# Patient Record
Sex: Female | Born: 1995 | Hispanic: Yes | Marital: Married | Smoking: Former smoker
Health system: Southern US, Community
[De-identification: ages and names within clinical notes are randomized; demographics above are authoritative.]

## PROBLEM LIST (undated history)

## (undated) ENCOUNTER — Inpatient Hospital Stay (HOSPITAL_COMMUNITY): Payer: Self-pay

## (undated) ENCOUNTER — Emergency Department (HOSPITAL_COMMUNITY): Admission: EM | Payer: Self-pay

## (undated) DIAGNOSIS — N12 Tubulo-interstitial nephritis, not specified as acute or chronic: Secondary | ICD-10-CM

## (undated) HISTORY — PX: ABDOMINOPLASTY: SUR9

## (undated) HISTORY — PX: DILATION AND CURETTAGE OF UTERUS: SHX78

---

## 2014-04-19 ENCOUNTER — Emergency Department (HOSPITAL_BASED_OUTPATIENT_CLINIC_OR_DEPARTMENT_OTHER)
Admission: EM | Admit: 2014-04-19 | Discharge: 2014-04-19 | Disposition: A | Discharge status: Home / Self Care | Payer: Self-pay | Attending: Emergency Medicine | Admitting: Emergency Medicine

## 2014-04-19 ENCOUNTER — Emergency Department (HOSPITAL_BASED_OUTPATIENT_CLINIC_OR_DEPARTMENT_OTHER): Payer: Self-pay

## 2014-04-19 ENCOUNTER — Encounter (HOSPITAL_BASED_OUTPATIENT_CLINIC_OR_DEPARTMENT_OTHER): Payer: Self-pay | Admitting: *Deleted

## 2014-04-19 DIAGNOSIS — R102 Pelvic and perineal pain: Secondary | ICD-10-CM | POA: Insufficient documentation

## 2014-04-19 DIAGNOSIS — Z3202 Encounter for pregnancy test, result negative: Secondary | ICD-10-CM | POA: Insufficient documentation

## 2014-04-19 LAB — BASIC METABOLIC PANEL
ANION GAP: 14 (ref 5–15)
BUN: 9 mg/dL (ref 6–23)
CALCIUM: 9.6 mg/dL (ref 8.4–10.5)
CO2: 23 mEq/L (ref 19–32)
Chloride: 103 mEq/L (ref 96–112)
Creatinine, Ser: 0.6 mg/dL (ref 0.50–1.10)
Glucose, Bld: 90 mg/dL (ref 70–99)
Potassium: 3.9 mEq/L (ref 3.7–5.3)
Sodium: 140 mEq/L (ref 137–147)

## 2014-04-19 LAB — WET PREP, GENITAL
TRICH WET PREP: NONE SEEN
WBC, Wet Prep HPF POC: NONE SEEN
Yeast Wet Prep HPF POC: NONE SEEN

## 2014-04-19 LAB — URINALYSIS, ROUTINE W REFLEX MICROSCOPIC
BILIRUBIN URINE: NEGATIVE
GLUCOSE, UA: NEGATIVE mg/dL
Hgb urine dipstick: NEGATIVE
Ketones, ur: NEGATIVE mg/dL
LEUKOCYTES UA: NEGATIVE
Nitrite: NEGATIVE
PROTEIN: NEGATIVE mg/dL
Specific Gravity, Urine: 1.012 (ref 1.005–1.030)
Urobilinogen, UA: 0.2 mg/dL (ref 0.0–1.0)
pH: 5.5 (ref 5.0–8.0)

## 2014-04-19 LAB — HCG, SERUM, QUALITATIVE: PREG SERUM: NEGATIVE

## 2014-04-19 LAB — CBC WITH DIFFERENTIAL/PLATELET
BASOS ABS: 0 10*3/uL (ref 0.0–0.1)
Basophils Relative: 0 % (ref 0–1)
EOS ABS: 0.1 10*3/uL (ref 0.0–0.7)
EOS PCT: 1 % (ref 0–5)
HCT: 40.6 % (ref 36.0–46.0)
Hemoglobin: 13.6 g/dL (ref 12.0–15.0)
Lymphocytes Relative: 31 % (ref 12–46)
Lymphs Abs: 2.2 10*3/uL (ref 0.7–4.0)
MCH: 31.3 pg (ref 26.0–34.0)
MCHC: 33.5 g/dL (ref 30.0–36.0)
MCV: 93.3 fL (ref 78.0–100.0)
MONO ABS: 0.5 10*3/uL (ref 0.1–1.0)
MONOS PCT: 7 % (ref 3–12)
NEUTROS ABS: 4.4 10*3/uL (ref 1.7–7.7)
Neutrophils Relative %: 61 % (ref 43–77)
Platelets: 160 10*3/uL (ref 150–400)
RBC: 4.35 MIL/uL (ref 3.87–5.11)
RDW: 12.1 % (ref 11.5–15.5)
WBC: 7.3 10*3/uL (ref 4.0–10.5)

## 2014-04-19 LAB — LIPASE, BLOOD: Lipase: 20 U/L (ref 11–59)

## 2014-04-19 LAB — PREGNANCY, URINE: Preg Test, Ur: NEGATIVE

## 2014-04-19 MED ORDER — AZITHROMYCIN 250 MG PO TABS
1000.0000 mg | ORAL_TABLET | Freq: Once | ORAL | Status: AC
  Administered 2014-04-19: 1000 mg via ORAL
  Filled 2014-04-19: qty 4

## 2014-04-19 MED ORDER — AZITHROMYCIN 250 MG PO TABS
ORAL_TABLET | ORAL | Status: AC
  Filled 2014-04-19: qty 1

## 2014-04-19 MED ORDER — CEFTRIAXONE SODIUM 1 G IJ SOLR
INTRAMUSCULAR | Status: AC
  Filled 2014-04-19: qty 10

## 2014-04-19 MED ORDER — CEFTRIAXONE SODIUM 1 G IJ SOLR
1.0000 g | Freq: Once | INTRAMUSCULAR | Status: AC
  Administered 2014-04-19: 1 g via INTRAVENOUS

## 2014-04-19 NOTE — ED Notes (Addendum)
Dizziness, nausea, vomiting and RLQ pain x  2 weeks

## 2014-04-19 NOTE — ED Provider Notes (Signed)
CSN: [id number]     Arrival date & time 04/19/14  1308 History   First MD Initiated Contact with Patient 04/19/14 1502     Chief Complaint  Patient presents with  . Abdominal Pain   Level 5 caveat- language barrier- history obtained through interpretor.   (Consider location/radiation/quality/duration/timing/severity/associated sxs/prior Treatment) HPI   18 year old female G1 P0 A1 presents today last menstrual period in September complaining of right lower quadrant pain for 2 weeks. She has had associated nausea, vomiting, and lightheadedness. She has had some abnormal vaginal discharge and complains of vaginal pain. The pain as in side. It is worse with movement and walking. She denies any fever or chills. She has vomited 1 time per day. She complains that she feels intermittently lightheaded. She feels like she has been eating more than usual. She has not noted any weight change. She denies urinary tract infection symptoms such as frequency of urination or pain with urination. Sexual partner. She has no history of sexually transmitted diseases. She voices concern that she feels that she must be pregnant. History reviewed. No pertinent past medical history. History reviewed. No pertinent past surgical history. No family history on file. History  Substance Use Topics  . Smoking status: Never Smoker   . Smokeless tobacco: Never Used  . Alcohol Use: Yes     Comment: occasional   OB History    No data available     Review of Systems  All other systems reviewed and are negative.     Allergies  Review of patient's allergies indicates no known allergies.  Home Medications   Prior to Admission medications   Not on File   BP 109/56 mmHg  Pulse 66  Temp(Src) 99 F (37.2 C) (Oral)  Resp 18  Wt 150 lb 6 oz (68.21 kg)  SpO2 99%  LMP 02/08/2014 Physical Exam  Constitutional: She is oriented to person, place, and time. She appears well-developed and well-nourished.  HENT:   Head: Normocephalic and atraumatic.  Right Ear: External ear normal.  Left Ear: External ear normal.  Nose: Nose normal.  Mouth/Throat: Oropharynx is clear and moist.  Eyes: Conjunctivae and EOM are normal. Pupils are equal, round, and reactive to light.  Neck: Normal range of motion. Neck supple. No JVD present. No tracheal deviation present. No thyromegaly present.  Cardiovascular: Normal rate, regular rhythm, normal heart sounds and intact distal pulses.   Pulmonary/Chest: Effort normal and breath sounds normal. She has no wheezes.  Abdominal: Soft. Bowel sounds are normal. She exhibits no mass. There is no tenderness. There is no guarding.  Genitourinary: Vagina normal and uterus normal. Cervix exhibits motion tenderness. Cervix exhibits no discharge and no friability. Right adnexum displays tenderness. No vaginal discharge found.  Musculoskeletal: Normal range of motion.  Lymphadenopathy:    She has no cervical adenopathy.  Neurological: She is alert and oriented to person, place, and time. She has normal reflexes. No cranial nerve deficit or sensory deficit. Gait normal. GCS eye subscore is 4. GCS verbal subscore is 5. GCS motor subscore is 6.  Reflex Scores:      Bicep reflexes are 2+ on the right side and 2+ on the left side.      Patellar reflexes are 2+ on the right side and 2+ on the left side. Strength is 5/5 bilateral elbow flexor/extensors, wrist extension/flexion, intrinsic hand strength equal Bilateral hip flexion/extension 5/5, knee flexion/extension 5/5, ankle 5/5 flexion extension    Skin: Skin is warm and dry.  Psychiatric:  She has a normal mood and affect. Her behavior is normal. Judgment and thought content normal.  Nursing note and vitals reviewed.   ED Course  Procedures (including critical care time) Labs Review Labs Reviewed  WET PREP, GENITAL - Abnormal; Notable for the following:    Clue Cells Wet Prep HPF POC FEW (*)    All other components within  normal limits  GC/CHLAMYDIA PROBE AMP  PREGNANCY, URINE  URINALYSIS, ROUTINE W REFLEX MICROSCOPIC  CBC WITH DIFFERENTIAL  BASIC METABOLIC PANEL  LIPASE, BLOOD  HCG, SERUM, QUALITATIVE    Imaging Review Koreas Transvaginal Non-ob  04/19/2014   CLINICAL DATA:  Right lower quadrant abdominal pain and nausea for 2 weeks; no menstrual periods since September; urine pregnancy test negative today; history of ovarian cysts during adolescence  EXAM: TRANSABDOMINAL AND TRANSVAGINAL ULTRASOUND OF PELVIS  TECHNIQUE: Both transabdominal and transvaginal ultrasound examinations of the pelvis were performed. Transabdominal technique was performed for global imaging of the pelvis including uterus, ovaries, adnexal regions, and pelvic cul-de-sac. It was necessary to proceed with endovaginal exam following the transabdominal exam to visualize the endometrium and ovaries.  COMPARISON:  None  FINDINGS: Uterus  Measurements: 7.9 x 3.3 x 3.9 cm. No fibroids or other mass visualized.  Endometrium  Thickness: 9.2 mm. Within the endometrium there is an anechoic focus with a questionable septation measuring 4 x 2 x 3 mm.  Right ovary  Measurements: 3.4 x 1.7 x 2.2 cm. Normal appearance/no adnexal mass.  Left ovary  Measurements: 3.3 x 1.8 x 1.2 cm. Evaluation of the left ovarian anatomy was limited due to the large amount of bowel gas present.  Other findings  No free fluid.  IMPRESSION: 1. Correlation with a serum beta HCG examination is needed to exclude possible early IUP. There is a tiny fluid-filled structure with a possible internal septation within the endometrial stripe. The endometrial stripe is not abnormally thickened. 2. The right ovary is normal in appearance. The left ovary was largely obscured by bowel gas. No definite adnexal masses are demonstrated. There is no free pelvic fluid. 3. If the patient's serum beta HCG is negative, further evaluation of the pelvis with CT scanning may be useful given the patient's right  lower quadrant pain and other symptoms. Followup pelvic ultrasound in 4-6 weeks can be considered to reassess the endometrium.   Electronically Signed   By: David  SwazilandJordan   On: 04/19/2014 16:52   [place] Pelvis Complete  04/19/2014   CLINICAL DATA:  Right lower quadrant abdominal pain and nausea for 2 weeks; no menstrual periods since September; urine pregnancy test negative today; history of ovarian cysts during adolescence  EXAM: TRANSABDOMINAL AND TRANSVAGINAL ULTRASOUND OF PELVIS  TECHNIQUE: Both transabdominal and transvaginal ultrasound examinations of the pelvis were performed. Transabdominal technique was performed for global imaging of the pelvis including uterus, ovaries, adnexal regions, and pelvic cul-de-sac. It was necessary to proceed with endovaginal exam following the transabdominal exam to visualize the endometrium and ovaries.  COMPARISON:  None  FINDINGS: Uterus  Measurements: 7.9 x 3.3 x 3.9 cm. No fibroids or other mass visualized.  Endometrium  Thickness: 9.2 mm. Within the endometrium there is an anechoic focus with a questionable septation measuring 4 x 2 x 3 mm.  Right ovary  Measurements: 3.4 x 1.7 x 2.2 cm. Normal appearance/no adnexal mass.  Left ovary  Measurements: 3.3 x 1.8 x 1.2 cm. Evaluation of the left ovarian anatomy was limited due to the large amount of bowel gas present.  Other findings  No free fluid.  IMPRESSION: 1. Correlation with a serum beta HCG examination is needed to exclude possible early IUP. There is a tiny fluid-filled structure with a possible internal septation within the endometrial stripe. The endometrial stripe is not abnormally thickened. 2. The right ovary is normal in appearance. The left ovary was largely obscured by bowel gas. No definite adnexal masses are demonstrated. There is no free pelvic fluid. 3. If the patient's serum beta HCG is negative, further evaluation of the pelvis with CT scanning may be useful given the patient's right lower quadrant pain  and other symptoms. Followup pelvic ultrasound in 4-6 weeks can be considered to reassess the endometrium.   Electronically Signed   By: David  SwazilandJordan   On: 04/19/2014 16:52     EKG Interpretation None      MDM   Final diagnoses:  Pelvic pain in female    Patient with negative urine pregnancy negative quantitative hCG. Other labs are normal. Ultrasound of the pelvis shows a tiny fluid-filled structure with a possible internal septation within the endometrial stripe. [facility]. This is explained to the patient and the patient's sister with a friend present through the interpreter and they voice understanding    Hilario Quarryanielle S Aengus Sauceda, MD 04/19/14 1743

## 2014-04-19 NOTE — Discharge Instructions (Signed)
Your pregnancy test is negative.  [facility] for repeat pregnancy test and reevaluation.   Dolor abdominal en las mujeres (Abdominal Pain, Women) El dolor abdominal (en el estmago, la pelvis o el vientre) puede tener muchas causas. Es importante que le informe a su mdico:  La ubicacin del Engineer, miningdolor.  Viene y se va, o persiste todo el tiempo?  Hay situaciones que Location managerinician el dolor (comer ciertos alimentos, la actividad fsica)?  Tiene otros sntomas asociados al dolor (fiebre, nuseas, vmitos, diarrea)? Todo es de gran ayuda cuando se trata de hallar la causa del dolor. CAUSAS  Estmago: Infecciones por virus o bacterias, o lcera.  Intestino: Apendicitis (apndice inflamado), ileitis regional (enfermedad de Crohn), colitis ulcerosa (colon inflamado), sndrome del colon irritable, diverticulitis (inflamacin de los divertculos del colon) o cncer de estmago oo intestino.  Enfermedades de la vescula biliar o clculos.  Enfermedades renales, clculos o infecciones en el rin.  Infeccin o cncer del pncreas.  Fibromialgia (trastorno doloroso)  Enfermedades de los rganos femeninos:  Uterus: tero: fibroma (tumor no canceroso) o infeccin  Trompas de Falopio: infeccin o embarazo ectpico  En los ovarios, quistes o tumores.  Adherencias plvicas (tejido cicatrizal).  Endometriosis (el tejido que cubre el tero se desarrolla en la pelvis y los rganos plvicos).  Sndrome de Agricultural engineercongestin plvica (los rganos femeninos se llenan de sangre antes del periodo menstrual(  Dolor durante el periodo menstrual.  Dolor durante la ovulacin (al producir vulos).  Dolor al usar el DIU (dispositivo intrauterino para el control de la natalidad)  Psychologist, clinicalCncer en los rganos femeninos.  Dolor funcional (no est originado en una enfermedad, puede mejorar sin tratamiento).  Dolor de origen psicolgico  Depresin. DIAGNSTICO Su mdico decidir la  gravedad del dolor a travs del examen fsico  Anlisis de sangre  Radiografas  Ecografas  TC (tomografa computada, tipo especial de radiografas).  IMR (resonancia magntica)  Cultivos, en el caso una infeccin  Colon por enema de bario (se inserta una sustancia de contraste en el intestino grueso para mejorar la observacin con rayos X.)  Colonoscopa (observacin del intestino con un tubo luminoso).  Laparoscopa (examen del interior del abdomen con un tubo que tiene Intel Corporationuna luz).  Ciruga exploratoria abdominal mayor (se observa el abdomen realizando una gran incisin). TRATAMIENTO El tratamiento depender de la causa del problema.   Muchos de estos casos pueden controlarse y tratarse en casa.  Medicamentos de venta libre indicados por el mdico.  Medicamentos con receta.  Antibiticos, en caso de infeccin  Pldoras anticonceptivas, en el caso de perodos dolorosos o dolor al ovular.  Tratamiento hormonal, para la endometriosis  Inyecciones para bloqueo nervioso selectivo.  Fisioterapia.  Antidepresivos.  Consejos por parte de un psclogo o psiquiatra.  Ciruga mayor o menor. INSTRUCCIONES PARA EL CUIDADO DOMICILIARIO  No tome ni administre laxantes a menos que se lo haya indicado su mdico.  Tome analgsicos de venta libre slo si se lo ha indicado el profesional que lo asiste. No tome aspirina, ya que puede causar Apple Computermolestias en el estmago o hemorragias.  Consuma una dieta lquida (caldo o agua) segn lo indicado por el mdico. Progrese lentamente a una dieta blanda, segn la tolerancia, si el dolor se relaciona con el estmago o el intestino.  Tenga un termmetro y tmese la temperatura varias veces al da.  Haga reposo en la cama y Steele Creekduerma, si esto Research scientist (life sciences)alivia el dolor.  Evite las relaciones sexuales, Counsellorsi le producen dolor.  Evite las  situaciones estresantes.  Cumpla con las visitas y los anlisis de control, segn las indicaciones de su mdico.  Si el  dolor no se Burkina Fasoalivia con los medicamentos o la Wanamingociruga, Delawarepuede tratar con:  Acupuntura.  Ejercicios de relajacin (yoga, meditacin).  Terapia grupal.  Psicoterapia. SOLICITE ATENCIN MDICA SI:  Nota que ciertos Pharmacist, communityalimentos le producen dolor de Seventh Mountainestmago.  El tratamiento indicado para Arboriculturistrealizar en el hogar no Marketing executivele alivia el dolor.  Necesita analgsicos ms fuertes.  Quiere que le retiren el DIU.  Si se siente confundido o desfalleciente.  Presenta nuseas o vmitos.  Aparece una erupcin cutnea.  Sufre efectos adversos o una reaccin alrgica debido a los medicamentos que toma. SOLICITE ATENCIN MDICA DE INMEDIATO SI:  El dolor persiste o se agrava.  Tiene fiebre.  Siente el dolor slo en algunos sectores del abdomen. Si se localiza en la zona derecha, posiblemente podra tratarse de apendicitis. En un adulto, si se localiza en la regin inferior izquierda del abdomen, podra tratarse de colitis o diverticulitis.  Hay sangre en las heces (deposiciones de color rojo brillante o negro alquitranado), con o sin vmitos.  Usted presenta sangre en la orina.  Siente escalofros con o sin fiebre.  Se desmaya. ASEGRESE QUE:   Comprende estas instrucciones.  Controlar su enfermedad.  Solicitar ayuda de inmediato si no mejora o si empeora. Document Released: 08/26/2008 Document Revised: 08/02/2011 [facility] Patient Information 2015 [place]ExitCare, [place]LL[place]. This information is not intended to replace advice given to you by your health care provider. Make sure you discuss any questions you have with your health care provider.

## 2014-04-20 LAB — GC/CHLAMYDIA PROBE AMP
CT Probe RNA: NEGATIVE
GC PROBE AMP APTIMA: NEGATIVE

## 2015-09-15 ENCOUNTER — Emergency Department (HOSPITAL_COMMUNITY)
Admission: EM | Admit: 2015-09-15 | Discharge: 2015-09-15 | Disposition: A | Discharge status: Home / Self Care | Payer: Self-pay

## 2015-09-15 NOTE — ED Notes (Signed)
No answer x3

## 2015-09-16 ENCOUNTER — Emergency Department (HOSPITAL_COMMUNITY): Payer: Self-pay

## 2015-09-16 ENCOUNTER — Emergency Department (HOSPITAL_COMMUNITY)
Admission: EM | Admit: 2015-09-16 | Discharge: 2015-09-16 | Disposition: A | Discharge status: Home / Self Care | Payer: Self-pay | Attending: Emergency Medicine | Admitting: Emergency Medicine

## 2015-09-16 ENCOUNTER — Encounter (HOSPITAL_COMMUNITY): Payer: Self-pay

## 2015-09-16 DIAGNOSIS — J069 Acute upper respiratory infection, unspecified: Secondary | ICD-10-CM

## 2015-09-16 DIAGNOSIS — R112 Nausea with vomiting, unspecified: Secondary | ICD-10-CM | POA: Insufficient documentation

## 2015-09-16 DIAGNOSIS — Z3202 Encounter for pregnancy test, result negative: Secondary | ICD-10-CM | POA: Insufficient documentation

## 2015-09-16 DIAGNOSIS — J029 Acute pharyngitis, unspecified: Secondary | ICD-10-CM

## 2015-09-16 DIAGNOSIS — R110 Nausea: Secondary | ICD-10-CM

## 2015-09-16 LAB — I-STAT BETA HCG BLOOD, ED (MC, WL, AP ONLY): I-stat hCG, quantitative: <5 m[IU]/mL (ref <5)

## 2015-09-16 LAB — RAPID STREP SCREEN (MED CTR MEBANE ONLY): Streptococcus, Group A Screen (Direct): NEGATIVE

## 2015-09-16 MED ORDER — ONDANSETRON 4 MG PO TBDP: 4.0000 mg | ORAL_TABLET | Freq: Three times a day (TID) | ORAL | Status: DC | PRN

## 2015-09-16 MED ORDER — CETIRIZINE HCL 10 MG PO TABS: 10.0000 mg | ORAL_TABLET | Freq: Every day | ORAL | Status: DC

## 2015-09-16 MED ORDER — NAPROXEN 250 MG PO TABS: 250.0000 mg | ORAL_TABLET | Freq: Two times a day (BID) | ORAL | Status: DC

## 2015-09-16 MED ORDER — FLUTICASONE PROPIONATE 50 MCG/ACT NA SUSP: 2.0000 | Freq: Every day | NASAL | Status: DC

## 2015-09-16 NOTE — ED Notes (Addendum)
Pt with cough, sore throat, headache since Friday.  Unknown for fever.  No shortness of breath.  Cough with sputum and slight blood tinged  X 1.  Vomiting x 2 - 3 days ago.  No vomiting today.  Pt states her primary reason for being here today is the cough.  Denies abdominal pain, nausea or vomiting today.

## 2015-09-16 NOTE — Discharge Instructions (Signed)
Infeccin del tracto respiratorio superior, adultos (Upper Respiratory Infection, Adult) La mayora de las infecciones del tracto respiratorio superior son infecciones virales de las vas que llevan el aire a los pulmones. Un infeccin del tracto respiratorio superior afecta la nariz, la garganta y las vas respiratorias superiores. El tipo ms frecuente de infeccin del tracto respiratorio superior es la nasofaringitis, que habitualmente se conoce como "resfro comn". Las infecciones del tracto respiratorio superior siguen su curso y por lo general se curan solas. En la [employer], la infeccin del tracto respiratorio superior no requiere atencin [place], Armed forces training and education officer a veces, despus de una infeccin viral, puede surgir una infeccin bacteriana en las vas respiratorias superiores. Esto se conoce como infeccin secundaria. Las infecciones sinusales y en el odo medio son tipos frecuentes de infecciones secundarias en el tracto respiratorio superior. La neumona bacteriana tambin puede complicar un cuadro de infeccin del tracto respiratorio superior. Este tipo de infeccin puede empeorar el asma y la enfermedad pulmonar obstructiva crnica (EPOC). En algunos casos, estas complicaciones pueden requerir atencin mdica de emergencia y poner en peligro la vida.  CAUSAS Casi todas las infecciones del tracto respiratorio superior se deben a los virus. Un virus es un tipo de microbio que puede contagiarse de Ardelia Mems persona a Theatre manager.  FACTORES DE RIESGO Puede estar en riesgo de sufrir una infeccin del tracto respiratorio superior si:   Fuma.  Tiene una enfermedad pulmonar o cardaca crnica.  Tiene debilitado el sistema de defensa (inmunitario) del cuerpo.  Es muy joven o de edad muy [place].  Tiene asma o alergias nasales.  Trabaja en reas donde hay mucha gente o poca ventilacin.  Governor Rooks en una escuela o en un centro de atencin mdica. SIGNOS Y SNTOMAS  Habitualmente, los sntomas aparecen  de 2a 3das despus de entrar en contacto con el virus del resfro. La mayora de las infecciones virales en el tracto respiratorio superior duran de 7a 10das. Sin embargo, las infecciones virales en el tracto respiratorio superior a causa del virus de la gripe pueden durar de 14a 18das y, habitualmente, son ms graves. Entre los sntomas se pueden incluir los siguientes:   Secrecin o congestin nasal.  Estornudos.  Tos.  Dolor de Investment banker, operational.  Dolor de [place].  Fatiga.  Cristy Hilts.  Prdida del apetito.  Dolor en la frente, detrs de los ojos y por encima de los pmulos (dolor sinusal).  Dolores musculares. DIAGNSTICO  El mdico puede diagnosticar una infeccin del tracto respiratorio superior mediante los siguientes estudios:  Examen fsico.  Pruebas para verificar si los sntomas no se deben a otra afeccin, por ejemplo:  Faringitis estreptoccica.  Sinusitis.  Neumona.  Asma. TRATAMIENTO  Esta infeccin desaparece sola, con el tiempo. No puede curarse con medicamentos, pero a menudo se prescriben para aliviar los sntomas. Los medicamentos pueden ser tiles para lo siguiente:  Engineer, materials fiebre.  Reducir la tos.  Aliviar la congestin nasal. INSTRUCCIONES PARA EL CUIDADO EN EL HOGAR   Tome los medicamentos solamente como se lo haya indicado el mdico.  A fin de Best boy de garganta, haga grgaras con solucin salina templada o consuma caramelos para la tos, como se lo haya indicado el mdico.  Use un humidificador de vapor clido o inhale el vapor de la ducha para aumentar la humedad del aire. Esto facilitar la respiracin.  Beba suficiente lquido para Consulting civil engineer orina clara o de color amarillo plido.  Consuma sopas y otros caldos transparentes, y [employer].  Descanse todo lo  que sea necesario.  Regrese al Aleen Campitrabajo cuando la temperatura se le haya normalizado o cuando el mdico lo autorice. Es posible que deba quedarse en su casa durante  un tiempo prolongado, para no infectar a los dems. Tambin puede usar un barbijo y lavarse las manos con cuidado para Transport plannerevitar la propagacin del virus.  Aumente el uso del inhalador si tiene asma.  No consuma ningn producto que contenga tabaco, lo que incluye cigarrillos, tabaco de Theatre managermascar o Administrator, Civil Servicecigarrillos electrnicos. Si necesita ayuda para dejar de fumar, consulte al American Expressmdico. PREVENCIN  La mejor manera de protegerse de un resfro es mantener una higiene Jacksonadecuada.   Evite el contacto oral o fsico con personas que tengan sntomas de resfro.  En caso de contacto, lvese las manos con frecuencia. No hay pruebas claras de que la vitaminaC, la vitaminaE, la equincea o el ejercicio reduzcan la probabilidad de Primary school teachercontraer un resfro. Sin embargo, siempre se recomienda Insurance account managerdescansar mucho, hacer ejercicio y Engineering geologistalimentarse bien.  SOLICITE ATENCIN MDICA SI:   Su estado empeora en lugar de mejorar.  Los medicamentos no Estate agentlogran controlar los sntomas.  Tiene escalofros.  La sensacin de falta de aire empeora.  Tiene mucosidad marrn o roja.  Tiene secrecin nasal amarilla o marrn.  Le duele la cara, especialmente al inclinarse hacia adelante.  Tiene fiebre.  Tiene los ganglios del cuello hinchados.  Siente dolor al tragar.  Tiene zonas blancas en la parte de atrs de la garganta. SOLICITE ATENCIN MDICA DE INMEDIATO SI:   Tiene sntomas intensos o persistentes de:  Dolor de Turkmenistancabeza.  Dolor de odos.  Dolor sinusal.  Dolor en el pecho.  Tiene enfermedad pulmonar crnica y cualquiera de estos sntomas:  Sibilancias.  Tos prolongada.  Tos con sangre.  Cambio en la mucosidad habitual.  Presenta rigidez en el cuello.  Tiene cambios en:  La visin.  La audicin.  El pensamiento.  El Roverestado de nimo. ASEGRESE DE QUE:   Comprende estas instrucciones.  Controlar su afeccin.  Recibir ayuda de inmediato si no mejora o si empeora.   Esta informacin no tiene Public house managercomo  fin reemplazar el consejo del mdico. Asegrese de hacerle al mdico cualquier pregunta que tenga.   Document Released: 02/17/2005 Document Revised: 09/24/2014 Elsevier Interactive Patient Education 2016 [employer] Faringitis (Pharyngitis) La faringitis ocurre cuando la faringe presenta enrojecimiento, dolor e hinchazn (inflamacin).  CAUSAS  Normalmente, la faringitis se debe a una infeccin. Generalmente, estas infecciones ocurren debido a virus (viral) y se presentan cuando las personas se resfran. Sin embargo, a Advertising account executiveveces la faringitis es provocada por bacterias (bacteriana). Las alergias tambin pueden ser una causa de la faringitis. La faringitis viral se puede contagiar de Neomia Dearuna persona a otra al toser, estornudar y compartir objetos o utensilios personales (tazas, tenedores, cucharas, cepillos de diente). La faringitis bacteriana se puede contagiar de Neomia Dearuna persona a otra a travs de un contacto ms ntimo, como besar.  SIGNOS Y SNTOMAS  Los sntomas de la faringitis incluyen los siguientes:   Dolor de Advertising copywritergarganta.  Cansancio (fatiga).  Fiebre no muy elevada.  Dolor de Turkmenistancabeza.  Dolores musculares y en las articulaciones.  Erupciones cutneas  Ganglios linfticos hinchados.  Una pelcula parecida a las placas en la garganta o las amgdalas (frecuente con la faringitis bacteriana). DIAGNSTICO  El mdico le har preguntas sobre la enfermedad y sus sntomas. Normalmente, todo lo que se necesita para diagnosticar una faringitis son sus antecedentes mdicos y un examen fsico. A veces se realiza una prueba rpida para estreptococos.  Tambin es posible que se realicen otros anlisis de laboratorio, segn la posible causa.  TRATAMIENTO  La faringitis viral normalmente mejorar en un plazo de 3 a 4das sin medicamentos. La faringitis bacteriana se trata con medicamentos que [employer] grmenes (antibiticos).  INSTRUCCIONES PARA EL CUIDADO EN EL HOGAR   Beba gran cantidad de lquido para  mantener la orina de tono claro o color amarillo plido.  Tome solo medicamentos de venta libre o recetados, segn las indicaciones del mdico.  Si le receta antibiticos, asegrese de terminarlos, incluso si comienza a Actor.  No tome aspirina.  Descanse lo suficiente.  Hgase grgaras con 8onzas ( ) de agua con sal (cucharadita de sal por litro de agua) cada 1 o 2horas para Science writer.  Puede usar pastillas (si no corre riesgo de Health visitor) o aerosoles para Science writer. SOLICITE ATENCIN MDICA SI:   Tiene bultos grandes y dolorosos en el cuello.  Tiene una erupcin cutnea.  Cuando tose elimina una expectoracin verde, amarillo amarronado o con [place]. SOLICITE ATENCIN MDICA DE INMEDIATO SI:   El cuello se pone rgido.  Comienza a babear o no puede tragar lquidos.  Vomita o no puede retener los [employer] lquidos.  Siente un dolor intenso que no se alivia con los medicamentos recomendados.  Tiene dificultades para respirar (y no debido a la nariz tapada). ASEGRESE DE QUE:   Comprende estas instrucciones.  Controlar su afeccin.  Recibir ayuda de inmediato si no mejora o si empeora.   Esta informacin no tiene Theme park manager el consejo del mdico. Asegrese de hacerle al mdico cualquier pregunta que tenga.   Document Released: 02/17/2005 Document Revised: 02/28/2013 Elsevier Interactive Patient Education [employer]

## 2015-09-16 NOTE — ED Provider Notes (Signed)
CSN: [id number]     Arrival date & time 09/16/15  1218 History  By signing my name below, I, Freida Busman, attest that this documentation has been prepared under the direction and in the presence of non-physician practitioner, Everlene Farrier, PA-C. Electronically Signed: Freida Busman, Scribe. 09/16/2015. 2:17 PM.    Chief Complaint  Patient presents with  . Sore Throat  . Cough    The history is provided by the patient. The history is limited by a language barrier. A language interpreter was used.   HPI Comments:  Paula Gamble is a 20 y.o. female who presents to the Emergency Department complaining of persistent cough x 5 days. Pt reports associated sore throat, nasal congestion, sneezing, and rhinorrhea. She also reports some intermittent nausea and vomiting. She last vomited last night.  No abdominal pain. She denies SOB, fever, postnasal drip, diarrhea, vaginal bleeding/discharge and abdominal pain. No alleviating factors noted. Pt does not speak english. LNMP 08/06/15.   History reviewed. No pertinent past medical history. History reviewed. No pertinent past surgical history. History reviewed. No pertinent family history. Social History  Substance Use Topics  . Smoking status: Never Smoker   . Smokeless tobacco: Never Used  . Alcohol Use: Yes     Comment: occasional   OB History    No data available     Review of Systems  Constitutional: Negative for fever and chills.  HENT: Positive for congestion, rhinorrhea, sneezing and sore throat. Negative for drooling, ear pain, mouth sores, postnasal drip and trouble swallowing.   Eyes: Negative for visual disturbance.  Respiratory: Positive for cough. Negative for shortness of breath and wheezing.   Cardiovascular: Negative for chest pain.  Gastrointestinal: Positive for nausea and vomiting. Negative for abdominal pain and diarrhea.  Genitourinary: Negative for dysuria, urgency, frequency and difficulty urinating.   Musculoskeletal: Negative for neck pain and neck stiffness.  Skin: Negative for rash.  Neurological: Negative for syncope, weakness and headaches.   Allergies  Review of patient's allergies indicates no known allergies.  Home Medications   Prior to Admission medications   Medication Sig Start Date End Date Taking? Authorizing Provider  cetirizine (ZYRTEC ALLERGY) 10 MG tablet Take 1 tablet (10 mg total) by mouth daily. 09/16/15   Everlene Farrier, PA-C  fluticasone (FLONASE) 50 MCG/ACT nasal spray Place 2 sprays into both nostrils daily. 09/16/15   Everlene Farrier, PA-C  naproxen (NAPROSYN) 250 MG tablet Take 1 tablet (250 mg total) by mouth 2 (two) times daily with a meal. 09/16/15   Everlene Farrier, PA-C  ondansetron (ZOFRAN ODT) 4 MG disintegrating tablet Take 1 tablet (4 mg total) by mouth every 8 (eight) hours as needed for nausea or vomiting. 09/16/15   Everlene Farrier, PA-C   BP 107/59 mmHg  Pulse 73  Temp(Src) 98.7 F (37.1 C) (Oral)  Resp 18  Wt 68.04 kg  SpO2 96%  LMP 08/06/2015 Physical Exam  Constitutional: She appears well-developed and well-nourished. No distress.  Nontoxic appearing.  HENT:  Head: Normocephalic and atraumatic.  Right Ear: Tympanic membrane and external ear normal.  Left Ear: Tympanic membrane and external ear normal.  Mouth/Throat: Uvula is midline. No trismus in the jaw. Posterior oropharyngeal erythema present. No oropharyngeal exudate.  Mild bilateral tonsillar hypertrophy without exudate  Mild posterior oropharyngeal erythema and cobblestoning with evidence of postnasal drip No trismus, drooling, or peritonsillar abscess Boggy nasal turbinates bilaterally  Bilateral tympanic membranes are pearly-gray without erythema or loss of landmarks.   Eyes: Conjunctivae are normal. Pupils  are equal, round, and reactive to light. Right eye exhibits no discharge. Left eye exhibits no discharge.  Neck: Normal range of motion. Neck supple. No JVD present. No  tracheal deviation present.  Cardiovascular: Normal rate, regular rhythm, normal heart sounds and intact distal pulses.   Pulmonary/Chest: Effort normal and breath sounds normal. No stridor. No respiratory distress. She has no wheezes. She has no rales.  Lungs are clear to auscultation bilaterally.  Abdominal: Soft. Bowel sounds are normal. She exhibits no distension. There is no tenderness. There is no rebound and no guarding.  Abdomen is soft and nontender to palpation. No peritoneal signs.  Musculoskeletal: Normal range of motion. She exhibits no edema.  Lymphadenopathy:    She has no cervical adenopathy.  Neurological: She is alert. Coordination normal.  Skin: Skin is warm and dry. No rash noted. She is not diaphoretic. No erythema. No pallor.  Psychiatric: She has a normal mood and affect. Her behavior is normal.  Nursing note and vitals reviewed.   ED Course  Procedures DIAGNOSTIC STUDIES:  Oxygen Saturation is 96% on RA, normal by my interpretation.    COORDINATION OF CARE:  2:11 PM Discussed treatment plan with pt at bedside and pt agreed to plan.  Labs Review Labs Reviewed  R[facility] ST[facility] SCREEN [facility])  CULTURE, GROUP A STREP ([facility])  I-STAT BETA HCG BLOOD, ED ([facility], [facility], [facility] ONLY)    Imaging Review Dg Chest 2 View  09/16/2015  CLINICAL DATA:  Cough EXAM: CHEST  2 VIEW COMPARISON:  None. FINDINGS: Cardiomediastinal silhouette is unremarkable. No acute infiltrate or pleural effusion. No pulmonary edema. Bony thorax is unremarkable. IMPRESSION: No active cardiopulmonary disease. Electronically Signed   By: Natasha MeadLiviu  Pop M.D.   On: 09/16/2015 13:24   I have personally reviewed and evaluated these images and lab results as part of my medical decision-making.   EKG Interpretation None      Filed Vitals:   09/16/15 1249  BP: 107/59  Pulse: 73  Temp: 98.7 F (37.1 C)  TempSrc: Oral  Resp: 18  Weight: 68.04 kg  SpO2: 96%     MDM   Meds given in ED:  Medications  - No data to display  New Prescriptions   CETIRIZINE (ZYRTEC ALLERGY) 10 MG TABLET    Take 1 tablet (10 mg total) by mouth daily.   FLUTICASONE (FLONASE) 50 MCG/ACT NASAL SPRAY    Place 2 sprays into both nostrils daily.   NAPROXEN (NAPROSYN) 250 MG TABLET    Take 1 tablet (250 mg total) by mouth 2 (two) times daily with a meal.   ONDANSETRON (ZOFRAN ODT) 4 MG DISINTEGRATING TABLET    Take 1 tablet (4 mg total) by mouth every 8 (eight) hours as needed for nausea or vomiting.    Final diagnoses:  URI (upper respiratory infection)  Sore throat  Nausea   This  is a 20 y.o. female who presents to the Emergency Department complaining of persistent cough x 5 days. Pt reports associated sore throat, nasal congestion, sneezing, and rhinorrhea. She also reports some intermittent nausea and vomiting. She last vomited last night.  No abdominal pain. She denies SOB, fever.  On exam the patient is afebrile and nontoxic appearing. She has boggy nasal turbinates bilaterally. She is mild bilateral tonsillar aperture without exudates. She is evidence of postnasal drip and her oropharynx. No drooling. No peritonsillar abscess. Abdomen is soft and nontender to palpation. Lungs are clear to auscultation bilaterally. Chest x-ray is unremarkable. Rapid  strep is negative. Patient tells me she is late on her menstrual cycle by about 2 weeks. Will check an i-STAT hCG. This could contribute to her nausea. However, I suspect this is more related to her postnasal drip. I-STAT hCG is negative. At re-evaluation patient is tolerating water and graham crackers. Will discharge with prescriptions for naproxen, Flonase, Zyrtec and Zofran for any further nausea. Her abdomen is soft and nontender to palpation. I suspect the nausea is more related to her postnasal drip. I discussed return precautions. I advised the patient to follow-up with their primary care provider this week. I advised the patient to return to the emergency  department with new or worsening symptoms or new concerns. The patient verbalized understanding and agreement with plan.    I personally performed the services described in this documentation, which was scribed in my presence. The recorded information has been reviewed and is accurate.        Everlene Farrier, PA-C 09/16/15 1522  Jerelyn Scott, MD 09/16/15 [id number]

## 2015-09-19 LAB — CULTURE, GROUP A STREP (THRC)

## 2015-11-14 ENCOUNTER — Ambulatory Visit (INDEPENDENT_AMBULATORY_CARE_PROVIDER_SITE_OTHER): Payer: Self-pay

## 2015-11-14 DIAGNOSIS — Z3202 Encounter for pregnancy test, result negative: Secondary | ICD-10-CM

## 2015-11-14 LAB — POCT PREGNANCY, URINE
PREG TEST UR: NEGATIVE
Preg Test, Ur: NEGATIVE

## 2015-11-14 NOTE — Progress Notes (Signed)
Patient comes into the clinic today for a pregnancy test. Test is negative at this time. Patient stated she has miss her period for three months now and wanted to know why. I explained to patient we would need to make her an appointment so that this can be discuss with a provider. Pt verbalizes understanding and she will follow up with an appointment at our office.

## 2015-12-05 ENCOUNTER — Encounter (HOSPITAL_COMMUNITY): Payer: Self-pay | Admitting: Obstetrics and Gynecology

## 2015-12-05 ENCOUNTER — Inpatient Hospital Stay (HOSPITAL_COMMUNITY)
Admission: AD | Admit: 2015-12-05 | Discharge: 2015-12-05 | Disposition: A | Discharge status: Home / Self Care | Payer: Self-pay | Source: Ambulatory Visit | Attending: Obstetrics & Gynecology | Admitting: Obstetrics & Gynecology

## 2015-12-05 DIAGNOSIS — N760 Acute vaginitis: Secondary | ICD-10-CM

## 2015-12-05 DIAGNOSIS — B373 Candidiasis of vulva and vagina: Secondary | ICD-10-CM

## 2015-12-05 DIAGNOSIS — B9689 Other specified bacterial agents as the cause of diseases classified elsewhere: Secondary | ICD-10-CM

## 2015-12-05 DIAGNOSIS — B3731 Acute candidiasis of vulva and vagina: Secondary | ICD-10-CM

## 2015-12-05 DIAGNOSIS — N12 Tubulo-interstitial nephritis, not specified as acute or chronic: Secondary | ICD-10-CM | POA: Insufficient documentation

## 2015-12-05 DIAGNOSIS — R102 Pelvic and perineal pain: Secondary | ICD-10-CM

## 2015-12-05 LAB — POCT PREGNANCY, URINE: Preg Test, Ur: NEGATIVE

## 2015-12-05 LAB — CBC
HEMATOCRIT: 39.5 % (ref 36.0–46.0)
HEMOGLOBIN: 13.1 g/dL (ref 12.0–15.0)
MCH: 30.7 pg (ref 26.0–34.0)
MCHC: 33.2 g/dL (ref 30.0–36.0)
MCV: 92.5 fL (ref 78.0–100.0)
Platelets: 195 10*3/uL (ref 150–400)
RBC: 4.27 MIL/uL (ref 3.87–5.11)
RDW: 13 % (ref 11.5–15.5)
WBC: 13.9 10*3/uL — ABNORMAL HIGH (ref 4.0–10.5)

## 2015-12-05 LAB — URINALYSIS, ROUTINE W REFLEX MICROSCOPIC
Bilirubin Urine: NEGATIVE
GLUCOSE, UA: NEGATIVE mg/dL
Ketones, ur: NEGATIVE mg/dL
Nitrite: NEGATIVE
PH: 6 (ref 5.0–8.0)
Protein, ur: 30 mg/dL — AB
Specific Gravity, Urine: 1.01 (ref 1.005–1.030)

## 2015-12-05 LAB — WET PREP, GENITAL
SPERM: NONE SEEN
TRICH WET PREP: NONE SEEN

## 2015-12-05 LAB — URINE MICROSCOPIC-ADD ON

## 2015-12-05 MED ORDER — CIPROFLOXACIN HCL 500 MG PO TABS: 500.0000 mg | ORAL_TABLET | Freq: Two times a day (BID) | ORAL | Status: DC

## 2015-12-05 MED ORDER — METRONIDAZOLE 500 MG PO TABS: 500.0000 mg | ORAL_TABLET | Freq: Two times a day (BID) | ORAL | Status: DC

## 2015-12-05 MED ORDER — CEFTRIAXONE SODIUM 1 G IJ SOLR
1.0000 g | Freq: Once | INTRAMUSCULAR | Status: AC
  Administered 2015-12-05: 1 g via INTRAMUSCULAR
  Filled 2015-12-05: qty 10

## 2015-12-05 MED ORDER — FLUCONAZOLE 150 MG PO TABS: 150.0000 mg | ORAL_TABLET | Freq: Once | ORAL | Status: DC

## 2015-12-05 MED ORDER — KETOROLAC TROMETHAMINE 60 MG/2ML IM SOLN
60.0000 mg | Freq: Once | INTRAMUSCULAR | Status: AC
  Administered 2015-12-05: 60 mg via INTRAMUSCULAR
  Filled 2015-12-05: qty 2

## 2015-12-05 MED ORDER — IBUPROFEN 600 MG PO TABS: 600.0000 mg | ORAL_TABLET | Freq: Four times a day (QID) | ORAL | Status: DC | PRN

## 2015-12-05 NOTE — MAU Provider Note (Signed)
History     CSN: [id number]  Arrival date and time: 12/05/15 [id number]   First Provider Initiated Contact with Patient 12/05/15 1935    Spanish interpreter present  Chief Complaint  Patient presents with  . Flank Pain   HPI Comments: Paula Gamble is a 20 y.o. [id number] presenting with  1 d hx of constant aching pain across lower back radiating to left groin and suprapubic region. She has dysuria, urgency and frequency of urination. She has 1 sexual partner who is new to her. Reports irritative, pruritic vaginal discharge.    Flank Pain This is a new problem. The current episode started yesterday. The problem occurs constantly. The problem has been gradually worsening since onset. The pain is present in the lumbar spine (also left flank and suprapubic). The quality of the pain is described as aching and stabbing. Radiates to: LLQ and suprapubic. The pain is at a severity of 9/10. The pain is severe. The pain is the same all the time. The symptoms are aggravated by twisting. Associated symptoms include abdominal pain and dysuria. Pertinent negatives include no bladder incontinence, fever, headaches or weakness. (Urinary frequency "50" times a day; urinary urgency. Denies hematuria) She has tried analgesics (acetaminophen) for the symptoms. The treatment provided mild relief.    OB History  Gravida Para Term Preterm AB SAB TAB Ectopic Multiple Living  2    2 2         # Outcome Date GA Lbr Len/2nd Weight Sex Delivery Anes PTL Lv  2 SAB           1 SAB                 Past Medical History  Diagnosis Date  . Medical history non-contributory   Neg for kidney stones or UTIs  Past Surgical History  Procedure Laterality Date  . No past surgeries      History reviewed. No pertinent family history.  Social History  Substance Use Topics  . Smoking status: Never Smoker   . Smokeless tobacco: Never Used  . Alcohol Use: Yes     Comment: occasional    Allergies: No Known  Allergies  Prescriptions prior to admission  Medication Sig Dispense Refill Last Dose  . cetirizine (ZYRTEC ALLERGY) 10 MG tablet Take 1 tablet (10 mg total) by mouth daily. (Patient not taking: Reported on 11/14/2015) 30 tablet 1 Not Taking  . fluticasone (FLONASE) 50 MCG/ACT nasal spray Place 2 sprays into both nostrils daily. (Patient not taking: Reported on 11/14/2015) 16 g 0 Not Taking  . naproxen (NAPROSYN) 250 MG tablet Take 1 tablet (250 mg total) by mouth 2 (two) times daily with a meal. (Patient not taking: Reported on 11/14/2015) 30 tablet 0 Not Taking  . ondansetron (ZOFRAN ODT) 4 MG disintegrating tablet Take 1 tablet (4 mg total) by mouth every 8 (eight) hours as needed for nausea or vomiting. (Patient not taking: Reported on 11/14/2015) 10 tablet 0 Not Taking    Review of Systems  Constitutional: Positive for malaise/fatigue. Negative for fever, chills and diaphoresis.  Gastrointestinal: Positive for abdominal pain.  Genitourinary: Positive for dysuria and flank pain. Negative for bladder incontinence.  Neurological: Negative for weakness and headaches.   Physical Exam   Blood pressure 119/68, pulse 74, temperature 98.7 F (37.1 C), resp. rate 18, height 5\' 3"  (1.6 m), weight 71.251 kg (157 lb 1.3 oz), last menstrual period 11/10/2015.  Physical Exam  Nursing note and vitals reviewed. Constitutional: She is oriented to  person, place, and time. She appears well-developed and well-nourished. She appears distressed.  Apparent pain with repositioning  HENT:  Head: Normocephalic.  Eyes: Pupils are equal, round, and reactive to light.  Neck: Normal range of motion.  Cardiovascular: Normal rate.   Respiratory: Effort normal.  GI: Soft. There is no tenderness.  Genitourinary: Uterus normal. Vaginal discharge found.  White-gray scanty discharge  Musculoskeletal: Normal range of motion.  Diffuse TTP left and right lower back Left CVAT  Neurological: She is alert and oriented to  person, place, and time.  Skin: Skin is warm and dry.  Psychiatric: She has a normal mood and affect. Her behavior is normal.    MAU Course  Procedures Results for orders placed or performed during the hospital encounter of 12/05/15 (from the past 24 hour(s))  Urinalysis, Routine w reflex microscopic (not at [facility])     Status: Abnormal   Collection Time: 12/05/15  7:23 PM  Result Value Ref Range   Color, Urine YELLOW YELLOW   APPearance CLOUDY (A) CLEAR   Specific Gravity, Urine 1.010 1.005 - 1.030   pH 6.0 5.0 - 8.0   Glucose, UA NEGATIVE NEGATIVE mg/dL   Hgb urine dipstick LARGE (A) NEGATIVE   Bilirubin Urine NEGATIVE NEGATIVE   Ketones, ur NEGATIVE NEGATIVE mg/dL   Protein, ur 30 (A) NEGATIVE mg/dL   Nitrite NEGATIVE NEGATIVE   Leukocytes, UA MODERATE (A) NEGATIVE  Urine microscopic-add on     Status: Abnormal   Collection Time: 12/05/15  7:23 PM  Result Value Ref Range   Squamous Epithelial / LPF 0-5 (A) NONE SEEN   WBC, UA 6-30 0 - 5 WBC/hpf   RBC / HPF 6-30 0 - 5 RBC/hpf   Bacteria, UA FEW (A) NONE SEEN  Pregnancy, urine POC     Status: None   Collection Time: 12/05/15  7:43 PM  Result Value Ref Range   Preg Test, Ur NEGATIVE NEGATIVE  Wet prep, genital     Status: Abnormal   Collection Time: 12/05/15  7:50 PM  Result Value Ref Range   Yeast Wet Prep HPF POC PRESENT (A) NONE SEEN   Trich, Wet Prep NONE SEEN NONE SEEN   Clue Cells Wet Prep HPF POC PRESENT (A) NONE SEEN   WBC, Wet Prep HPF POC MANY (A) NONE SEEN   Sperm NONE SEEN   CBC     Status: Abnormal   Collection Time: 12/05/15  8:05 PM  Result Value Ref Range   WBC 13.9 (H) 4.0 - 10.5 K/uL   RBC 4.27 3.87 - 5.11 MIL/uL   Hemoglobin 13.1 12.0 - 15.0 g/dL   HCT 16.1 09.6 - 04.5 %   MCV 92.5 78.0 - 100.0 fL   MCH 30.7 26.0 - 34.0 pg   MCHC 33.2 30.0 - 36.0 g/dL   RDW 40.9 81.1 - 91.4 %   Platelets 195 150 - 400 K/uL    GC/CT, HIV and urine culture sent  Toradol 60mg  IM given> much improved  MDM:  History of new sexual partner UTI symptom complex consistent with pyelo; doubt ureteral stones. Consulted with Dr. Erin Fulling Will give 1 dose Ceftriaxone !gm IM and then Cipro course  Assessment and Plan   1. Pyelonephritis      Medication List    STOP taking these medications        acetaminophen 325 MG tablet  Commonly known as:  TYLENOL     cetirizine 10 MG tablet  Commonly known as:  ZYRTEC ALLERGY  fluticasone 50 MCG/ACT nasal spray  Commonly known as:  FLONASE     naproxen 250 MG tablet  Commonly known as:  NAPROSYN     ondansetron 4 MG disintegrating tablet  Commonly known as:  ZOFRAN ODT      TAKE these medications        AZO TABS PO  Take 2 tablets by mouth 2 (two) times daily as needed (For urinary pain.).     ciprofloxacin 500 MG tablet  Commonly known as:  CIPRO  Take 1 tablet (500 mg total) by mouth 2 (two) times daily.     fluconazole 150 MG tablet  Commonly known as:  DIFLUCAN  Take 1 tablet (150 mg total) by mouth once.     ibuprofen 600 MG tablet  Commonly known as:  ADVIL,MOTRIN  Take 1 tablet (600 mg total) by mouth every 6 (six) hours as needed.     levonorgestrel-ethinyl estradiol 0.15-0.03 MG tablet  Commonly known as:  SEASONALE,INTROVALE,JOLESSA  Take 1 tablet by mouth daily.     metroNIDAZOLE 500 MG tablet  Commonly known as:  FLAGYL  Take 1 tablet (500 mg total) by mouth 2 (two) times daily.      Note to [facility] clinical pool to call in 48-72 hrs and be seen if not improved Follow-up Information    Follow up with [facility]. Schedule an appointment as soon as possible for a visit in 3 days.   Specialty:  Obstetrics and Gynecology   Why:  If symptoms worsen   Contact information:   [address] [place] [address] [phone]     Shelbey Spindler 12/05/2015, 7:35 PM

## 2015-12-05 NOTE — Discharge Instructions (Signed)
Pielonefritis en los adultos °(Pyelonephritis, Adult) °La pielonefritis es una infección del riñón. Los riñones son los órganos que filtran la sangre y eliminan los residuos del torrente sanguíneo a través de la orina. La orina pasa desde los riñones, a través de los uréteres, hacia la vejiga. Hay dos tipos principales de pielonefritis: °· Infecciones que se inician rápidamente sin síntomas previos (pielonefritis aguda). °· Infecciones que persisten durante un período prolongado (pielonefritis crónica). °En la mayoría de los casos, la infección desaparece con el tratamiento y no causa otros problemas. Las infecciones más graves o crónicas a veces pueden propagarse al torrente sanguíneo u ocasionar otros problemas en los riñones. °CAUSAS °Por lo general, entre las causas de esta afección, se incluyen las siguientes: °· Bacterias que pasan desde la vejiga al riñón a través de la orina infectada. La orina de la vejiga puede infectarse por bacterias relacionadas con estas causas: °¨ Infección en la vejiga (cistitis). °¨ Inflamación de la próstata (prostatitis). °¨ Relaciones sexuales en las mujeres. °· Bacterias que pasan del torrente sanguíneo al riñón. °FACTORES DE RIESGO °Es más probable que esta afección se manifieste en: °· Las embarazadas. °· Las personas de edad avanzada. °· Los diabéticos. °· Las personas que tienen cálculos en los riñones o la vejiga. °· Las personas que tienen otras anomalías en el riñón o los uréteres. °· Las personas que tienen una sonda vesical. °· Las personas con cáncer. °· Las personas que son sexualmente activas. °· Las mujeres que usan espermicidas. °· Las personas que han tenido una infección previa en las vías urinarias. °SÍNTOMAS °Los síntomas de esta afección incluyen lo siguiente: °· Ganas frecuentes de orinar. °· Necesidad intensa o persistente de orinar. °· Sensación de ardor o escozor al orinar. °· Dolor abdominal. °· Dolor de espalda. °· Dolor al costado del cuerpo o en la  fosa lumbar. °· Fiebre. °· Escalofríos. °· Sangre en la orina u orina oscura. °· Náuseas. °· Vómitos. °DIAGNÓSTICO °Esta afección se puede diagnosticar en función de lo siguiente: °· Examen físico e historia clínica. °· Análisis de orina. °· Análisis de sangre. °También pueden hacerle estudios de diagnóstico por imágenes de los riñones, por ejemplo, una ecografía o una tomografía computarizada. °TRATAMIENTO °El tratamiento de esta afección puede depender de la gravedad de la infección. °· Si la infección es leve y se detecta rápidamente, pueden administrarle antibióticos por vía oral. Deberá tomar líquido para permanecer hidratado. °· Si la infección es más grave, es posible que deban hospitalizarlo para administrarle antibióticos directamente en una vena a través de una vía intravenosa (IV). Quizás también deban administrarle líquidos a través de una vía intravenosa si no se encuentra bien hidratado. Después de la hospitalización, es posible que deba tomar antibióticos durante un tiempo. °Podrán prescribirle otros tratamientos según la causa de la infección. °INSTRUCCIONES PARA EL CUIDADO EN EL HOGAR °Medicamentos °· Tome los medicamentos de venta libre y los recetados solamente como se lo haya indicado el médico. °· Si le recetaron un antibiótico, tómelo como se lo haya indicado el médico. No deje de tomar los antibióticos aunque comience a sentirse mejor. °Instrucciones generales °· Beba suficiente líquido para mantener la orina clara o de color amarillo pálido. °· Evite la cafeína, el té y las bebidas gaseosas. Estas sustancias irritan la vejiga. °· Orine con frecuencia. Evite retener la orina durante largos períodos. °· Orine antes y después de las relaciones sexuales. °· Después de defecar, las mujeres deben higienizarse la región perineal desde adelante hacia atrás. Use cada trozo de   papel higiénico solo una vez. °· Concurra a todas las visitas de control como se lo haya indicado el médico. Esto es  importante. °SOLICITE ATENCIÓN MÉDICA SI: °· Los síntomas no mejoran después de 2 días de tratamiento. °· Los síntomas empeoran. °· Tiene fiebre. °SOLICITE ATENCIÓN MÉDICA DE INMEDIATO SI: °· No puede tomar los antibióticos ni ingerir líquidos. °· Comienza a sentir escalofríos. °· Vomita. °· Siente un dolor intenso en la espalda o en la fosa lumbar. °· Se desmaya o siente una debilidad extrema. °  °Esta información no tiene como fin reemplazar el consejo del médico. Asegúrese de hacerle al médico cualquier pregunta que tenga. °  °Document Released: 02/17/2005 Document Revised: 01/29/2015 °Elsevier Interactive Patient Education ©2016 [employer] ° °

## 2015-12-05 NOTE — MAU Note (Signed)
My kidney is hurting from L flank coming around to front. No vag bleeding or d/c. Dysuria. Pain since yesterday

## 2015-12-05 NOTE — Progress Notes (Signed)
Pt has friend with her who will interpret. Waiver signed. Friend's name is Thomasena EdisSandra Urbieta

## 2015-12-06 LAB — HIV ANTIBODY (ROUTINE TESTING W REFLEX): HIV Screen 4th Generation wRfx: NONREACTIVE

## 2015-12-08 LAB — URINE CULTURE: Culture: >=100000 — AB

## 2015-12-08 LAB — GC/CHLAMYDIA PROBE AMP (~~LOC~~) NOT AT ARMC
CHLAMYDIA, DNA PROBE: NEGATIVE
Neisseria Gonorrhea: NEGATIVE

## 2015-12-10 ENCOUNTER — Telehealth: Payer: Self-pay | Admitting: *Deleted

## 2015-12-10 NOTE — Telephone Encounter (Signed)
Urine culture showed sensitivity to cipro. Called patient today, used Spanish interpreter [phone]. Patient reported she is feeling much better since being on antibiotics. No problems or questions.

## 2015-12-10 NOTE — Telephone Encounter (Signed)
-----   Message from Danae Orleanseirdre C Poe, CNM sent at 12/05/2015  9:21 PM EDT ----- Non-pregnant with pyelo. Speaks Spanish. Could someone check her urine culture result to see if sensitive to Cipro which we prescribed? Please call Mon or Tue to make sure she's better. If not improved she needs to be seen to rule out complicated pyelo or stone.  Thanks (I will be out of town or would do this)

## 2015-12-17 ENCOUNTER — Telehealth: Payer: Self-pay | Admitting: *Deleted

## 2015-12-17 NOTE — Telephone Encounter (Signed)
Per message from [employer]- check urine culture in this non- pregant patient to  make sure is sensitive to Cipro- call patient and make sure she is feeling better.  Per chart review was sensitve to cipro. Called patient with Paula Gamble Interpreter#[id number] and patient states she has not finished taking cipro, but is feeling better and denied any symptoms.  I instructed her to finish cipro to make sure uti goes completely away. I also advised her to call us or go to MAU if symtoms return. She voices understanding.

## 2016-02-26 ENCOUNTER — Encounter (HOSPITAL_COMMUNITY): Payer: Self-pay | Admitting: *Deleted

## 2016-02-26 ENCOUNTER — Inpatient Hospital Stay (HOSPITAL_COMMUNITY)
Admission: AD | Admit: 2016-02-26 | Discharge: 2016-02-26 | Disposition: A | Discharge status: Home / Self Care | Payer: Self-pay | Source: Ambulatory Visit | Attending: Family Medicine | Admitting: Family Medicine

## 2016-02-26 DIAGNOSIS — R109 Unspecified abdominal pain: Secondary | ICD-10-CM | POA: Insufficient documentation

## 2016-02-26 DIAGNOSIS — Z3202 Encounter for pregnancy test, result negative: Secondary | ICD-10-CM | POA: Insufficient documentation

## 2016-02-26 LAB — URINE MICROSCOPIC-ADD ON: Bacteria, UA: NONE SEEN

## 2016-02-26 LAB — CBC
HEMATOCRIT: 41.4 % (ref 36.0–46.0)
HEMOGLOBIN: 14.1 g/dL (ref 12.0–15.0)
MCH: 31.4 pg (ref 26.0–34.0)
MCHC: 34.1 g/dL (ref 30.0–36.0)
MCV: 92.2 fL (ref 78.0–100.0)
Platelets: 220 10*3/uL (ref 150–400)
RBC: 4.49 MIL/uL (ref 3.87–5.11)
RDW: 12.5 % (ref 11.5–15.5)
WBC: 9.1 10*3/uL (ref 4.0–10.5)

## 2016-02-26 LAB — URINALYSIS, ROUTINE W REFLEX MICROSCOPIC
Bilirubin Urine: NEGATIVE
Glucose, UA: NEGATIVE mg/dL
Hgb urine dipstick: NEGATIVE
Ketones, ur: NEGATIVE mg/dL
NITRITE: NEGATIVE
PH: 8 (ref 5.0–8.0)
Protein, ur: NEGATIVE mg/dL
SPECIFIC GRAVITY, URINE: 1.01 (ref 1.005–1.030)

## 2016-02-26 LAB — POCT PREGNANCY, URINE: PREG TEST UR: NEGATIVE

## 2016-02-26 LAB — HCG, QUANTITATIVE, PREGNANCY

## 2016-02-26 NOTE — Discharge Instructions (Signed)
Cuidados prenatales (Prenatal Care) QU SON LOS CUIDADOS PRENATALES?  Los cuidados prenatales son Sandi Mariscal se brindan a una embarazada antes del [place]. Los cuidados prenatales garantizan que la embarazada y el feto estn tan sanos como sea posible durante todo el [place]. Pueden brindar Goodrich Corporation tipo de cuidados [place], un mdico de atencin primaria o un especialista en parto y Psychiatrist ([place]). Los cuidados prenatales incluyen exmenes fsicos, estudios, tratamientos e informacin sobre nutricin, estilo de vida y servicios de apoyo social. POR QU SON TAN IMPORTANTES LOS CUIDADOS PRENATALES?  Los cuidados prenatales recibidos desde un inicio y de forma peridica aumentan la probabilidad de que usted y el beb permanezcan sanos durante todo el [place]. Este tipo de cuidados tambin reduce el riesgo de que el beb nazca mucho antes de la fecha probable de parto (prematuro) o de que sea ms pequeo de lo previsto (pequeo para la edad gestacional).Durante las visitas prenatales, se Engineer, technical sales clase de enfermedad preexistente que usted pueda tener y que represente un riesgo durante el Psychiatrist. Tambin la monitorearn con regularidad para Insurance risk surveyor afeccin que pueda surgir Academic librarian, a fin de tratarla con rapidez y eficacia. QU SUCEDE DURANTE LAS VISITAS PRENATALES? Las visitas prenatales pueden incluir lo siguiente: Dilogo Informe al mdico cualquier signo o sntoma nuevo que haya tenido desde la ltima visita. Estos pueden incluir los siguientes:  Nuseas o vmitos.  Aumento o disminucin del nivel de [place].  Dificultad para dormir.  Dolor en la espalda o las piernas.  Cambios en [employer].  Ganas frecuentes de Geographical information systems officer.  Falta de aire al realizar actividad fsica.  Cambios en la piel, por ejemplo, una erupcin cutnea o picazn.  Sangrado o flujo vaginal.  Sensacin de excitacin o nerviosismo.  Cambios en los movimientos del feto. Es  conveniente que escriba cualquier pregunta o tema del que quiera hablar con el mdico, para llevarlo anotado a la cita. Exmenes Durante la primera visita prenatal, es probable que le hagan un examen fsico completo. El [employer] revisar con frecuencia la vagina, el cuello del tero y la posicin del tero, adems de examinarle el corazn, los pulmones y otras partes del cuerpo. A medida que el embarazo avance, el mdico medir el tamao del tero y IT consultant posicin del feto dentro del tero. Tambin puede examinarla para [employer] primeros signos del [place] de [place]. Las visitas prenatales tambin pueden incluir el control de la presin arterial y, despus de 10 a 12semanas de embarazo, aproximadamente, el control de los latidos del feto. Estudios Los estudios habituales suelen incluir lo siguiente:  Anlisis de [place]. Este anlisis examina la presencia de glucosa, protenas o signos de infeccin en la orina.  Recuento sanguneo. Este anlisis verifica el nivel de glbulos rojos y blancos en el organismo.  Pruebas de enfermedades de transmisin sexual (ETS). Las pruebas de Airline pilot de ETS al comienzo del embarazo son Neomia Dear prctica de rutina, y en muchos estados es obligacin practicarlas.  Anlisis de anticuerpos. La examinarn para ver si es inmune a determinadas enfermedades, como la [place], que puede afectar al feto en desarrollo.  Deteccin de glucosa. Entre la semana 24y 28de embarazo, le analizarn el nivel de glucemia para detectar signos de diabetes gestacional. Pueden recomendarle un anlisis de seguimiento.  Estreptococos del grupoB. Es comn encontrar estas bacterias dentro de la vagina. Este Abbott Laboratories indicar al mdico si necesita darle un antibitico para reducir la cantidad de este tipo de bacterias en el cuerpo antes del trabajo de  parto y [place].  Ecografas. Alrededor de la semana 18a 20de embarazo, muchas embarazadas se hacen ecografas para evaluar la  salud del feto y Engineer, manufacturing cualquier anomala en el desarrollo.  Prueba del VIH (virus de inmunodeficiencia humana). Al comienzo del Psychiatrist, le harn una prueba de deteccin del VIH. Si corre un riesgo alto de [place] VIH, pueden repetirle esta prueba durante el tercer trimestre del embarazo. Pueden indicarle otro tipo de estudios segn su edad, sus antecedentes mdicos personales o familiares, u otros factores.  CON QU FRECUENCIA DEBO VISITAR AL MDICO PARA LOS CUIDADOS PRENATALES? El programa de control correspondiente a los cuidados prenatales depender de cualquier enfermedad que usted tenga desde antes del embarazo o que haya desarrollado durante el mismo. Si usted no tiene Agricultural engineer, es probable que le hagan los siguientes controles:  Physiological scientist vez al mes durante los primeros de [place].  Dos veces al mes durante el sptimo y el octavo mes de [place].  Una vez a la Boston Scientific noveno mes de Psychiatrist y [place]. Si presenta signos de trabajo de parto prematuro u otros signos o sntomas preocupantes, es posible que deba ver al mdico con ms frecuencia. Consulte al H[employer] Inc programa de cuidados prenatales ms adecuado para su caso. QU PUEDO HACER PARA QUE EL BEB Y YO ESTEMOS TAN SANOS COMO SEA POSIBLE DURANTE EL EMBARAZO?  Tome una vitamina prenatal que contenga (0,4mg ) de cido flico CarMax. El mdico tambin puede indicarle que tome vitaminas adicionales, como yodo, vitaminaD, hierro, cobre y zinc.  [place] de 1500 a 2000mg  de Fiserv desde la semana20 de Counsellor.  Asegrese de estar al da con las vacunas. A menos que el mdico le indique otra cosa:  Debe aplicarse la vacuna contra la difteria, el ttanos y la tosferina (Tdap) entre la semana27 y 36de embarazo, independientemente de la fecha en la que recibi la ltima vacuna Tdap. Esta vacuna ayuda a proteger al beb contra la tosferina  despus del nacimiento.  Debe recibir Neomia Dear vacuna antigripal inactivada (IIV) anual como ayuda para protegerlos a usted y al beb de la gripe. Puede recibirla en cualquier momento del embarazo.  Siga una dieta bien equilibrada, que incluya lo siguiente:  Nils Pyle y verduras frescas.  Protenas magras.  Alimentos con FedEx de calcio, [place], [place], quesos duros y verduras de hojas color verde oscuro.  Panes integrales.  No coma frutos de mar con alto contenido de mercurio, por ejemplo:  Pez espada.  [place].  [place].  [place].  Ms de Sabino Snipes de atn por semana.  No coma lo siguiente:  Carnes o huevos crudos o mal cocidos.  Alimentos no pasteurizados, como quesos blandos (brie, [place] o feta), jugos y [place].  Embutidos.  Salchichas que no se cocinaron en agua hirviendo.  Beba suficiente agua para mantener la orina clara o de color amarillo plido. Para muchas mujeres, la cantidad es de 10 o ms vasos de 8onzas de Regulatory affairs officer. El hecho de mantenerse hidratada ayuda a que el feto reciba nutrientes y Network engineer el inicio de contracciones uterinas prematuras.  No consuma ningn producto que contenga tabaco, como cigarrillos, tabaco de Theatre manager o Administrator, Civil Service. Si necesita ayuda para dejar de fumar, consulte al mdico.  No consuma bebidas que contengan alcohol. No se ha determinado que haya un nivel de consumo de alcohol que sea inocuo durante el [place].  No consuma drogas. Estas pueden daar al  feto en desarrollo o causar un aborto espontneo.  Consulte al mdico o al farmacutico antes de tomar cualquier medicamento recetado o de venta libre, hierbas o suplementos.  Limite el consumo de cafena a no ms de 200mg  por da.  Haga actividad fsica. A menos que el mdico le indique otra cosa, intente hacer de ejercicio moderado la mayora de los [address] de la [place]. No practique actividades de alto impacto, deportes de contacto o actividades  con alto riesgo de cadas, como equitacin o esqu extremo.  Descanse lo suficiente.  Evite todo aquello que aumente la temperatura corporal, como jacuzzis y saunas.  Si tiene un gato, no vace la bandeja sanitaria. Las bacterias presentes en las heces del gato pueden causar una infeccin llamada toxoplasmosis. Esta puede daar gravemente al feto.  Aljese de las sustancias qumicas como insecticidas, plomo y [place], y de los productos de limpieza o pinturas que contengan solventes.  No se saque ninguna radiografa, excepto si es necesaria por razones mdicas.  Tome una clase de preparacin para el parto y Mining engineer. Pregntele al mdico si necesita una derivacin o una recomendacin.   Esta informacin no tiene Theme park manager el consejo del mdico. Asegrese de hacerle al mdico cualquier pregunta que tenga.   Document Released: 10/27/2007 Document Revised: 05/31/2014 Elsevier Interactive Patient Education 2016 [employer].  Informacin sobre la prueba de Psychiatrist (Pregnancy Test Information) EN QU CONSISTE UNA PRUEBA DE EMBARAZO? La prueba de embarazo se [place] para Landscape architect presencia de gonadotropina corinica humana [facility]) en Lauris Poag de [place] o de [place]. La [facility] es una hormona que producen las clulas de la placenta. La placenta es el rgano que se forma como apoyo vital del beb en desarrollo y para satisfacer sus necesidades de nutricin. Para esta prueba, se requiere [place] de [place] u [place]. La prueba de embarazo determina si est embarazada o no. CMO SE REALIZAN LAS PRUEBAS DE EMBARAZO? Las pruebas de Psychiatrist se Radiographer, therapeutic mediante una prueba de Psychiatrist casera o un anlisis de sangre u orina que se realiza en el consultorio del mdico.  Las pruebas de Psychiatrist caseras requieren [place] Poag de [place].  La mayora de los kits utilizan un dispositivo de prueba de plstico con una tira reactiva de papel, que indica si hay presencia de [facility] en la  orina.  Siga muy cuidadosamente las instrucciones de la prueba.  Despus de News Corporation varilla de la prueba, aparecern marcas que le informarn si est embarazada o no.  Para obtener Safeco Corporation, use la primera orina de la [place], que es cuando la concentracin de [facility] es ms elevada. El anlisis de sangre para Engineer, manufacturing un Psychiatrist requiere la extraccin de [place] Poag de sangre de una vena de la mano o el brazo. El mdico enviar su Lynder Parents a un laboratorio para su anlisis. Los Norfolk Southern de la prueba de Psychiatrist sern positivos o Audiological scientist. UN TIPO DE PRUEBA DE [facility] ES MEJOR QUE OTRO? En algunos casos, puede suceder que la prueba de [place] arroje un resultado negativo y el anlisis de sangre uno positivo. Esto sucede porque los anlisis de sangre son ms precisos. Esto significa que los anlisis de sangre pueden Landscape architect [facility] antes que las pruebas de embarazo caseras.  QU PRECISIN TIENEN LAS PRUEBAS DE EMBARAZO CASERAS?  Ambos tipos de pruebas de [place] Star son muy precisos.  Un anlisis de sangre tiene una precisin del 98%.  Cuando el embarazo est muy avanzado y si se usan correctamente, las  pruebas de embarazo caseras son igualmente precisas. EXISTEN FACTORES QUE PUEDEN INTERFERIR CON LOS RESULTADOS DE LA PRUEBA DE EMBARAZO?  Es posible que algunas condiciones causen un resultado impreciso (positivo falso o negativofalso).  Un resultado positivo falso es un resultado positivo cuando no est embarazada. Esto puede suceder si usted:  Est tomando determinados medicamentos, incluidos los anticonvulsivos o los tranquilizantes.  Tiene ciertas protenas en la sangre.  Un resultado negativo falso es un resultado negativo cuando efectivamente s est embarazada. Esto puede suceder si usted:  Se realiz la prueba antes de que hubiera suficiente [facility] para Engineer, manufacturingdetectar. En la Lennar Corporationmayora de las mujeres, el resultado de la prueba de Psychiatristembarazo no ser positivo hasta 3 o 4 semanas  despus de la concepcin.  Bebi abundante cantidad de lquido antes de la prueba. Las muestras de [place]ina diluida a veces pueden arrojar un resultado impreciso.  Toma algunos medicamentos, como diurticos o algunos antihistamnicos. QU DEBO HACER SI EL RESULTADO DE MI PRUEBA DE EMBARAZO DA POSITIVO? Si el resultado de su prueba de Psychiatristembarazo es positivo, programe una cita con su mdico. Podra necesitar pruebas adicionales para Production assistant, radioconfirmar el embarazo. Mientras tanto, comience a tomar vitaminas prenatales, deje de fumar y de tomar alcohol, y no consuma drogas. Hable con su mdico acerca de cmo cuidarse durante el embarazo. Pregntele sobre la atencin que necesitar durante todo el Psychiatristembarazo (atencin prenatal).   Esta informacin no tiene Theme park managercomo fin reemplazar el consejo del mdico. Asegrese de hacerle al mdico cualquier pregunta que tenga.   Document Released: 04/26/2012 Document Revised: 05/31/2014 Elsevier Interactive Patient Education Yahoo! Inc2016 Elsevier Inc.

## 2016-02-26 NOTE — MAU Provider Note (Signed)
Patient was brought back to the room and Spanish interpretor was present, patient does not understand any [place]ia[place]. Patient was informed of a negative pregnancy test. She desires pregnancy, and is off birth control. She refused to be seen for the abdominal pain due to not being bad enough for examination, she mainly came for pregnancy test. She was encouraged to get OTC prenatal vitamins from any pharmacy and start taking today.  Questions were answered to her satisfaction, her partner was in the room with her and had no further questions either.  Cleda ClarksElizabeth W. Harvy Riera, DO  OB Fellow [facility], [facility]

## 2016-02-26 NOTE — MAU Note (Signed)
Pt reports having abd pain for about a month ago. Had positive  HPT about 1.5 months ago.  Started spotting 3 weeks ago. C/O headaches s well. No abd pain now

## 2016-03-23 ENCOUNTER — Inpatient Hospital Stay (HOSPITAL_COMMUNITY)
Admission: AD | Admit: 2016-03-23 | Discharge: 2016-03-23 | Disposition: A | Discharge status: Home / Self Care | Payer: Medicaid Other | Source: Ambulatory Visit | Attending: Family Medicine | Admitting: Family Medicine

## 2016-03-23 ENCOUNTER — Encounter (HOSPITAL_COMMUNITY): Payer: Self-pay | Admitting: *Deleted

## 2016-03-23 ENCOUNTER — Ambulatory Visit (INDEPENDENT_AMBULATORY_CARE_PROVIDER_SITE_OTHER): Payer: Self-pay

## 2016-03-23 ENCOUNTER — Inpatient Hospital Stay (HOSPITAL_COMMUNITY): Payer: Self-pay

## 2016-03-23 ENCOUNTER — Encounter: Payer: Self-pay | Admitting: Family Medicine

## 2016-03-23 DIAGNOSIS — O99331 Smoking (tobacco) complicating pregnancy, first trimester: Secondary | ICD-10-CM | POA: Insufficient documentation

## 2016-03-23 DIAGNOSIS — Z3201 Encounter for pregnancy test, result positive: Secondary | ICD-10-CM

## 2016-03-23 DIAGNOSIS — R109 Unspecified abdominal pain: Secondary | ICD-10-CM | POA: Insufficient documentation

## 2016-03-23 DIAGNOSIS — O26891 Other specified pregnancy related conditions, first trimester: Secondary | ICD-10-CM | POA: Insufficient documentation

## 2016-03-23 DIAGNOSIS — Z3200 Encounter for pregnancy test, result unknown: Secondary | ICD-10-CM

## 2016-03-23 DIAGNOSIS — O26899 Other specified pregnancy related conditions, unspecified trimester: Secondary | ICD-10-CM

## 2016-03-23 DIAGNOSIS — O3680X0 Pregnancy with inconclusive fetal viability, not applicable or unspecified: Secondary | ICD-10-CM

## 2016-03-23 DIAGNOSIS — Z3A01 Less than 8 weeks gestation of pregnancy: Secondary | ICD-10-CM | POA: Insufficient documentation

## 2016-03-23 HISTORY — DX: Tubulo-interstitial nephritis, not specified as acute or chronic: N12

## 2016-03-23 LAB — CBC
HCT: 39.1 % (ref 36.0–46.0)
Hemoglobin: 13.4 g/dL (ref 12.0–15.0)
MCH: 31.4 pg (ref 26.0–34.0)
MCHC: 34.3 g/dL (ref 30.0–36.0)
MCV: 91.6 fL (ref 78.0–100.0)
PLATELETS: 200 10*3/uL (ref 150–400)
RBC: 4.27 MIL/uL (ref 3.87–5.11)
RDW: 12.7 % (ref 11.5–15.5)
WBC: 10.6 10*3/uL — AB (ref 4.0–10.5)

## 2016-03-23 LAB — URINALYSIS, ROUTINE W REFLEX MICROSCOPIC
BILIRUBIN URINE: NEGATIVE
Glucose, UA: NEGATIVE mg/dL
HGB URINE DIPSTICK: NEGATIVE
Ketones, ur: NEGATIVE mg/dL
Leukocytes, UA: NEGATIVE
Nitrite: NEGATIVE
PROTEIN: NEGATIVE mg/dL
Specific Gravity, Urine: 1.01 (ref 1.005–1.030)
pH: 6.5 (ref 5.0–8.0)

## 2016-03-23 LAB — ABO/RH: ABO/RH(D): O POS

## 2016-03-23 LAB — WET PREP, GENITAL
Clue Cells Wet Prep HPF POC: NONE SEEN
Trich, Wet Prep: NONE SEEN
WBC, Wet Prep HPF POC: NONE SEEN
YEAST WET PREP: NONE SEEN

## 2016-03-23 LAB — POCT PREGNANCY, URINE: PREG TEST UR: POSITIVE — AB

## 2016-03-23 LAB — HCG, QUANTITATIVE, PREGNANCY: HCG, BETA CHAIN, QUANT, S: 124 m[IU]/mL — AB (ref <5)

## 2016-03-23 NOTE — MAU Provider Note (Signed)
History     CSN: [id number]  Arrival date and time: 03/23/16 1109    First Provider Initiated Contact with Patient 03/23/16 1159       Chief Complaint  Patient presents with  . Abdominal Pain   HPI Paula Gamble is a 20 y.o. G3P0020 at [id number] by LMP who presents with abdominal pain. Pt reports onset of abdominal pain this morning [facility]. Lower abdominal pain that she rates 8/10. Has not treated. Nothing makes better or worse. Denies n/v/d, constipation, dysuria, vaginal bleeding, or vaginal discharge.   Spanish interpreter at bedside.  OB History    Gravida Para Term Preterm AB Living   3       2     SAB TAB Ectopic Multiple Live Births   2              Past Medical History:  Diagnosis Date  . Pyelonephritis     Past Surgical History:  Procedure Laterality Date  . DILATION AND CURETTAGE OF UTERUS     x2    Family History  Problem Relation Age of Onset  . Diabetes Paternal Uncle   . Cancer Maternal Grandmother   . Cancer Paternal Grandmother     liver    Social History  Substance Use Topics  . Smoking status: Current Some Day Smoker  . Smokeless tobacco: Never Used  . Alcohol use Yes     Comment: several days during the week    Allergies:  Allergies  Allergen Reactions  . Latex Itching    Prescriptions Prior to Admission  Medication Sig Dispense Refill Last Dose  . Diphenhydramine-PE-APAP (THERAFLU WARMING RELIEF FLU) 12.5-5-325 MG/15ML LIQD Take by mouth.   03/22/2016 at Unknown time  . fluconazole (DIFLUCAN) 150 MG tablet Take 1 tablet (150 mg total) by mouth once. (Patient not taking: Reported on 03/23/2016) 1 tablet 0 Not Taking at Unknown time  . metroNIDAZOLE (FLAGYL) 500 MG tablet Take 1 tablet (500 mg total) by mouth 2 (two) times daily. (Patient not taking: Reported on 03/23/2016) 14 tablet 0 Not Taking at Unknown time    Review of Systems  Constitutional: Negative.    Gastrointestinal: Positive for abdominal pain. Negative for constipation, diarrhea, nausea and vomiting.  Genitourinary: Negative.    Physical Exam   Blood pressure 109/58, pulse 77, temperature 98.3 F (36.8 C), temperature source Oral, resp. rate 16, height 5' 2.5" (1.588 m), weight 163 lb 8 oz (74.2 kg), last menstrual period 02/11/2016.  Physical Exam  Nursing note and vitals reviewed. Constitutional: She is oriented to person, place, and time. She appears well-developed and well-nourished. No distress.  HENT:  Head: Normocephalic and atraumatic.  Eyes: Conjunctivae are normal. Right eye exhibits no discharge. Left eye exhibits no discharge. No scleral icterus.  Neck: Normal range of motion.  Cardiovascular: Normal rate, regular rhythm and normal heart sounds.   No murmur heard. Respiratory: Effort normal and breath sounds normal. No respiratory distress. She has no wheezes.  GI: Soft. Bowel sounds are normal. She exhibits no distension. There is no tenderness. There is no rebound.  Genitourinary: Uterus normal. Cervix exhibits no motion tenderness. Right adnexum displays no mass and no tenderness. Left adnexum displays no mass and no tenderness.  Genitourinary Comments: Cervix closed  Neurological: She is alert and oriented to person, place, and time.  Skin: Skin is warm and dry. She is not diaphoretic.  Psychiatric: She has a normal mood and affect.  Her behavior is normal. Judgment and thought content normal.    MAU Course  Procedures Results for orders placed or performed during the hospital encounter of 03/23/16 (from the past 24 hour(s))  Urinalysis, Routine w reflex microscopic (not [facility])     Status: None   Collection Time: 03/23/16 11:35 AM  Result Value Ref Range   Color, Urine YELLOW YELLOW   APPearance CLEAR CLEAR   Specific Gravity, Urine 1.010 1.005 - 1.030   pH 6.5 5.0 - 8.0   Glucose, UA NEGATIVE NEGATIVE mg/dL   Hgb urine dipstick NEGATIVE NEGATIVE    Bilirubin Urine NEGATIVE NEGATIVE   Ketones, ur NEGATIVE NEGATIVE mg/dL   Protein, ur NEGATIVE NEGATIVE mg/dL   Nitrite NEGATIVE NEGATIVE   Leukocytes, UA NEGATIVE NEGATIVE  Wet prep, genital     Status: None   Collection Time: 03/23/16 12:10 PM  Result Value Ref Range   Yeast Wet Prep HPF POC NONE SEEN NONE SEEN   Trich, Wet Prep NONE SEEN NONE SEEN   Clue Cells Wet Prep HPF POC NONE SEEN NONE SEEN   WBC, Wet Prep HPF POC NONE SEEN NONE SEEN   Sperm FEW   CBC     Status: Abnormal   Collection Time: 03/23/16 12:16 PM  Result Value Ref Range   WBC 10.6 (H) 4.0 - 10.5 K/uL   RBC 4.27 3.87 - 5.11 MIL/uL   Hemoglobin 13.4 12.0 - 15.0 g/dL   HCT 45.439.1 09.836.0 - 11.946.0 %   MCV 91.6 78.0 - 100.0 fL   MCH 31.4 26.0 - 34.0 pg   MCHC 34.3 30.0 - 36.0 g/dL   RDW 14.712.7 82.911.5 - 56.215.5 %   Platelets 200 150 - 400 K/uL  ABO/Rh     Status: None (Preliminary result)   Collection Time: 03/23/16 12:16 PM  Result Value Ref Range   ABO/RH(D) O POS   hCG, quantitative, pregnancy     Status: Abnormal   Collection Time: 03/23/16 12:16 PM  Result Value Ref Range   hCG, Beta Chain, Quant, S 124 (H) <5 mIU/mL   [place] Ob Comp Less 14 Wks  Result Date: 03/23/2016 CLINICAL DATA:  Right lower quadrant abdominal pain. EXAM: OBSTETRIC <14 WK US AND TRANSVAGINAL OB US TECHNIQUE: Both transabdominal and transvaginal ultrasound examinations were performed for complete evaluation of the gestation as well as the maternal uterus, adnexal regions, and pelvic cul-de-sac. Transvaginal technique was performed to assess early pregnancy. COMPARISON:  None. FINDINGS: Intrauterine gestational sac: None visualized Yolk sac:  Not visualized Embryo:  Not visualized Cardiac Activity: Heart Rate:   bpm MSD:   mm    w     d CRL:    mm    w    d                  US EDC: Subchorionic hemorrhage:  None visualized. Maternal uterus/adnexae: No adnexal masses. Small amount of free fluid in the pelvis. IMPRESSION: No intrauterine pregnancy visualized.  Differential considerations would include early intrauterine pregnancy too early to visualize, spontaneous abortion, or occult ectopic pregnancy. Recommend close clinical followup and serial quantitative beta HCGs and ultrasounds. Electronically Signed   By: Charlett NoseKevin  Dover M.D.   On: 03/23/2016 14:41   [place] Ob Transvaginal  Result Date: 03/23/2016 CLINICAL DATA:  Right lower quadrant abdominal pain. EXAM: OBSTETRIC <14 WK US AND TRANSVAGINAL OB US TECHNIQUE: Both transabdominal and transvaginal ultrasound examinations were performed for complete evaluation of the gestation as well as  the maternal uterus, adnexal regions, and pelvic cul-de-sac. Transvaginal technique was performed to assess early pregnancy. COMPARISON:  None. FINDINGS: Intrauterine gestational sac: None visualized Yolk sac:  Not visualized Embryo:  Not visualized Cardiac Activity: Heart Rate:   bpm MSD:   mm    w     d CRL:    mm    w    d                  [place] EDC: Subchorionic hemorrhage:  None visualized. Maternal uterus/adnexae: No adnexal masses. Small amount of free fluid in the pelvis. IMPRESSION: No intrauterine pregnancy visualized. Differential considerations would include early intrauterine pregnancy too early to visualize, spontaneous abortion, or occult ectopic pregnancy. Recommend close clinical followup and serial quantitative beta HCGs and ultrasounds. Electronically Signed   By: Charlett Nose M.D.   On: 03/23/2016 14:41    MDM +UPT UA, wet prep, GC/chlamydia, CBC, ABO/Rh, quant hCG, HIV, and [place] today to rule out ectopic pregnancy BHCG 124 Ultrasound shows no IUP or adnexal mass This abdominal pain could represent a normal pregnancy, spontaneous abortion, or even an ectopic pregnancy which can be life-threatening. Cultures were obtained to rule out pelvic infection.   Assessment and Plan  A: 1. Pregnancy of unknown anatomic location   2. Abdominal pain during pregnancy    P: Discharge home [facility] Thursday  morning for BHCG Discussed reasons to return to MAU   Judeth Horn 03/23/2016, 11:58 AM

## 2016-03-23 NOTE — Progress Notes (Signed)
Patient presented to office today for a pregnancy test. Test confirms she is pregnant around 5 weeks EDD 11/17/2016. She has been given a letter of verification. Medication have been reviewed. Patient was advised to call her ob provider before taking any medications.Patient will follow up at the health department for prenatal care.

## 2016-03-23 NOTE — Progress Notes (Signed)
Swabs obtained by blind swab -no spec

## 2016-03-23 NOTE — MAU Note (Signed)
Urine sent to lab 

## 2016-03-23 NOTE — MAU Note (Signed)
Pt now states is having pain in lower abd, started when she was walking.  (told interpretor when registering that she wants an US). Some vomiting last night, is nauseated now.  [facility]

## 2016-03-23 NOTE — Progress Notes (Signed)
Judeth HornErin Lawrence NP in earlier with Eda, Spanish interpreter, to discuss test results and d/c plan. Written and verbal d/c instructions given and understanding voiced.

## 2016-03-23 NOTE — Discharge Instructions (Signed)
Dolor abdominal en el embarazo  (Abdominal Pain During Pregnancy)  El dolor abdominal es frecuente durante el embarazo. Generalmente no causa ningún daño. El dolor abdominal puede tener numerosas causas. Algunas causas son más graves que otras. Ciertas causas de dolor abdominal durante el embarazo se diagnostican fácilmente. A veces, se tarda un tiempo para llegar al diagnóstico. Otras veces la causa no se conoce. El dolor abdominal puede estar relacionado con alguna alteración del embarazo, o puede deberse a una causa totalmente diferente. Por este motivo, siempre consulte a su médico cuando sienta molestias abdominales.  INSTRUCCIONES PARA EL CUIDADO EN EL HOGAR   Esté atenta al dolor para ver si hay cambios. Las siguientes indicaciones ayudarán a aliviar cualquier molestia que pueda sentir:  · No tenga relaciones sexuales y no coloque nada dentro de la vagina hasta que los síntomas hayan desaparecido completamente.  · Descanse todo lo que pueda hasta que el dolor se le haya calmado.  · Si siente náuseas, beba líquidos claros. Evite los alimentos sólidos mientras sienta malestar o tenga náuseas.  · Tome sólo medicamentos de venta libre o recetados, según las indicaciones del médico.  · Cumpla con todas las visitas de control, según le indique su médico.  SOLICITE ATENCIÓN MÉDICA DE INMEDIATO SI:  · Tiene un sangrado, pérdida de líquidos o elimina tejidos por la vagina.  · El dolor o los cólicos aumentan.  · Tiene vómitos persistentes.  · Comienza a sentir dolor al orinar u observa sangre.  · Tiene fiebre.  · Nota que los movimientos del bebé disminuyen.  · Siente intensa debilidad o se marea.  · Tiene dificultad para respirar con o sin dolor abdominal.  · Siente un dolor de cabeza intenso junto al dolor abdominal.  · Tiene una secreción vaginal anormal con dolor abdominal.  · Tiene diarrea persistente.  · El dolor abdominal sigue o empeora aún después de hacer reposo.  ASEGÚRESE DE QUE:   · Comprende estas  instrucciones.  · Controlará su afección.  · Recibirá ayuda de inmediato si no mejora o si empeora.     Esta información no tiene como fin reemplazar el consejo del médico. Asegúrese de hacerle al médico cualquier pregunta que tenga.     Document Released: 05/10/2005 Document Revised: 02/28/2013  Elsevier Interactive Patient Education ©2016 [employer]

## 2016-03-24 LAB — GC/CHLAMYDIA PROBE AMP (~~LOC~~) NOT AT ARMC
Chlamydia: NEGATIVE
Neisseria Gonorrhea: NEGATIVE

## 2016-03-24 LAB — HIV ANTIBODY (ROUTINE TESTING W REFLEX): HIV SCREEN 4TH GENERATION: NONREACTIVE

## 2016-03-25 ENCOUNTER — Ambulatory Visit: Payer: Self-pay

## 2016-03-25 DIAGNOSIS — O3680X0 Pregnancy with inconclusive fetal viability, not applicable or unspecified: Secondary | ICD-10-CM

## 2016-03-25 LAB — HCG, QUANTITATIVE, PREGNANCY: hCG, Beta Chain, Quant, S: 297 m[IU]/mL — ABNORMAL HIGH (ref <5)

## 2016-03-25 NOTE — Progress Notes (Signed)
Patient presented to office today for HCG stat. Patient currently is not having any pain or bleeding at this time. Lab were drawn and patient has been informed to wait for her results are back to be reviewed by clinical staff and provider. Patient voice understanding.

## 2016-03-30 ENCOUNTER — Other Ambulatory Visit: Payer: Self-pay

## 2016-03-30 DIAGNOSIS — Z3200 Encounter for pregnancy test, result unknown: Secondary | ICD-10-CM

## 2016-03-30 NOTE — Progress Notes (Signed)
Patient presented to office today for a repeat quant. Patient is currently not having any pain at this time. Patient made  we will call her once her results are back and have been reviewed.

## 2016-03-31 ENCOUNTER — Inpatient Hospital Stay (HOSPITAL_COMMUNITY)
Admission: AD | Admit: 2016-03-31 | Discharge: 2016-04-01 | Disposition: A | Discharge status: Home / Self Care | Payer: Medicaid Other | Source: Ambulatory Visit | Attending: Obstetrics and Gynecology | Admitting: Obstetrics and Gynecology

## 2016-03-31 DIAGNOSIS — O26841 Uterine size-date discrepancy, first trimester: Secondary | ICD-10-CM

## 2016-03-31 DIAGNOSIS — Z3A01 Less than 8 weeks gestation of pregnancy: Secondary | ICD-10-CM | POA: Insufficient documentation

## 2016-03-31 DIAGNOSIS — Z3491 Encounter for supervision of normal pregnancy, unspecified, first trimester: Secondary | ICD-10-CM

## 2016-03-31 DIAGNOSIS — Z3490 Encounter for supervision of normal pregnancy, unspecified, unspecified trimester: Secondary | ICD-10-CM

## 2016-03-31 DIAGNOSIS — R102 Pelvic and perineal pain: Secondary | ICD-10-CM | POA: Insufficient documentation

## 2016-03-31 DIAGNOSIS — O26891 Other specified pregnancy related conditions, first trimester: Secondary | ICD-10-CM | POA: Insufficient documentation

## 2016-03-31 LAB — HCG, QUANTITATIVE, PREGNANCY: hCG, Beta Chain, Quant, S: 3031 m[IU]/mL — ABNORMAL HIGH

## 2016-04-01 ENCOUNTER — Inpatient Hospital Stay (HOSPITAL_COMMUNITY): Payer: Medicaid Other

## 2016-04-01 DIAGNOSIS — O26891 Other specified pregnancy related conditions, first trimester: Secondary | ICD-10-CM | POA: Diagnosis not present

## 2016-04-01 DIAGNOSIS — O26841 Uterine size-date discrepancy, first trimester: Secondary | ICD-10-CM

## 2016-04-01 DIAGNOSIS — R102 Pelvic and perineal pain: Secondary | ICD-10-CM | POA: Diagnosis not present

## 2016-04-01 DIAGNOSIS — Z3A01 Less than 8 weeks gestation of pregnancy: Secondary | ICD-10-CM | POA: Diagnosis not present

## 2016-04-01 LAB — URINALYSIS, ROUTINE W REFLEX MICROSCOPIC
Bilirubin Urine: NEGATIVE
Glucose, UA: NEGATIVE mg/dL
HGB URINE DIPSTICK: NEGATIVE
KETONES UR: NEGATIVE mg/dL
LEUKOCYTES UA: NEGATIVE
Nitrite: NEGATIVE
PROTEIN: NEGATIVE mg/dL
Specific Gravity, Urine: 1.02 (ref 1.005–1.030)
pH: 6.5 (ref 5.0–8.0)

## 2016-04-01 MED ORDER — CONCEPT OB 130-92.4-1 MG PO CAPS: 1.0000 | ORAL_CAPSULE | Freq: Every day | ORAL | 12 refills | Status: DC

## 2016-04-01 NOTE — MAU Note (Signed)
Pain in vagina since 10 pm, no bleeding.

## 2016-04-01 NOTE — MAU Provider Note (Signed)
History    First Provider Initiated Contact with Patient 04/01/16 0316      Chief Complaint:  Vaginal Pain   [name] is  20 y.o. [id number] Patient's last menstrual period was 02/11/2016.Marland Kitchen. Patient is here for Vaginal and pelvic pain.   She is 849w1d weeks gestation  by LMP.  [facility] for abdominal pain in pregnancy and pregnancy of unknown anatomic location with normally rising quantitative hCGs. GC/chlamydia cultures, wet prep negative.   Since her last visit, the patient is with new complaint.     ROS Abdominal Pain: None. Denies nausea, vomiting, diarrhea, constipation or urinary complaints. Pelvic pain: Present Vaginal bleeding: none now.   Passage of clots or tissue: None Dizziness: None  Her previous Quantitative HCG values are:  Results for Darryl LentLEBRON-UBIERA, Paula (MRN [id number]) as of 04/01/2016 01:50  Ref. Range 03/23/2016 12:16 03/25/2016 11:56 03/30/2016 11:48  HCG, Beta Chain, Quant, S Latest Units: mIU/mL 124 (H) 297 (H) 3,031.0 (H)    Physical Exam   BP 120/59 (BP Location: Right Arm)   Pulse 81   Temp 98.4 F (36.9 C) (Oral)   Resp 17   Ht 5' 2.6" (1.59 m)   Wt 162 lb 4 oz (73.6 kg)   LMP 02/11/2016   SpO2 99%   BMI 29.11 kg/m  Constitutional: Well-nourished female in no apparent distress. No pallor Neuro: Alert and oriented 4 Cardiovascular: Normal rate Respiratory: Normal effort and rate Abdomen: Soft, nontender Gynecological Exam: examination not indicated  Labs: Results for orders placed or performed during the hospital encounter of 03/31/16 (from the past 24 hour(s))  Urinalysis, Routine w reflex microscopic ([facility])   Collection Time: 04/01/16 12:02 AM  Result Value Ref Range   Color, Urine YELLOW YELLOW   APPearance CLEAR CLEAR   Specific Gravity, Urine 1.020 1.005 - 1.030   pH 6.5 5.0 - 8.0   Glucose, UA NEGATIVE NEGATIVE mg/dL   Hgb urine dipstick  NEGATIVE NEGATIVE   Bilirubin Urine NEGATIVE NEGATIVE   Ketones, ur NEGATIVE NEGATIVE mg/dL   Protein, ur NEGATIVE NEGATIVE mg/dL   Nitrite NEGATIVE NEGATIVE   Leukocytes, UA NEGATIVE NEGATIVE    Ultrasound Studies:   [place] Ob Comp Less 14 Wks  Result Date: 03/23/2016 CLINICAL DATA:  Right lower quadrant abdominal pain. EXAM: OBSTETRIC <14 WK US AND TRANSVAGINAL OB US TECHNIQUE: Both transabdominal and transvaginal ultrasound examinations were performed for complete evaluation of the gestation as well as the maternal uterus, adnexal regions, and pelvic cul-de-sac. Transvaginal technique was performed to assess early pregnancy. COMPARISON:  None. FINDINGS: Intrauterine gestational sac: None visualized Yolk sac:  Not visualized Embryo:  Not visualized Cardiac Activity: Heart Rate:   bpm MSD:   mm    w     d CRL:    mm    w    d                  US EDC: Subchorionic hemorrhage:  None visualized. Maternal uterus/adnexae: No adnexal masses. Small amount of free fluid in the pelvis. IMPRESSION: No intrauterine pregnancy visualized. Differential considerations would include early intrauterine pregnancy too early to visualize, spontaneous abortion, or occult ectopic pregnancy. Recommend close clinical followup and serial quantitative beta HCGs and ultrasounds. Electronically Signed   By: Charlett NoseKevin  Dover M.D.   On: 03/23/2016 14:41   [place] Ob Transvaginal  Result Date: 04/01/2016 CLINICAL DATA:  Pelvic pain. Estimated gestational age by LMP  is 7 weeks 1 day. Quantitative beta HCG is pending. EXAM: TRANSVAGINAL OB ULTRASOUND TECHNIQUE: Transvaginal ultrasound was performed for complete evaluation of the gestation as well as the maternal uterus, adnexal regions, and pelvic cul-de-sac. COMPARISON:  03/23/2016 FINDINGS: Intrauterine gestational sac: A single intrauterine gestational sac is present. Yolk sac:  Yolk sac is visualized. Embryo:  Fetal pole is not identified. Cardiac Activity: Not identified. MSD: 8.5  mm   5  w   4  d Subchorionic hemorrhage:  None visualized. Maternal uterus/adnexae: No myometrial mass lesions identified in the uterus. Right ovary is visualized and appears normal. Left ovary is not identified. No focal mass lesions demonstrated in the visualized left adnexal region. Small amount of free fluid in the pelvis. IMPRESSION: Probable early intrauterine gestational sac with yolk sac, but no fetal pole or cardiac activity yet visualized. Recommend follow-up quantitative B-HCG levels and follow-up US in 14 days to assess viability. This recommendation follows SRU consensus guidelines: Diagnostic Criteria for Nonviable Pregnancy Early in the First Trimester. Malva Limes Engl J Med 2013; 829:5621-30; 369:1443-51. Electronically Signed   By: Burman NievesWilliam  Stevens M.D.   On: 04/01/2016 00:53   [place] Ob Transvaginal  Result Date: 03/23/2016 CLINICAL DATA:  Right lower quadrant abdominal pain. EXAM: OBSTETRIC <14 WK US AND TRANSVAGINAL OB US TECHNIQUE: Both transabdominal and transvaginal ultrasound examinations were performed for complete evaluation of the gestation as well as the maternal uterus, adnexal regions, and pelvic cul-de-sac. Transvaginal technique was performed to assess early pregnancy. COMPARISON:  None. FINDINGS: Intrauterine gestational sac: None visualized Yolk sac:  Not visualized Embryo:  Not visualized Cardiac Activity: Heart Rate:   bpm MSD:   mm    w     d CRL:    mm    w    d                  US EDC: Subchorionic hemorrhage:  None visualized. Maternal uterus/adnexae: No adnexal masses. Small amount of free fluid in the pelvis. IMPRESSION: No intrauterine pregnancy visualized. Differential considerations would include early intrauterine pregnancy too early to visualize, spontaneous abortion, or occult ectopic pregnancy. Recommend close clinical followup and serial quantitative beta HCGs and ultrasounds. Electronically Signed   By: Charlett NoseKevin  Dover M.D.   On: 03/23/2016 14:41    MAU course/MDM: Ultrasound ordered  -  Pelvic pain in early pregnancy with normal IUP confirmed, hemodynamically stable. - Size less than dates  Assessment: IUP Uncertain viability Size less than dates  Plan: Discharge home in stable condition. First trimester precautions Viability, dating ultrasound ordered. List of providers given. Pregnancy verification letter given. Rx prenatal vitamin Follow-up Information    Obstetrician of your choice Follow up.   Why:  Start prenatal care       [facility] [facility] Follow up.   Why:  As needed in emergencies Contact information: [address] [id number] [place] [phone]           Medication List    STOP taking these medications   THERAFLU WARMING RELIEF FLU 12.5-5-325 MG/15ML Liqd Generic drug:  Diphenhydramine-PE-APAP     TAKE these medications   acetaminophen 500 MG tablet Commonly known as:  TYLENOL Take 500 mg by mouth every 6 (six) hours as needed.   CONCEPT OB 130-92.4-1 MG Caps Take 1 tablet by mouth daily.       Dorathy KinsmanVirginia Pasqual Farias, CNM 04/01/2016, 1:57 AM  2/3

## 2016-04-01 NOTE — Discharge Instructions (Signed)
[facility] [employer] for [employer] at [facility]       Phone: [phone]  Center for [employer] at [employer] Phone: [phone]  [facility] for [employer] at [place]  Phone: [phone]  Center for [employer] at [employer]  Phone: [phone]  Center for [facility] at [place]  Phone: [phone]  [facility]       Phone: [phone]  [facility]    Phone: [phone]   [facility]    Phone: [phone]  [facility]    Phone: [phone]  [facility]    Phone: [phone]  [facility]    Phone: [phone]  [facility]       Phone: [phone]   [facility]  Phone: [phone]  [facility]    Phone: [phone]  [facility]   Phone: [phone]  [facility]      Phone: [phone]  [facility]    Phone: [phone]   Dolor abdominal en el embarazo (Abdominal Pain During Pregnancy) El dolor abdominal es frecuente durante el [place]. Generalmente no causa ningn dao. El dolor abdominal puede tener numerosas causas. Algunas causas son ms graves que otras. Ciertas causas de dolor abdominal durante el embarazo se diagnostican fcilmente. A veces, se tarda un tiempo para llegar al diagnstico. Otras veces la causa no se conoce. El dolor abdominal puede estar relacionado con [place] alteracin del [place], o puede deberse a una causa totalmente diferente. Por este motivo, siempre consulte a su mdico cuando sienta molestias abdominales. INSTRUCCIONES PARA EL CUIDADO EN EL HOGAR  Est atenta al dolor para ver si hay cambios. Las siguientes indicaciones ayudarn a Psychologist, educational [employer] pueda sentir:  No Pharmacist, hospital sexuales y no coloque nada dentro de la vagina hasta que los sntomas hayan desaparecido completamente.  Descanse todo lo que pueda [employer] dolor se le haya calmado.  Si siente nuseas, beba lquidos claros. Evite los alimentos slidos mientras sienta malestar o tenga nuseas.  Tome slo medicamentos de venta libre o recetados, segn las indicaciones del mdico.  Cumpla con todas las visitas de control, segn le indique su mdico. SOLICITE ATENCIN MDICA DE INMEDIATO SI:  Tiene un sangrado, prdida de lquidos o elimina tejidos por la vagina.  El dolor o los clicos [place].  Tiene vmitos persistentes.  Comienza a Financial risk analyst al orinar u [employer].  Tiene fiebre.  Nota que los movimientos del beb disminuyen.  Siente intensa debilidad o se marea.  Tiene dificultad para respirar con o sin dolor abdominal.  Siente un dolor de cabeza intenso junto al dolor abdominal.  Shelle Iron secrecin vaginal anormal con dolor abdominal.  Tiene diarrea persistente.  El dolor abdominal sigue o empeora an despus de Field seismologist. ASEGRESE DE QUE:   Comprende estas instrucciones.  Controlar su afeccin.  Recibir ayuda de inmediato si no mejora o si empeora.   Esta informacin no tiene Theme park manager el consejo del mdico. Asegrese de hacerle al mdico cualquier pregunta que tenga.   Document Released: 05/10/2005 Document Revised: 02/28/2013 Elsevier Interactive Patient Education 2016 [employer].   Primer trimestre de Psychiatrist (First Trimester of Pregnancy) El primer trimestre de Psychiatrist se extiende desde la semana1 hasta el final de la semana12 (mes1 al mes3). Una semana despus de que un espermatozoide fecunda un vulo, este se implantar  en la pared uterina. Este embrin comenzar a Camera operator convertirse en un beb. Sus genes y los de su pareja forman el beb. Los genes del varn determinan si ser un nio o una nia. Entre la  semana6 y [place], se forman los ojos y [place], y los latidos del corazn pueden verse en la ecografa. Al final de las 12semanas, todos los rganos del beb estn formados.  Ahora que est embarazada, querr hacer todo lo que est a su alcance para tener un beb sano. Dos de las cosas ms importantes son Winferd Humphrey buena atencin prenatal y seguir las indicaciones del mdico. La atencin prenatal incluye toda la asistencia mdica que usted recibe antes del nacimiento del beb. Esta ayudar a prevenir, Engineer, manufacturing y tratar cualquier problema durante el embarazo y [place]. CAMBIOS EN EL ORGANISMO Su organismo atraviesa por muchos cambios durante el [place], y estos varan de Neomia Dear mujer a Educational psychologist.   Al principio, puede aumentar o bajar algunos kilos.  Puede tener Programme researcher, broadcasting/film/video (nuseas) y vomitar. Si no puede controlar los vmitos, llame al mdico.  Puede cansarse con facilidad.  Es posible que tenga dolores de cabeza que pueden aliviarse con los medicamentos que el mdico le permita tomar.  Puede orinar con mayor frecuencia. El dolor al orinar puede significar que usted tiene una infeccin de la vejiga.  Debido al Vanetta Mulders, puede tener acidez estomacal.  Puede estar estreida, ya que ciertas hormonas enlentecen los movimientos de los msculos que [employer] desechos a travs de los intestinos.  Pueden aparecer hemorroides o abultarse e hincharse las venas (venas varicosas).  Las [employer] pueden empezar a Government social research officer y Emergency planning/management officer. Los pezones pueden sobresalir ms, y el tejido que los rodea (areola) tornarse ms oscuro.  Las Veterinary surgeon y estar sensibles al cepillado y al hilo dental.  Pueden aparecer zonas oscuras o manchas (cloasma, mscara del Psychiatrist) en el rostro que probablemente se atenuarn despus del nacimiento del beb.  Los perodos menstruales se interrumpirn.  Tal vez no tenga apetito.  Puede sentir un fuerte deseo de consumir ciertos alimentos.  Puede  tener cambios a Theatre manager a da, por ejemplo, por momentos puede estar emocionada por el Psychiatrist y por otros preocuparse porque algo pueda salir mal con el embarazo o el beb.  Tendr sueos ms vvidos y extraos.  Tal vez haya cambios en el cabello que pueden incluir su engrosamiento, crecimiento rpido y cambios en la textura. A algunas mujeres tambin se les cae el cabello durante o despus del [place], o tienen el cabello seco o fino. Lo ms probable es que el cabello se le normalice despus del nacimiento del beb. QU DEBE ESPERAR EN LAS CONSULTAS PRENATALES Durante una visita prenatal de rutina:  La pesarn para asegurarse de que usted y el beb estn creciendo normalmente.  Le controlarn la presin arterial.  Le medirn el abdomen para controlar el desarrollo del beb.  Se escucharn los latidos cardacos a partir de la semana10 o la12 de embarazo, aproximadamente.  Se analizarn los resultados de los estudios solicitados en visitas anteriores. El mdico puede preguntarle:  Cmo se siente.  Si siente los movimientos del beb.  Si ha tenido sntomas anormales, como prdida de lquido, [place], dolores de cabeza intensos o clicos abdominales.  Si est consumiendo algn producto que contenga tabaco, como cigarrillos, tabaco de Theatre manager y Administrator, Civil Service.  Si tiene [employer]. Otros estudios que pueden realizarse durante el primer trimestre incluyen lo siguiente:  Anlisis de  sangre para determinar el tipo de sangre y Engineer, manufacturingdetectar la presencia de infecciones previas. Adems, se los usar para controlar si los niveles de hierro son bajos (anemia) y Chief Strategy Officerdeterminar los anticuerpos Rh. En una etapa ms avanzada del [place]em[place], se harn anlisis de sangre para saber si tiene diabetes, junto con otros estudios si surgen problemas.  Anlisis de orina para detectar infecciones, diabetes o protenas en la orina.  Una ecografa para confirmar que el beb crece y se  desarrolla correctamente.  Una amniocentesis para diagnosticar posibles problemas genticos.  Estudios del feto para descartar espina bfida y sndrome de [place].  Es posible que necesite otras pruebas adicionales.  Prueba del VIH (virus de inmunodeficiencia humana). Los exmenes prenatales de rutina incluyen la prueba de deteccin del VIH, a menos que decida no Futures traderrealizrsela. INSTRUCCIONES PARA EL CUIDADO EN EL HOGAR  Medicamentos:  Siga las indicaciones del mdico en relacin con el uso de medicamentos. Durante el embarazo, hay medicamentos que pueden tomarse y otros que no.  Tome las vitaminas prenatales como se le indic.  Si est estreida, tome un laxante suave, si el mdico lo [place] Arab Jamahiriyaautoriza. Dieta  Consuma alimentos balanceados. Elija alimentos variados, como carne o protenas de origen vegetal, pescado, leche y productos lcteos descremados, verduras, frutas y panes y Radiation protection practitionercereales integrales. El mdico la ayudar a Production assistant, radiodeterminar la cantidad de peso que puede Thomasvilleaumentar.  No coma carne cruda ni quesos sin cocinar. Estos elementos contienen bacterias que pueden causar defectos congnitos en el beb.  La ingesta diaria de cuatro o cinco comidas pequeas en lugar de tres comidas abundantes puede ayudar a Yahooaliviar las nuseas y los vmitos. Si empieza a tener nuseas, comer algunas [address] Avenuegalletas saladas puede ser de [place] Flatayuda. Beber lquidos National Cityentre las comidas en lugar de tomarlos durante las comidas tambin puede ayudar a Optician, dispensingcalmar las nuseas y los vmitos.  Si est estreida, consuma alimentos con alto contenido de Masonfibra, como verduras y frutas frescas, y Radiation protection practitionercereales integrales. Beba suficiente lquido para Photographermantener la orina clara o de color amarillo plido. Actividad y Landscape architectejercicios  Haga ejercicio solamente como se lo haya indicado el mdico. El ejercicio la ayudar a:  Art gallery managerControlar el peso.  Mantenerse en forma.  Estar preparada para el trabajo de parto y [place]el parto.  Los dolores, los clicos en la parte baja del  abdomen o los calambres en la cintura son un buen indicio de que debe dejar de Corporate treasurerhacer ejercicios. Consulte al mdico antes de seguir haciendo ejercicios normales.  Intente no estar de pie FedExdurante mucho tiempo. Mueva las piernas con frecuencia si debe estar de pie en un lugar durante mucho tiempo.  Evite levantar pesos Fortune Brandsexcesivos.  Use zapatos de tacones bajos y Brazilmantenga una buena postura.  Puede seguir teniendo The St. Paul Travelersrelaciones sexuales, excepto que el mdico le indique lo contrario. Alivio del dolor o las molestias  Use un sostn que le brinde buen soporte si siente dolor a la palpacin Mattelen las mamas.  Dese baos de asiento con agua tibia para Engineer, materialsaliviar el dolor o las molestias causadas por las hemorroides. Use crema antihemorroidal si el mdico se lo permite.  Descanse con las piernas elevadas si tiene calambres o dolor de cintura.  Si tiene venas varicosas en las piernas, use medias de descanso. Eleve los pies durante 15minutos, 3 o 4veces por da. Limite la cantidad de sal en su dieta. Cuidados prenatales  Programe las visitas prenatales para la semana12 de [place]embarazo. Generalmente se programan cada mes al principio y se hacen ms frecuentes en los  2 ltimos meses antes del parto.  Escriba sus preguntas. Llvelas cuando concurra a las visitas prenatales.  Concurra a todas las visitas prenatales como se lo haya indicado el mdico. Seguridad  Colquese el cinturn de seguridad cuando conduzca.  Haga una lista de los nmeros de telfono de Associate Professoremergencia, que W. R. Berkleyincluya los nmeros de telfono de familiares, Casa Blancaamigos, el hospital y los departamentos de polica y bomberos. Consejos generales  Pdale al mdico que la derive a clases de educacin prenatal en su localidad. Debe comenzar a tomar las clases antes de Cytogeneticistentrar en el mes6 de embarazo.  Pida ayuda si tiene necesidades nutricionales o de asesoramiento Academic librariandurante el embarazo. El mdico puede aconsejarla o derivarla a especialistas para que la ayuden  con diferentes necesidades.  No se d baos de inmersin en agua caliente, baos turcos ni saunas.  No se haga duchas vaginales ni use tampones o toallas higinicas perfumadas.  No mantenga las piernas cruzadas durante [place] Bethanymucho tiempo.  Evite el contacto con las bandejas sanitarias de los gatos y la tierra que estos animales usan. Estos elementos contienen bacterias que pueden causar defectos congnitos al beb y la posible prdida del feto debido a un aborto espontneo o muerte fetal.  No fume, no consuma hierbas ni medicamentos que no hayan sido recetados por el mdico. Las sustancias qumicas que estos productos contienen afectan la formacin y el desarrollo del beb.  No consuma ningn producto que contenga tabaco, lo que incluye cigarrillos, tabaco de Theatre managermascar y Administrator, Civil Servicecigarrillos electrnicos. Si necesita ayuda para dejar de fumar, consulte al American Expressmdico. Puede recibir asesoramiento y otro tipo de recursos para dejar de fumar.  Programe una cita con el dentista. En su casa, lvese los dientes con un cepillo dental blando y psese el hilo dental con suavidad. SOLICITE ATENCIN MDICA SI:   Tiene mareos.  Siente clicos leves, presin en la pelvis o dolor persistente en el abdomen.  Tiene nuseas, vmitos o diarrea persistentes.  Tiene secrecin vaginal con mal olor.  Siente dolor al ConocoPhillipsorinar.  Tiene el rostro, las Bremondmanos, las piernas o los tobillos ms hinchados. SOLICITE ATENCIN MDICA DE INMEDIATO SI:   Tiene fiebre.  Tiene una prdida de lquido por la vagina.  Tiene sangrado o pequeas prdidas vaginales.  Siente dolor intenso o clicos en el abdomen.  Sube o baja de peso rpidamente.  Vomita sangre de color rojo brillante o material que parezca granos de caf.  Ha estado expuesta a la rubola y no ha sufrido la enfermedad.  Ha estado expuesta a la quinta enfermedad o a la varicela.  Tiene un dolor de cabeza intenso.  Le falta el aire.  Sufre cualquier tipo de traumatismo,  por ejemplo, debido a una cada o un accidente automovilstico.   Esta informacin no tiene Theme park managercomo fin reemplazar el consejo del mdico. Asegrese de hacerle al mdico cualquier pregunta que tenga.   Document Released: 02/17/2005 Document Revised: 05/31/2014 Elsevier Interactive Patient Education Yahoo[employer] Inc2016 Elsevier Inc.

## 2016-04-08 ENCOUNTER — Ambulatory Visit (HOSPITAL_COMMUNITY)
Admission: RE | Admit: 2016-04-08 | Discharge: 2016-04-08 | Disposition: A | Discharge status: Home / Self Care | Payer: Medicaid Other | Source: Ambulatory Visit | Attending: Advanced Practice Midwife | Admitting: Advanced Practice Midwife

## 2016-04-08 DIAGNOSIS — Z3A01 Less than 8 weeks gestation of pregnancy: Secondary | ICD-10-CM | POA: Insufficient documentation

## 2016-04-08 DIAGNOSIS — R102 Pelvic and perineal pain: Secondary | ICD-10-CM | POA: Insufficient documentation

## 2016-04-08 DIAGNOSIS — O26891 Other specified pregnancy related conditions, first trimester: Secondary | ICD-10-CM | POA: Insufficient documentation

## 2016-04-08 DIAGNOSIS — O26841 Uterine size-date discrepancy, first trimester: Secondary | ICD-10-CM

## 2016-04-08 DIAGNOSIS — Z3491 Encounter for supervision of normal pregnancy, unspecified, first trimester: Secondary | ICD-10-CM

## 2016-04-08 NOTE — Progress Notes (Signed)
Patient brought to MAU to discuss ultrasound results. Stat f/u ultrasound ordered for fetal viability. [facility] but brought here since office is closed.  Today's ultrasound shows SIUP measuring 1132w1d with cardiac activity.  Patient denies abdominal pain or vaginal bleeding. Discussed results of ultrasound with patient via Spanish interpreter.  [facility]  Judeth HornErin Martinique Pizzimenti, NP

## 2016-04-16 DIAGNOSIS — Z3490 Encounter for supervision of normal pregnancy, unspecified, unspecified trimester: Secondary | ICD-10-CM

## 2016-04-21 ENCOUNTER — Encounter (HOSPITAL_COMMUNITY): Payer: Self-pay | Admitting: *Deleted

## 2016-04-21 ENCOUNTER — Inpatient Hospital Stay (HOSPITAL_COMMUNITY)
Admission: AD | Admit: 2016-04-21 | Discharge: 2016-04-21 | Disposition: A | Discharge status: Home / Self Care | Payer: Medicaid Other | Source: Ambulatory Visit | Attending: Obstetrics & Gynecology | Admitting: Obstetrics & Gynecology

## 2016-04-21 DIAGNOSIS — O209 Hemorrhage in early pregnancy, unspecified: Secondary | ICD-10-CM | POA: Diagnosis not present

## 2016-04-21 DIAGNOSIS — R112 Nausea with vomiting, unspecified: Secondary | ICD-10-CM | POA: Diagnosis not present

## 2016-04-21 DIAGNOSIS — R109 Unspecified abdominal pain: Secondary | ICD-10-CM | POA: Insufficient documentation

## 2016-04-21 DIAGNOSIS — O218 Other vomiting complicating pregnancy: Secondary | ICD-10-CM | POA: Diagnosis not present

## 2016-04-21 DIAGNOSIS — Z3A08 8 weeks gestation of pregnancy: Secondary | ICD-10-CM | POA: Insufficient documentation

## 2016-04-21 DIAGNOSIS — O26891 Other specified pregnancy related conditions, first trimester: Secondary | ICD-10-CM | POA: Insufficient documentation

## 2016-04-21 DIAGNOSIS — Z87891 Personal history of nicotine dependence: Secondary | ICD-10-CM | POA: Insufficient documentation

## 2016-04-21 DIAGNOSIS — O219 Vomiting of pregnancy, unspecified: Secondary | ICD-10-CM

## 2016-04-21 LAB — URINALYSIS, ROUTINE W REFLEX MICROSCOPIC
Bilirubin Urine: NEGATIVE
GLUCOSE, UA: NEGATIVE mg/dL
HGB URINE DIPSTICK: NEGATIVE
Ketones, ur: NEGATIVE mg/dL
Nitrite: NEGATIVE
PH: 6 (ref 5.0–8.0)
Protein, ur: NEGATIVE mg/dL
Specific Gravity, Urine: <1.005 — ABNORMAL LOW (ref 1.005–1.030)

## 2016-04-21 LAB — URINE MICROSCOPIC-ADD ON
BACTERIA UA: NONE SEEN
RBC / HPF: NONE SEEN RBC/hpf (ref 0–5)

## 2016-04-21 MED ORDER — PROMETHAZINE HCL 25 MG PO TABS: 12.5000 mg | ORAL_TABLET | Freq: Four times a day (QID) | ORAL | 2 refills | Status: DC | PRN

## 2016-04-21 NOTE — Progress Notes (Signed)
Not in lobby when first called

## 2016-04-21 NOTE — Discharge Instructions (Signed)
Dolor abdominal durante el embarazo  (Abdominal Pain During Pregnancy)  El dolor de vientre (abdominal) es habitual durante el embarazo. Generalmente no se trata de un problema grave. Otras veces puede ser un signo de que algo no anda bien. Siempre comuníquese con su médico si tiene dolor abdominal.  CUIDADOS EN EL HOGAR  Controle el dolor para ver si hay cambios. Las indicaciones que siguen pueden ayudarla a sentirse mejor:  · Notenga sexo (relaciones sexuales) ni se coloque nada dentro de la vagina hasta que se sienta mejor.  · Haga reposo hasta que el dolor se calme.  · Si siente ganas de vomitar (náuseas ) beba líquidos claros. No consuma alimentos sólidos hasta que se sienta mejor.  · Sólo tome los medicamentos que le haya indicado su médico.  · Cumpla con las visitas al médico según las indicaciones.  SOLICITE AYUDA DE INMEDIATO SI:  · Tiene un sangrado, pierde líquido o elimina trozos de tejido por la vagina.  · Siente más dolor o cólicos.  · Comienza a vomitar.  · Siente dolor al orinar u observa sangre en la orina.  · Tiene fiebre.  · No siente que el bebé se mueva mucho.  · Se siente muy débil o cree que va a desmayarse.  · Tiene dificultad para respirar con o sin dolor en el vientre.  · Siente un dolor de cabeza muy intenso y dolor en el vientre.  · Observa que sale un líquido por la vagina y tiene dolor abdominal.  · La materia fecal es líquida (diarrea).  · El dolor en el viente no desaparece, o empeora, luego de hacer reposo.    ASEGÚRESE DE QUE:  · Comprende estas instrucciones.  · Controlará su afección.  · Recibirá ayuda de inmediato si no mejora o si empeora.    Esta información no tiene como fin reemplazar el consejo del médico. Asegúrese de hacerle al médico cualquier pregunta que tenga.  Document Released: 01/20/2011 Document Revised: 09/01/2015 Document Reviewed: 12/07/2012  Elsevier Interactive Patient Education © 2017 Elsevier Inc.

## 2016-04-21 NOTE — MAU Provider Note (Signed)
History     CSN: [id number]  Arrival date and time: 04/21/16 1251   First Provider Initiated Contact with Patient 04/21/16 1605      Chief Complaint  Patient presents with  . Abdominal Pain   HPI   [name] is a 20 y.o.female G3P0020 @ 6166w2d here in MAU with vaginal bleeding and abdominal pain.  Last night she started having heavy vaginal bleeding; passing blood clots. She is not having any bleeding today.   The abdominal pain started at the same time. The pain is located in the lower part of her abdomen. The pain is constant. She has not taken anything for the pain.    OB History    Gravida Para Term Preterm AB Living   3       2     SAB TAB Ectopic Multiple Live Births   2              Past Medical History:  Diagnosis Date  . Pyelonephritis     Past Surgical History:  Procedure Laterality Date  . DILATION AND CURETTAGE OF UTERUS     x2    Family History  Problem Relation Age of Onset  . Diabetes Paternal Uncle   . Cancer Maternal Grandmother   . Cancer Paternal Grandmother     liver    Social History  Substance Use Topics  . Smoking status: Former Games developermoker  . Smokeless tobacco: Never Used  . Alcohol use Yes     Comment: Last drank in Oct/2017, per pt drank 20 beers     Allergies:  Allergies  Allergen Reactions  . Latex Itching    Prescriptions Prior to Admission  Medication Sig Dispense Refill Last Dose  . acetaminophen (TYLENOL) 500 MG tablet Take 500 mg by mouth every 6 (six) hours as needed.   03/31/2016 at Unknown time  . Prenat w/o A Vit-FeFum-FePo-FA (CONCEPT OB) 130-92.4-1 MG CAPS Take 1 tablet by mouth daily. 30 capsule 12    Results for orders placed or performed during the hospital encounter of 04/21/16 (from the past 48 hour(s))  Urinalysis, Routine w reflex microscopic ([facility])     Status: Abnormal   Collection Time: 04/21/16  2:00 PM  Result Value Ref Range   Color, Urine STRAW (A) YELLOW   APPearance CLEAR  CLEAR   Specific Gravity, Urine <1.005 (L) 1.005 - 1.030   pH 6.0 5.0 - 8.0   Glucose, UA NEGATIVE NEGATIVE mg/dL   Hgb urine dipstick NEGATIVE NEGATIVE   Bilirubin Urine NEGATIVE NEGATIVE   Ketones, ur NEGATIVE NEGATIVE mg/dL   Protein, ur NEGATIVE NEGATIVE mg/dL   Nitrite NEGATIVE NEGATIVE   Leukocytes, UA TRACE (A) NEGATIVE  Urine microscopic-add on     Status: Abnormal   Collection Time: 04/21/16  2:00 PM  Result Value Ref Range   Squamous Epithelial / LPF 0-5 (A) NONE SEEN   WBC, UA 0-5 0 - 5 WBC/hpf   RBC / HPF NONE SEEN 0 - 5 RBC/hpf   Bacteria, UA NONE SEEN NONE SEEN    Review of Systems  Gastrointestinal: Positive for abdominal pain, nausea and vomiting.  Genitourinary: Positive for dysuria.   Physical Exam   Blood pressure (!) 101/48, pulse 61, temperature 98.2 F (36.8 C), temperature source Oral, resp. rate 18, weight 161 lb (73 kg), last menstrual period 02/11/2016.  Physical Exam  Constitutional: She is oriented to person, place, and time. She appears well-developed and well-nourished. No distress.  HENT:  Head: Normocephalic.  Respiratory: Effort normal.  GI: Soft.  Genitourinary:  Genitourinary Comments: Vagina - Small amount of white vaginal discharge, no odor  Cervix - No contact bleeding, no active bleeding  Bimanual exam: Cervix closed Uterus non tender, gravid  Adnexa non tender, no masses bilaterally Chaperone present for exam.   Musculoskeletal: Normal range of motion.  Neurological: She is alert and oriented to person, place, and time.  Skin: Skin is warm. She is not diaphoretic.  Psychiatric: Her behavior is normal.    MAU Course  Procedures  None  MDM  No blood noted on exam + fetal heart rate and fetal movement noted on bedside US: confirmed with Donette LarryMelanie Bhambri CNM. Patient reassured   Assessment and Plan   A:  1. Abdominal pain in pregnancy, first trimester   2. Nausea and vomiting during pregnancy prior to [redacted] weeks gestation      P:  Discharge home in stable condition  Continue phenergan as needed for N/V Return to MAU if symptoms worsen First trimester warning signs discussed Small, frequent meals   Duane LopeJennifer I Nizar Cutler, NP 04/24/2016 10:06 PM

## 2016-04-21 NOTE — MAU Note (Addendum)
Had pain day before yesterday.  Bleeding yesterday, like a period, none today. Pain started again today, sharp pains across lower abd. Throwing up, can't keep anything down.  Had gone to bathroom when first arrived, unable to void now.

## 2016-04-27 ENCOUNTER — Other Ambulatory Visit (HOSPITAL_COMMUNITY)
Admission: RE | Admit: 2016-04-27 | Discharge: 2016-04-27 | Disposition: A | Discharge status: Home / Self Care | Payer: Medicaid Other | Source: Ambulatory Visit | Attending: Obstetrics and Gynecology | Admitting: Obstetrics and Gynecology

## 2016-04-27 ENCOUNTER — Ambulatory Visit (INDEPENDENT_AMBULATORY_CARE_PROVIDER_SITE_OTHER): Payer: Medicaid Other | Admitting: Obstetrics and Gynecology

## 2016-04-27 ENCOUNTER — Encounter: Payer: Self-pay | Admitting: Obstetrics and Gynecology

## 2016-04-27 VITALS — BP 100/80 | HR 79 | Wt 158.2 lb

## 2016-04-27 DIAGNOSIS — Z01411 Encounter for gynecological examination (general) (routine) with abnormal findings: Secondary | ICD-10-CM | POA: Insufficient documentation

## 2016-04-27 DIAGNOSIS — Z3400 Encounter for supervision of normal first pregnancy, unspecified trimester: Secondary | ICD-10-CM

## 2016-04-27 DIAGNOSIS — Z23 Encounter for immunization: Secondary | ICD-10-CM | POA: Diagnosis not present

## 2016-04-27 DIAGNOSIS — Z3401 Encounter for supervision of normal first pregnancy, first trimester: Secondary | ICD-10-CM

## 2016-04-27 DIAGNOSIS — Z113 Encounter for screening for infections with a predominantly sexual mode of transmission: Secondary | ICD-10-CM | POA: Insufficient documentation

## 2016-04-27 MED ORDER — ONDANSETRON HCL 4 MG PO TABS: 4.0000 mg | ORAL_TABLET | Freq: Three times a day (TID) | ORAL | 0 refills | Status: DC | PRN

## 2016-04-27 NOTE — Patient Instructions (Signed)
Primer trimestre de embarazo (First Trimester of Pregnancy) El primer trimestre de embarazo se extiende desde la semana1 hasta el final de la semana12 (mes1 al mes3). Una semana despus de que un espermatozoide fecunda un vulo, este se implantar en la pared uterina. Este embrin comenzar a desarrollarse hasta convertirse en un beb. Sus genes y los de su pareja forman el beb. Los genes del varn determinan si ser un nio o una nia. Entre la semana6 y la8, se forman los ojos y el rostro, y los latidos del corazn pueden verse en la ecografa. Al final de las 12semanas, todos los rganos del beb estn formados. Ahora que est embarazada, querr hacer todo lo que est a su alcance para tener un beb sano. Dos de las cosas ms importantes son tener una buena atencin prenatal y seguir las indicaciones del mdico. La atencin prenatal incluye toda la asistencia mdica que usted recibe antes del nacimiento del beb. Esta ayudar a prevenir, detectar y tratar cualquier problema durante el embarazo y el parto. CAMBIOS EN EL ORGANISMO Su organismo atraviesa por muchos cambios durante el embarazo, y estos varan de una mujer a otra.  Al principio, puede aumentar o bajar algunos kilos.  Puede tener malestar estomacal (nuseas) y vomitar. Si no puede controlar los vmitos, llame al mdico.  Puede cansarse con facilidad.  Es posible que tenga dolores de cabeza que pueden aliviarse con los medicamentos que el mdico le permita tomar.  Puede orinar con mayor frecuencia. El dolor al orinar puede significar que usted tiene una infeccin de la vejiga.  Debido al embarazo, puede tener acidez estomacal.  Puede estar estreida, ya que ciertas hormonas enlentecen los movimientos de los msculos que empujan los desechos a travs de los intestinos.  Pueden aparecer hemorroides o abultarse e hincharse las venas (venas varicosas).  Las mamas pueden empezar a agrandarse y estar sensibles. Los pezones  pueden sobresalir ms, y el tejido que los rodea (areola) tornarse ms oscuro.  Las encas pueden sangrar y estar sensibles al cepillado y al hilo dental.  Pueden aparecer zonas oscuras o manchas (cloasma, mscara del embarazo) en el rostro que probablemente se atenuarn despus del nacimiento del beb.  Los perodos menstruales se interrumpirn.  Tal vez no tenga apetito.  Puede sentir un fuerte deseo de consumir ciertos alimentos.  Puede tener cambios a nivel emocional da a da, por ejemplo, por momentos puede estar emocionada por el embarazo y por otros preocuparse porque algo pueda salir mal con el embarazo o el beb.  Tendr sueos ms vvidos y extraos.  Tal vez haya cambios en el cabello que pueden incluir su engrosamiento, crecimiento rpido y cambios en la textura. A algunas mujeres tambin se les cae el cabello durante o despus del embarazo, o tienen el cabello seco o fino. Lo ms probable es que el cabello se le normalice despus del nacimiento del beb. QU DEBE ESPERAR EN LAS CONSULTAS PRENATALES Durante una visita prenatal de rutina:  La pesarn para asegurarse de que usted y el beb estn creciendo normalmente.  Le controlarn la presin arterial.  Le medirn el abdomen para controlar el desarrollo del beb.  Se escucharn los latidos cardacos a partir de la semana10 o la12 de embarazo, aproximadamente.  Se analizarn los resultados de los estudios solicitados en visitas anteriores. El mdico puede preguntarle:  Cmo se siente.  Si siente los movimientos del beb.  Si ha tenido sntomas anormales, como prdida de lquido, sangrado, dolores de cabeza intensos o clicos abdominales.    Si est consumiendo algn producto que contenga tabaco, como cigarrillos, tabaco de mascar y cigarrillos electrnicos.  Si tiene alguna pregunta. Otros estudios que pueden realizarse durante el primer trimestre incluyen lo siguiente:  Anlisis de sangre para determinar el tipo  de sangre y detectar la presencia de infecciones previas. Adems, se los usar para controlar si los niveles de hierro son bajos (anemia) y determinar los anticuerpos Rh. En una etapa ms avanzada del embarazo, se harn anlisis de sangre para saber si tiene diabetes, junto con otros estudios si surgen problemas.  Anlisis de orina para detectar infecciones, diabetes o protenas en la orina.  Una ecografa para confirmar que el beb crece y se desarrolla correctamente.  Una amniocentesis para diagnosticar posibles problemas genticos.  Estudios del feto para descartar espina bfida y sndrome de Down.  Es posible que necesite otras pruebas adicionales.  Prueba del VIH (virus de inmunodeficiencia humana). Los exmenes prenatales de rutina incluyen la prueba de deteccin del VIH, a menos que decida no realizrsela. INSTRUCCIONES PARA EL CUIDADO EN EL HOGAR Medicamentos:  Siga las indicaciones del mdico en relacin con el uso de medicamentos. Durante el embarazo, hay medicamentos que pueden tomarse y otros que no.  Tome las vitaminas prenatales como se le indic.  Si est estreida, tome un laxante suave, si el mdico lo autoriza. Dieta  Consuma alimentos balanceados. Elija alimentos variados, como carne o protenas de origen vegetal, pescado, leche y productos lcteos descremados, verduras, frutas y panes y cereales integrales. El mdico la ayudar a determinar la cantidad de peso que puede aumentar.  No coma carne cruda ni quesos sin cocinar. Estos elementos contienen bacterias que pueden causar defectos congnitos en el beb.  La ingesta diaria de cuatro o cinco comidas pequeas en lugar de tres comidas abundantes puede ayudar a aliviar las nuseas y los vmitos. Si empieza a tener nuseas, comer algunas galletas saladas puede ser de ayuda. Beber lquidos entre las comidas en lugar de tomarlos durante las comidas tambin puede ayudar a calmar las nuseas y los vmitos.  Si est  estreida, consuma alimentos con alto contenido de fibra, como verduras y frutas frescas, y cereales integrales. Beba suficiente lquido para mantener la orina clara o de color amarillo plido. Actividad y ejercicios  Haga ejercicio solamente como se lo haya indicado el mdico. El ejercicio la ayudar a: ? Controlar el peso. ? Mantenerse en forma. ? Estar preparada para el trabajo de parto y el parto.  Los dolores, los clicos en la parte baja del abdomen o los calambres en la cintura son un buen indicio de que debe dejar de hacer ejercicios. Consulte al mdico antes de seguir haciendo ejercicios normales.  Intente no estar de pie durante mucho tiempo. Mueva las piernas con frecuencia si debe estar de pie en un lugar durante mucho tiempo.  Evite levantar pesos excesivos.  Use zapatos de tacones bajos y mantenga una buena postura.  Puede seguir teniendo relaciones sexuales, excepto que el mdico le indique lo contrario. Alivio del dolor o las molestias  Use un sostn que le brinde buen soporte si siente dolor a la palpacin en las mamas.  Dese baos de asiento con agua tibia para aliviar el dolor o las molestias causadas por las hemorroides. Use crema antihemorroidal si el mdico se lo permite.  Descanse con las piernas elevadas si tiene calambres o dolor de cintura.  Si tiene venas varicosas en las piernas, use medias de descanso. Eleve los pies durante 15minutos, 3 o 4veces por   da. Limite la cantidad de sal en su dieta. Cuidados prenatales  Programe las visitas prenatales para la semana12 de embarazo. Generalmente se programan cada mes al principio y se hacen ms frecuentes en los 2 ltimos meses antes del parto.  Escriba sus preguntas. Llvelas cuando concurra a las visitas prenatales.  Concurra a todas las visitas prenatales como se lo haya indicado el mdico. Seguridad  Colquese el cinturn de seguridad cuando conduzca.  Haga una lista de los nmeros de telfono de  emergencia, que incluya los nmeros de telfono de familiares, amigos, el hospital y los departamentos de polica y bomberos. Consejos generales  Pdale al mdico que la derive a clases de educacin prenatal en su localidad. Debe comenzar a tomar las clases antes de entrar en el mes6 de embarazo.  Pida ayuda si tiene necesidades nutricionales o de asesoramiento durante el embarazo. El mdico puede aconsejarla o derivarla a especialistas para que la ayuden con diferentes necesidades.  No se d baos de inmersin en agua caliente, baos turcos ni saunas.  No se haga duchas vaginales ni use tampones o toallas higinicas perfumadas.  No mantenga las piernas cruzadas durante mucho tiempo.  Evite el contacto con las bandejas sanitarias de los gatos y la tierra que estos animales usan. Estos elementos contienen bacterias que pueden causar defectos congnitos al beb y la posible prdida del feto debido a un aborto espontneo o muerte fetal.  No fume, no consuma hierbas ni medicamentos que no hayan sido recetados por el mdico. Las sustancias qumicas que estos productos contienen afectan la formacin y el desarrollo del beb.  No consuma ningn producto que contenga tabaco, lo que incluye cigarrillos, tabaco de mascar y cigarrillos electrnicos. Si necesita ayuda para dejar de fumar, consulte al mdico. Puede recibir asesoramiento y otro tipo de recursos para dejar de fumar.  Programe una cita con el dentista. En su casa, lvese los dientes con un cepillo dental blando y psese el hilo dental con suavidad. SOLICITE ATENCIN MDICA SI:  Tiene mareos.  Siente clicos leves, presin en la pelvis o dolor persistente en el abdomen.  Tiene nuseas, vmitos o diarrea persistentes.  Tiene secrecin vaginal con mal olor.  Siente dolor al orinar.  Tiene el rostro, las manos, las piernas o los tobillos ms hinchados.  SOLICITE ATENCIN MDICA DE INMEDIATO SI:  Tiene fiebre.  Tiene una prdida de  lquido por la vagina.  Tiene sangrado o pequeas prdidas vaginales.  Siente dolor intenso o clicos en el abdomen.  Sube o baja de peso rpidamente.  Vomita sangre de color rojo brillante o material que parezca granos de caf.  Ha estado expuesta a la rubola y no ha sufrido la enfermedad.  Ha estado expuesta a la quinta enfermedad o a la varicela.  Tiene un dolor de cabeza intenso.  Le falta el aire.  Sufre cualquier tipo de traumatismo, por ejemplo, debido a una cada o un accidente automovilstico.  Esta informacin no tiene como fin reemplazar el consejo del mdico. Asegrese de hacerle al mdico cualquier pregunta que tenga. Document Released: 02/17/2005 Document Revised: 05/31/2014 Document Reviewed: 03/20/2013 Elsevier Interactive Patient Education  2017 [employer]vier Inc[employer]  

## 2016-04-27 NOTE — Progress Notes (Signed)
   PRENATAL VISIT NOTE  Subjective:  Paula LentJulissa Gamble is a 20 y.o. G3P0020 at 4970w1d being seen today for ongoing prenatal care.  She is currently monitored for the following issues for this low-risk pregnancy and has Encounter for supervision of normal pregnancy, antepartum on her problem list.  Patient reports no complaints.   .  .  Movement: Present. Denies leaking of fluid.   The following portions of the patient's history were reviewed and updated as appropriate: allergies, current medications, past family history, past medical history, past social history, past surgical history and problem list. Problem list updated.  Objective:   Vitals:   04/27/16 0931  BP: 100/80  Pulse: 79  Weight: 158 lb 3.2 oz (71.8 kg)    Fetal Status:     Movement: Present     General:  Alert, oriented and cooperative. Patient is in no acute distress.  Skin: Skin is warm and dry. No rash noted.   Cardiovascular: Normal heart rate noted  Respiratory: Normal respiratory effort, no problems with respiration noted  Abdomen: Soft, gravid, appropriate for gestational age. Pain/Pressure: Present     Pelvic:  Cervical exam deferred        Extremities: Normal range of motion.     Mental Status: Normal mood and affect. Normal behavior. Normal judgment and thought content.   Assessment and Plan:  Pregnancy: G3P0020 at 6170w1d  1. Supervision of normal first pregnancy, antepartum Patient presenting to care. Dating by LMP and  - Prenatal Profile - Hemoglobinopathy Evaluation - Pain Mgmt, Profile 6 Conf w/o mM, U - Flu Vaccine QUAD 36+ mos IM (Fluarix, Quad PF) - Culture, OB Urine - Cytology - PAP - GC[facility]Chlamydia probe amp [facility]  Term labor symptoms and general obstetric precautions including but not limited to vaginal bleeding, contractions, leaking of fluid and fetal movement were reviewed in detail with the patient. Please refer to After Visit Summary for other counseling  recommendations.  Return in about 4 weeks (around 05/25/2016) for LOB.   Lorne SkeensNicholas Michael Paula Nolley, MD

## 2016-04-28 LAB — PAIN MGMT, PROFILE 6 CONF W/O MM, U
6 Acetylmorphine: NEGATIVE ng/mL (ref <10)
Alcohol Metabolites: NEGATIVE ng/mL (ref <500)
Amphetamines: NEGATIVE ng/mL (ref <500)
BARBITURATES: NEGATIVE ng/mL (ref <300)
BENZODIAZEPINES: NEGATIVE ng/mL (ref <100)
COCAINE METABOLITE: NEGATIVE ng/mL (ref <150)
CREATININE: 71.4 mg/dL (ref >=20.0)
MARIJUANA METABOLITE: NEGATIVE ng/mL (ref <20)
METHADONE METABOLITE: NEGATIVE ng/mL (ref <100)
OXYCODONE: NEGATIVE ng/mL (ref <100)
Opiates: NEGATIVE ng/mL (ref <100)
Oxidant: NEGATIVE ug/mL (ref <200)
PHENCYCLIDINE: NEGATIVE ng/mL (ref <25)
Please note:: 0
pH: 7.21 (ref 4.5–9.0)

## 2016-04-28 LAB — PRENATAL PROFILE (SOLSTAS)
ANTIBODY SCREEN: NEGATIVE
Basophils Absolute: 0 cells/uL (ref 0–200)
Basophils Relative: 0 %
EOS ABS: 276 {cells}/uL (ref 15–500)
Eosinophils Relative: 3 %
HCT: 37.2 % (ref 35.0–45.0)
HIV 1&2 Ab, 4th Generation: NONREACTIVE
Hemoglobin: 12.3 g/dL (ref 11.7–15.5)
Hepatitis B Surface Ag: NEGATIVE
LYMPHS PCT: 23 %
Lymphs Abs: 2116 cells/uL (ref 850–3900)
MCH: 31 pg (ref 27.0–33.0)
MCHC: 33.1 g/dL (ref 32.0–36.0)
MCV: 93.7 fL (ref 80.0–100.0)
MONOS PCT: 6 %
MPV: 12 fL (ref 7.5–12.5)
Monocytes Absolute: 552 cells/uL (ref 200–950)
Neutro Abs: 6256 cells/uL (ref 1500–7800)
Neutrophils Relative %: 68 %
PLATELETS: 171 10*3/uL (ref 140–400)
RBC: 3.97 MIL/uL (ref 3.80–5.10)
RDW: 12.6 % (ref 11.0–15.0)
RH TYPE: POSITIVE
RUBELLA: 8.03 {index} — AB (ref <0.90)
WBC: 9.2 10*3/uL (ref 3.8–10.8)

## 2016-04-28 LAB — GC/CHLAMYDIA PROBE AMP (~~LOC~~) NOT AT ARMC
Chlamydia: NEGATIVE
Neisseria Gonorrhea: NEGATIVE

## 2016-04-28 LAB — CULTURE, OB URINE: Organism ID, Bacteria: NO GROWTH

## 2016-04-29 LAB — HEMOGLOBINOPATHY EVALUATION
HCT: 37.2 % (ref 35.0–45.0)
HGB A2 QUANT: 2.6 % (ref 1.8–3.5)
Hemoglobin: 12.3 g/dL (ref 11.7–15.5)
Hgb A: 96.4 % (ref >96.0)
MCH: 31 pg (ref 27.0–33.0)
MCV: 93.7 fL (ref 80.0–100.0)
RBC: 3.97 MIL/uL (ref 3.80–5.10)
RDW: 12.6 % (ref 11.0–15.0)

## 2016-04-29 LAB — CYTOLOGY - PAP

## 2016-05-02 ENCOUNTER — Inpatient Hospital Stay (HOSPITAL_COMMUNITY)
Admission: AD | Admit: 2016-05-02 | Discharge: 2016-05-02 | Disposition: A | Discharge status: Home / Self Care | Payer: Medicaid Other | Source: Ambulatory Visit | Attending: Obstetrics & Gynecology | Admitting: Obstetrics & Gynecology

## 2016-05-02 ENCOUNTER — Encounter (HOSPITAL_COMMUNITY): Payer: Self-pay

## 2016-05-02 DIAGNOSIS — Z87891 Personal history of nicotine dependence: Secondary | ICD-10-CM | POA: Insufficient documentation

## 2016-05-02 DIAGNOSIS — R109 Unspecified abdominal pain: Secondary | ICD-10-CM | POA: Diagnosis not present

## 2016-05-02 DIAGNOSIS — Y92009 Unspecified place in unspecified non-institutional (private) residence as the place of occurrence of the external cause: Secondary | ICD-10-CM

## 2016-05-02 DIAGNOSIS — Y92099 Unspecified place in other non-institutional residence as the place of occurrence of the external cause: Secondary | ICD-10-CM

## 2016-05-02 DIAGNOSIS — Z3A09 9 weeks gestation of pregnancy: Secondary | ICD-10-CM | POA: Diagnosis not present

## 2016-05-02 DIAGNOSIS — O26891 Other specified pregnancy related conditions, first trimester: Secondary | ICD-10-CM

## 2016-05-02 DIAGNOSIS — Z3400 Encounter for supervision of normal first pregnancy, unspecified trimester: Secondary | ICD-10-CM

## 2016-05-02 DIAGNOSIS — W1789XA Other fall from one level to another, initial encounter: Secondary | ICD-10-CM | POA: Diagnosis not present

## 2016-05-02 DIAGNOSIS — W19XXXA Unspecified fall, initial encounter: Secondary | ICD-10-CM

## 2016-05-02 DIAGNOSIS — O9A211 Injury, poisoning and certain other consequences of external causes complicating pregnancy, first trimester: Secondary | ICD-10-CM

## 2016-05-02 DIAGNOSIS — S3991XA Unspecified injury of abdomen, initial encounter: Secondary | ICD-10-CM

## 2016-05-02 DIAGNOSIS — N898 Other specified noninflammatory disorders of vagina: Secondary | ICD-10-CM | POA: Diagnosis present

## 2016-05-02 LAB — WET PREP, GENITAL
CLUE CELLS WET PREP: NONE SEEN
SPERM: NONE SEEN
Trich, Wet Prep: NONE SEEN
Yeast Wet Prep HPF POC: NONE SEEN

## 2016-05-02 LAB — URINALYSIS, ROUTINE W REFLEX MICROSCOPIC
Bilirubin Urine: NEGATIVE
GLUCOSE, UA: NEGATIVE mg/dL
Hgb urine dipstick: NEGATIVE
KETONES UR: 5 mg/dL — AB
LEUKOCYTES UA: NEGATIVE
Nitrite: NEGATIVE
PH: 6 (ref 5.0–8.0)
Protein, ur: NEGATIVE mg/dL
SPECIFIC GRAVITY, URINE: 1.02 (ref 1.005–1.030)

## 2016-05-02 MED ORDER — OXYCODONE-ACETAMINOPHEN 5-325 MG PO TABS
1.0000 | ORAL_TABLET | Freq: Once | ORAL | Status: AC
  Administered 2016-05-02: 1 via ORAL
  Filled 2016-05-02: qty 1

## 2016-05-02 MED ORDER — OXYCODONE-ACETAMINOPHEN 5-325 MG PO TABS: 1.0000 | ORAL_TABLET | Freq: Four times a day (QID) | ORAL | 0 refills | Status: DC | PRN

## 2016-05-02 NOTE — MAU Provider Note (Signed)
History     CSN: [id number]  Arrival date and time: 05/02/16 1234   First Provider Initiated Contact with Patient 05/02/16 1306      Chief Complaint  Patient presents with  . Vaginal Discharge   HPI Paula Gamble is a 20 y.o. G3P0020 at 3219w6d who presents to MAU today with complaint of abdominal pain and vaginal discharge today. The patient states that she fell on the grass onto her backside 3 days ago. Since then she has been having 6/10 lower abdominal pain. She has tried Tylenol with little relief, last dose was yesterday. She denies vaginal bleeding, but states that at the time of the fall she noted a "water" discharge and since then has had a foul smelling white discharge. She states occasional burning with urination. She denies fever. She has had intermittent N/V without diarrhea that has been persistent for weeks. [facility]   OB History    Gravida Para Term Preterm AB Living   3       2     SAB TAB Ectopic Multiple Live Births   2              Past Medical History:  Diagnosis Date  . Pyelonephritis     Past Surgical History:  Procedure Laterality Date  . DILATION AND CURETTAGE OF UTERUS     x2    Family History  Problem Relation Age of Onset  . Diabetes Paternal Uncle   . Cancer Maternal Grandmother   . Diabetes Maternal Grandmother   . Hypertension Maternal Grandmother   . Cancer Paternal Grandmother     liver    Social History  Substance Use Topics  . Smoking status: Former Games developermoker  . Smokeless tobacco: Never Used  . Alcohol use No     Comment: Last drank in Oct/2017, per pt drank 20 beers     Allergies:  Allergies  Allergen Reactions  . Latex Itching    Prescriptions Prior to Admission  Medication Sig Dispense Refill Last Dose  . acetaminophen (TYLENOL) 500 MG tablet Take 500 mg by mouth every 6 (six) hours as needed.   Taking  . ondansetron (ZOFRAN) 4 MG tablet Take 1 tablet (4 mg total) by mouth every 8 (eight)  hours as needed for nausea, vomiting or refractory nausea / vomiting. 20 tablet 0   . Prenat w/o A Vit-FeFum-FePo-FA (CONCEPT OB) 130-92.4-1 MG CAPS Take 1 tablet by mouth daily. 30 capsule 12 Taking  . promethazine (PHENERGAN) 25 MG tablet Take 0.5-1 tablets (12.5-25 mg total) by mouth every 6 (six) hours as needed for nausea. 30 tablet 2 Taking    Review of Systems  Constitutional: Negative for fever and malaise/fatigue.  Gastrointestinal: Positive for abdominal pain and nausea. Negative for constipation, diarrhea and vomiting.  Genitourinary: Positive for dysuria. Negative for frequency and urgency.       Neg - vaginal bleeding + discharge   Physical Exam   Blood pressure 125/58, pulse 85, temperature 98.3 F (36.8 C), resp. rate 16, height 5' 3.3" (1.608 m), weight 157 lb 3.2 oz (71.3 kg), last menstrual period 02/23/2016.  Physical Exam  Nursing note and vitals reviewed. Constitutional: She is oriented to person, place, and time. She appears well-developed and well-nourished. No distress.  HENT:  Head: Normocephalic and atraumatic.  Cardiovascular: Normal rate.   Respiratory: Effort normal.  GI: Soft. She exhibits no distension and no mass. There is tenderness (mild suprapubic tenderness to palpation). There  is no rebound and no guarding.  Genitourinary: Cervix exhibits no motion tenderness. No bleeding in the vagina. Vaginal discharge (small amount of thin, white discharge) found.  Neurological: She is alert and oriented to person, place, and time.  Skin: Skin is warm and dry. No erythema.  Psychiatric: She has a normal mood and affect.  Dilation: Closed Effacement (%): Thick Cervical Position: Middle Exam by:: Magnus SinningWenzel, PA-C   Results for orders placed or performed during the hospital encounter of 05/02/16 (from the past 24 hour(s))  Urinalysis, Routine w reflex microscopic     Status: Abnormal   Collection Time: 05/02/16 12:42 PM  Result Value Ref Range   Color, Urine  YELLOW YELLOW   APPearance CLEAR CLEAR   Specific Gravity, Urine 1.020 1.005 - 1.030   pH 6.0 5.0 - 8.0   Glucose, UA NEGATIVE NEGATIVE mg/dL   Hgb urine dipstick NEGATIVE NEGATIVE   Bilirubin Urine NEGATIVE NEGATIVE   Ketones, ur 5 (A) NEGATIVE mg/dL   Protein, ur NEGATIVE NEGATIVE mg/dL   Nitrite NEGATIVE NEGATIVE   Leukocytes, UA NEGATIVE NEGATIVE  Wet prep, genital     Status: Abnormal   Collection Time: 05/02/16  1:22 PM  Result Value Ref Range   Yeast Wet Prep HPF POC NONE SEEN NONE SEEN   Trich, Wet Prep NONE SEEN NONE SEEN   Clue Cells Wet Prep HPF POC NONE SEEN NONE SEEN   WBC, Wet Prep HPF POC FEW (A) NONE SEEN   Sperm NONE SEEN     MAU Course  Procedures None  MDM FHR - 168 bpm with doppler UA, Wet prep today 1 Percocet given for pain  Assessment and Plan  A: SIUP at 7679w6d Abdominal pain in pregnancy, first trimester Fall at home, initial encounter  P: Discharge home Rx for Percocet given to patient Tylenol advised for mild pain First trimester precautions discussed Patient advised to follow-up with [facility] as scheduled for routine prenatal care Patient may return to MAU as needed or if her condition were to change or worsen  Marny LowensteinJulie N Wenzel, PA-C  05/02/2016, 2:02 PM

## 2016-05-02 NOTE — Discharge Instructions (Signed)
Round Ligament Pain Introduction The round ligament is a cord of muscle and tissue that helps to support the uterus. It can become a source of pain during pregnancy if it becomes stretched or twisted as the baby grows. The pain usually begins in the second trimester of pregnancy, and it can come and go until the baby is delivered. It is not a serious problem, and it does not cause harm to the baby. Round ligament pain is usually a short, sharp, and pinching pain, but it can also be a dull, lingering, and aching pain. The pain is felt in the lower side of the abdomen or in the groin. It usually starts deep in the groin and moves up to the outside of the hip area. Pain can occur with:  A sudden change in position.  Rolling over in bed.  Coughing or sneezing.  Physical activity. Follow these instructions at home: Watch your condition for any changes. Take these steps to help with your pain:  When the pain starts, relax. Then try:  Sitting down.  Flexing your knees up to your abdomen.  Lying on your side with one pillow under your abdomen and another pillow between your legs.  Sitting in a warm bath for 15-20 minutes or until the pain goes away.  Take over-the-counter and prescription medicines only as told by your health care provider.  Move slowly when you sit and stand.  Avoid long walks if they cause pain.  Stop or lessen your physical activities if they cause pain. Contact a health care provider if:  Your pain does not go away with treatment.  You feel pain in your back that you did not have before.  Your medicine is not helping. Get help right away if:  You develop a fever or chills.  You develop uterine contractions.  You develop vaginal bleeding.  You develop nausea or vomiting.  You develop diarrhea.  You have pain when you urinate. This information is not intended to replace advice given to you by your health care provider. Make sure you discuss any questions  you have with your health care provider. Document Released: 02/17/2008 Document Revised: 10/16/2015 Document Reviewed: 07/17/2014  2017 Elsevier  

## 2016-05-02 NOTE — MAU Note (Signed)
3 days ago she fell and has been hurting in her lower abdomen 10/10. States she was feeling baby move before and is not feeling baby move now. I explained she may not feel baby move this early. No bleeding, fells like water is came out when she fell and now is having white discharge with an odor.

## 2016-05-16 ENCOUNTER — Encounter (HOSPITAL_COMMUNITY): Payer: Self-pay

## 2016-05-16 ENCOUNTER — Inpatient Hospital Stay (HOSPITAL_COMMUNITY)
Admission: AD | Admit: 2016-05-16 | Discharge: 2016-05-17 | Disposition: A | Discharge status: Home / Self Care | Payer: Medicaid Other | Source: Ambulatory Visit | Attending: Obstetrics and Gynecology | Admitting: Obstetrics and Gynecology

## 2016-05-16 DIAGNOSIS — O26891 Other specified pregnancy related conditions, first trimester: Secondary | ICD-10-CM | POA: Diagnosis not present

## 2016-05-16 DIAGNOSIS — R102 Pelvic and perineal pain: Secondary | ICD-10-CM | POA: Insufficient documentation

## 2016-05-16 LAB — URINALYSIS, ROUTINE W REFLEX MICROSCOPIC
BILIRUBIN URINE: NEGATIVE
GLUCOSE, UA: NEGATIVE mg/dL
HGB URINE DIPSTICK: NEGATIVE
KETONES UR: 20 mg/dL — AB
Leukocytes, UA: NEGATIVE
Nitrite: NEGATIVE
PH: 6 (ref 5.0–8.0)
PROTEIN: NEGATIVE mg/dL
Specific Gravity, Urine: 1.019 (ref 1.005–1.030)

## 2016-05-16 NOTE — MAU Note (Signed)
Pt c/o lower abdominal cramping and stabbing pain and having vaginal discharge. Denies bleeding. Has HA. Started 2 hours ago. Did not take anything for pain. Rates pain 8/10. Last intercourse 2 days ago.

## 2016-05-17 DIAGNOSIS — O26891 Other specified pregnancy related conditions, first trimester: Secondary | ICD-10-CM

## 2016-05-17 DIAGNOSIS — R102 Pelvic and perineal pain: Secondary | ICD-10-CM | POA: Diagnosis not present

## 2016-05-17 LAB — WET PREP, GENITAL
Clue Cells Wet Prep HPF POC: NONE SEEN
SPERM: NONE SEEN
Trich, Wet Prep: NONE SEEN
YEAST WET PREP: NONE SEEN

## 2016-05-17 MED ORDER — OXYCODONE-ACETAMINOPHEN 5-325 MG PO TABS
1.0000 | ORAL_TABLET | ORAL | Status: DC | PRN
Start: 2016-05-17 — End: 2016-05-17
  Administered 2016-05-17: 1 via ORAL
  Filled 2016-05-17: qty 1

## 2016-05-17 NOTE — MAU Provider Note (Signed)
History    Paula LentJulissa Gamble is a 20 year old G3P0020 who presents to MAU tonight with complaints of suprapubic pain, watery discharge and headache. She was treated for pyelonephritis in July, 2017. Since then she has been seen in the MAU several times since then for pelvic pain and discharge but has not been diagnosed with UTI, PID or any genital infections.   CSN: [id number]  Arrival date and time: 05/16/16 2322   None     Chief Complaint  Patient presents with  . Abdominal Pain   Abdominal Pain  This is a new problem. The current episode started today. The onset quality is sudden. The problem occurs constantly. The problem has been unchanged. The pain is located in the suprapubic region. The pain is at a severity of 8/10. The quality of the pain is cramping. The abdominal pain does not radiate. Associated symptoms include headaches. Pertinent negatives include no anorexia, arthralgias, belching, constipation, diarrhea, dysuria, fever, flatus, frequency, hematochezia, hematuria, melena, myalgias, nausea, vomiting or weight loss. The pain is relieved by nothing. She has tried nothing for the symptoms. There is no history of abdominal surgery, colon cancer, Crohn's disease, gallstones, GERD, irritable bowel syndrome, pancreatitis, PUD or ulcerative colitis.    OB History    Gravida Para Term Preterm AB Living   3       2     SAB TAB Ectopic Multiple Live Births   2              Past Medical History:  Diagnosis Date  . Pyelonephritis     Past Surgical History:  Procedure Laterality Date  . DILATION AND CURETTAGE OF UTERUS     x2    Family History  Problem Relation Age of Onset  . Diabetes Paternal Uncle   . Cancer Maternal Grandmother   . Diabetes Maternal Grandmother   . Hypertension Maternal Grandmother   . Cancer Paternal Grandmother     liver    Social History  Substance Use Topics  . Smoking status: Former Games developermoker  . Smokeless tobacco: Never Used  . Alcohol  use No     Comment: Last drank in Oct/2017, per pt drank 20 beers     Allergies:  Allergies  Allergen Reactions  . Latex Itching    Prescriptions Prior to Admission  Medication Sig Dispense Refill Last Dose  . acetaminophen (TYLENOL) 500 MG tablet Take 500 mg by mouth every 6 (six) hours as needed.   Taking  . ondansetron (ZOFRAN) 4 MG tablet Take 1 tablet (4 mg total) by mouth every 8 (eight) hours as needed for nausea, vomiting or refractory nausea / vomiting. 20 tablet 0   . oxyCODONE-acetaminophen (PERCOCET/ROXICET) 5-325 MG tablet Take 1 tablet by mouth every 6 (six) hours as needed for severe pain. 5 tablet 0   . Prenat w/o A Vit-FeFum-FePo-FA (CONCEPT OB) 130-92.4-1 MG CAPS Take 1 tablet by mouth daily. 30 capsule 12 Taking  . promethazine (PHENERGAN) 25 MG tablet Take 0.5-1 tablets (12.5-25 mg total) by mouth every 6 (six) hours as needed for nausea. 30 tablet 2 Taking    Review of Systems  Constitutional: Negative.  Negative for chills, diaphoresis, fever, malaise/fatigue and weight loss.  HENT: Negative.   Eyes: Negative.   Respiratory: Negative.   Cardiovascular: Negative.   Gastrointestinal: Positive for abdominal pain. Negative for anorexia, constipation, diarrhea, flatus, hematochezia, melena, nausea and vomiting.  Genitourinary: Negative for dysuria, frequency and hematuria.  Musculoskeletal: Negative for arthralgias and myalgias.  Neurological: Positive for headaches. Negative for weakness.  Psychiatric/Behavioral: Negative.    Physical Exam   Blood pressure 109/58, pulse 70, temperature 97.9 F (36.6 C), temperature source Oral, resp. rate 16, last menstrual period 02/23/2016.  Physical Exam  Constitutional: She is oriented to person, place, and time. She appears well-developed and well-nourished.  HENT:  Head: Normocephalic.  Neck: Normal range of motion.  Respiratory: Effort normal. No respiratory distress. She has no wheezes. She has no rales. She exhibits  no tenderness.  GI: Soft. She exhibits no distension and no mass. There is no tenderness. There is no rebound and no guarding.  Genitourinary: Vagina normal.  Genitourinary Comments: External genitalia and vaginal walls pink with no lesions. Scant milky white odorless discharge present in the vagina, cervix is pink with no lesions. No CMT.   Musculoskeletal: Normal range of motion.  NO CVAT  Neurological: She is alert and oriented to person, place, and time.  Skin: Skin is warm and dry.  Psychiatric: She has a normal mood and affect.    MAU Course  Procedures  MDM -UA: normal -UC, GC CT cultures pending -wet prep: negative  Assessment and Plan   1. Pelvic pain affecting pregnancy in first trimester, antepartum     2. Pain is relieved with 1 tab of percocet. Patient to be discharged with instructions to keep prenatal appointment on Jan 2nd. Patient knows to return to MAU if she has incrased  3. Discussed with patient the importance of drinking water and the expected physiologic changes in pregnancy.    Charlesetta GaribaldiKathryn Lorraine Kooistra CNM 05/17/2016, 12:15 AM

## 2016-05-17 NOTE — Discharge Instructions (Signed)
Embarazo e infección urinaria °(Pregnancy and Urinary Tract Infection) °¿QUÉ ES UNA INFECCIÓN URINARIA? °Una infección urinaria (IU) puede ocurrir en cualquier lugar de las vías urinarias. Estas incluyen los riñones, los tubos que conectan los riñones con la vejiga (uréteres), la vejiga y el tubo por el que se elimina la orina del cuerpo (uretra). Estos órganos fabrican, almacenan y eliminan la orina del organismo. La IU puede ser una infección de la vejiga (cistitis) o una infección de los riñones (pielonefritis). Esta infección puede deberse a hongos, virus o bacterias. Las bacterias son las causas más comunes de las IU. °Es más probable presentar una IU durante el embarazo por estas razones: °· Los cambios físicos y hormonales por los que atraviesa el cuerpo pueden hacer que sea más fácil que las bacterias ingresen en las vías urinarias. °· El feto en desarrollo hace presión sobre el útero y puede afectar el flujo de orina. °¿LA IU PONE EN RIESGO AL BEBÉ? °Una IU no tratada durante el embarazo podría ocasionar una infección en los riñones, lo que puede causar problemas de salud que afecten al bebé. Algunas de las complicaciones posibles de una IU no tratada son las siguientes: °· Tener al bebé antes de las 37 semanas de embarazo (prematuro). °· Tener un bebé con bajo peso al nacer. °· Presentar hipertensión arterial durante el embarazo (preeclampsia). °¿CUÁLES SON LOS SÍNTOMAS DE LA IU? °Entre los síntomas de una IU, se incluyen los siguientes: °· Fiebre. °· Micción frecuente o eliminación de pequeñas cantidades de orina con frecuencia. °· Necesidad urgente de orinar. °· Sensación de ardor o dolor al orinar. °· Orina con mal olor u olor atípico. °· Orina turbia. °· Dolor en la parte baja del abdomen o en la espalda. °· Dificultad para orinar. °· Sangre en la orina. °· Vómitos o más apetito de lo normal. °· Diarrea o dolor abdominal. °· Tiene secreción de flujo vaginal. °¿CUÁLES SON LAS OPCIONES DE TRATAMIENTO  PARA LA IU DURANTE EL EMBARAZO? °El tratamiento de esta afección puede incluir lo siguiente: °· Antibióticos cuyo uso es seguro durante el embarazo. °· Otros medicamentos para tratar las causas menos frecuentes de infección urinaria. °¿CÓMO PUEDO PREVENIR UNA IU? °Para prevenir la IU, haga lo siguiente: °· Vaya al baño en cuanto sienta la necesidad de hacerlo. °· Siempre debe limpiarse desde adelante hacia atrás. °· Lávese el área genital con agua tibia y jabón todos los días. °· Vaciar la vejiga antes y después de tener relaciones sexuales. °· Use ropa interior de algodón. °· Limite el consumo de alimentos y bebidas con alto contenido de azúcar, como gaseosas comunes, jugos y dulces. °· Beba de 6 a 8 vasos de agua por día. °· No use pantalones ajustados. °· No se haga duchas vaginales ni use desodorantes en aerosol. °· No tome alcohol, cafeína ni bebidas gaseosas. Estas sustancias pueden irritar la vejiga. °¿CUÁNDO DEBO BUSCAR ATENCIÓN MÉDICA? °Solicite atención médica si: °· Los síntomas no mejoran o empeoran. °· Tiene fiebre después de dos días de tratamiento. °· Tiene una erupción cutánea. °· Tiene flujo vaginal anormal. °· Siente dolor en la espalda o en el costado. °· Tiene escalofríos. °· Tiene náuseas y vómitos. °¿CUÁNDO DEBO BUSCAR ASISTENCIA MÉDICA INMEDIATA? °Solicite atención médica de inmediato si está embarazada y le sucede lo siguiente: °· Siente contracciones en el útero. °· Siente dolor en la parte inferior del abdomen. °· Tiene una pérdida de líquido por la vagina. °· Observa sangre en la orina. °· Tiene vómitos y no puede tragar medicamentos ni agua. °Esta   información no tiene como fin reemplazar el consejo del médico. Asegúrese de hacerle al médico cualquier pregunta que tenga. °Document Released: 02/02/2012 Document Revised: 09/01/2015 Document Reviewed: 03/31/2015 °Elsevier Interactive Patient Education © 2017 Elsevier Inc. ° °

## 2016-05-18 LAB — GC/CHLAMYDIA PROBE AMP (~~LOC~~) NOT AT ARMC
CHLAMYDIA, DNA PROBE: NEGATIVE
Neisseria Gonorrhea: NEGATIVE

## 2016-05-18 LAB — CULTURE, OB URINE: CULTURE: NO GROWTH

## 2016-05-25 ENCOUNTER — Ambulatory Visit (INDEPENDENT_AMBULATORY_CARE_PROVIDER_SITE_OTHER): Payer: Medicaid Other | Admitting: Advanced Practice Midwife

## 2016-05-25 VITALS — BP 113/55 | HR 80 | Wt 159.0 lb

## 2016-05-25 DIAGNOSIS — Z3A19 19 weeks gestation of pregnancy: Secondary | ICD-10-CM

## 2016-05-25 DIAGNOSIS — Z3400 Encounter for supervision of normal first pregnancy, unspecified trimester: Secondary | ICD-10-CM

## 2016-05-25 DIAGNOSIS — Z3402 Encounter for supervision of normal first pregnancy, second trimester: Secondary | ICD-10-CM

## 2016-05-25 NOTE — Patient Instructions (Signed)
Segundo trimestre de embarazo (Second Trimester of Pregnancy) El segundo trimestre va desde la semana13 hasta la 28, desde el cuarto hasta el sexto mes, y suele ser el momento en el que mejor se siente. Su organismo se ha adaptado a estar embarazada y comienza a sentirse fsicamente mejor. En general, las nuseas matutinas han disminuido o han desaparecido completamente, puede tener ms energa y un aumento de apetito. El segundo trimestre es tambin la poca en la que el feto se desarrolla rpidamente. Hacia el final del sexto mes, el feto mide aproximadamente 9pulgadas (23cm) y pesa alrededor de 1 libras (700g). Es probable que sienta que el beb se mueve (da pataditas) entre las 18 y 20semanas del embarazo. CAMBIOS EN EL ORGANISMO Su organismo atraviesa por muchos cambios durante el embarazo, y estos varan de una mujer a otra.  Seguir aumentando de peso. Notar que la parte baja del abdomen sobresale.  Podrn aparecer las primeras estras en las caderas, el abdomen y las mamas.  Es posible que tenga dolores de cabeza que pueden aliviarse con los medicamentos que el mdico le permita tomar.  Tal vez tenga necesidad de orinar con ms frecuencia porque el feto est ejerciendo presin sobre la vejiga.  Debido al embarazo podr sentir acidez estomacal con frecuencia.  Puede estar estreida, ya que ciertas hormonas enlentecen los movimientos de los msculos que empujan los desechos a travs de los intestinos.  Pueden aparecer hemorroides o abultarse e hincharse las venas (venas varicosas).  Puede tener dolor de espalda que se debe al aumento de peso y a que las hormonas del embarazo relajan las articulaciones entre los huesos de la pelvis, y como consecuencia de la modificacin del peso y los msculos que mantienen el equilibrio.  Las mamas seguirn creciendo y le dolern.  Las encas pueden sangrar y estar sensibles al cepillado y al hilo dental.  Pueden aparecer zonas oscuras o  manchas (cloasma, mscara del embarazo) en el rostro que probablemente se atenuar despus del nacimiento del beb.  Es posible que se forme una lnea oscura desde el ombligo hasta la zona del pubis (linea nigra) que probablemente se atenuar despus del nacimiento del beb.  Tal vez haya cambios en el cabello que pueden incluir su engrosamiento, crecimiento rpido y cambios en la textura. Adems, a algunas mujeres se les cae el cabello durante o despus del embarazo, o tienen el cabello seco o fino. Lo ms probable es que el cabello se le normalice despus del nacimiento del beb. QU DEBE ESPERAR EN LAS CONSULTAS PRENATALES Durante una visita prenatal de rutina:  La pesarn para asegurarse de que usted y el feto estn creciendo normalmente.  Le tomarn la presin arterial.  Le medirn el abdomen para controlar el desarrollo del beb.  Se escucharn los latidos cardacos fetales.  Se evaluarn los resultados de los estudios solicitados en visitas anteriores. El mdico puede preguntarle lo siguiente:  Cmo se siente.  Si siente los movimientos del beb.  Si ha tenido sntomas anormales, como prdida de lquido, sangrado, dolores de cabeza intensos o clicos abdominales.  Si est consumiendo algn producto que contenga tabaco, como cigarrillos, tabaco de mascar y cigarrillos electrnicos.  Si tiene alguna pregunta. Otros estudios que podrn realizarse durante el segundo trimestre incluyen lo siguiente:  Anlisis de sangre para detectar lo siguiente: ? Concentraciones de hierro bajas (anemia). ? Diabetes gestacional (entre la semana 24 y la 28). ? Anticuerpos Rh.  Anlisis de orina para detectar infecciones, diabetes o protenas en la orina.    Una ecografa para confirmar que el beb crece y se desarrolla correctamente.  Una amniocentesis para diagnosticar posibles problemas genticos.  Estudios del feto para descartar espina bfida y sndrome de Down.  Prueba del VIH (virus  de inmunodeficiencia humana). Los exmenes prenatales de rutina incluyen la prueba de deteccin del VIH, a menos que decida no realizrsela. INSTRUCCIONES PARA EL CUIDADO EN EL HOGAR  Evite fumar, consumir hierbas, beber alcohol y tomar frmacos que no le hayan recetado. Estas sustancias qumicas afectan la formacin y el desarrollo del beb.  No consuma ningn producto que contenga tabaco, lo que incluye cigarrillos, tabaco de mascar y cigarrillos electrnicos. Si necesita ayuda para dejar de fumar, consulte al mdico. Puede recibir asesoramiento y otro tipo de recursos para dejar de fumar.  Siga las indicaciones del mdico en relacin con el uso de medicamentos. Durante el embarazo, hay medicamentos que son seguros de tomar y otros que no.  Haga ejercicio solamente como se lo haya indicado el mdico. Sentir clicos uterinos es un buen signo para detener la actividad fsica.  Contine comiendo alimentos sanos con regularidad.  Use un sostn que le brinde buen soporte si le duelen las mamas.  No se d baos de inmersin en agua caliente, baos turcos ni saunas.  Use el cinturn de seguridad en todo momento mientras conduce.  No coma carne cruda ni queso sin cocinar; evite el contacto con las bandejas sanitarias de los gatos y la tierra que estos animales usan. Estos elementos contienen grmenes que pueden causar defectos congnitos en el beb.  Tome las vitaminas prenatales.  Tome entre 1500 y 2000mg de calcio diariamente comenzando en la semana20 del embarazo hasta el parto.  Si est estreida, pruebe un laxante suave (si el mdico lo autoriza). Consuma ms alimentos ricos en fibra, como vegetales y frutas frescos y cereales integrales. Beba gran cantidad de lquido para mantener la orina de tono claro o color amarillo plido.  Dese baos de asiento con agua tibia para aliviar el dolor o las molestias causadas por las hemorroides. Use una crema para las hemorroides si el mdico la  autoriza.  Si tiene venas varicosas, use medias de descanso. Eleve los pies durante 15minutos, 3 o 4veces por da. Limite el consumo de sal en su dieta.  No levante objetos pesados, use zapatos de tacones bajos y mantenga una buena postura.  Descanse con las piernas elevadas si tiene calambres o dolor de cintura.  Visite a su dentista si an no lo ha hecho durante el embarazo. Use un cepillo de dientes blando para higienizarse los dientes y psese el hilo dental con suavidad.  Puede seguir manteniendo relaciones sexuales, a menos que el mdico le indique lo contrario.  Concurra a todas las visitas prenatales segn las indicaciones de su mdico.  SOLICITE ATENCIN MDICA SI:  Tiene mareos.  Siente clicos leves, presin en la pelvis o dolor persistente en el abdomen.  Tiene nuseas, vmitos o diarrea persistentes.  Observa una secrecin vaginal con mal olor.  Siente dolor al orinar.  SOLICITE ATENCIN MDICA DE INMEDIATO SI:  Tiene fiebre.  Tiene una prdida de lquido por la vagina.  Tiene sangrado o pequeas prdidas vaginales.  Siente dolor intenso o clicos en el abdomen.  Sube o baja de peso rpidamente.  Tiene dificultad para respirar y siente dolor de pecho.  Sbitamente se le hinchan mucho el rostro, las manos, los tobillos, los pies o las piernas.  No ha sentido los movimientos del beb durante una hora.    Siente un dolor de cabeza intenso que no se alivia con medicamentos.  Su visin se modifica.  Esta informacin no tiene como fin reemplazar el consejo del mdico. Asegrese de hacerle al mdico cualquier pregunta que tenga. Document Released: 02/17/2005 Document Revised: 05/31/2014 Document Reviewed: 07/11/2012 Elsevier Interactive Patient Education  2017 Elsevier Inc.  

## 2016-05-25 NOTE — Progress Notes (Signed)
   PRENATAL VISIT NOTE  Subjective:  Paula Gamble is a 21 y.o. G3P0020 at 286w1d being seen today for ongoing prenatal care.  She is currently monitored for the following issues for this low-risk pregnancy and has Encounter for supervision of normal pregnancy, antepartum on her problem list.  Patient reports no complaints.  Contractions: Not present. Vag. Bleeding: None.  Movement: Absent. Denies leaking of fluid.   The following portions of the patient's history were reviewed and updated as appropriate: allergies, current medications, past family history, past medical history, past social history, past surgical history and problem list. Problem list updated.  Objective:   Vitals:   05/25/16 1000  BP: (!) 113/55  Pulse: 80  Weight: 159 lb (72.1 kg)    Fetal Status: Fetal Heart Rate (bpm): 156   Movement: Absent     General:  Alert, oriented and cooperative. Patient is in no acute distress.  Skin: Skin is warm and dry. No rash noted.   Cardiovascular: Normal heart rate noted  Respiratory: Normal respiratory effort, no problems with respiration noted  Abdomen: Soft, gravid, appropriate for gestational age. Pain/Pressure: Absent     Pelvic:  Cervical exam deferred        Extremities: Normal range of motion.  Edema: None  Mental Status: Normal mood and affect. Normal behavior. Normal judgment and thought content.   Assessment and Plan:  Pregnancy: G3P0020 at 4786w1d  1. Supervision of normal first pregnancy, antepartum  - US MFM OB COMP + 14 WK; Future   Preterm labor symptoms and general obstetric precautions including but not limited to vaginal bleeding, contractions, leaking of fluid and fetal movement were reviewed in detail with the patient. Please refer to After Visit Summary for other counseling recommendations.  Return in about 4 weeks (around 06/22/2016).   Hurshel PartyLisa A Leftwich-Kirby, CNM

## 2016-06-01 ENCOUNTER — Encounter: Payer: Self-pay | Admitting: Family Medicine

## 2016-06-16 ENCOUNTER — Inpatient Hospital Stay (EMERGENCY_DEPARTMENT_HOSPITAL)
Admission: AD | Admit: 2016-06-16 | Discharge: 2016-06-17 | Disposition: A | Discharge status: Home / Self Care | Payer: Medicaid Other | Source: Ambulatory Visit | Attending: Obstetrics & Gynecology | Admitting: Obstetrics & Gynecology

## 2016-06-16 ENCOUNTER — Encounter (HOSPITAL_COMMUNITY): Payer: Self-pay | Admitting: *Deleted

## 2016-06-16 DIAGNOSIS — O98812 Other maternal infectious and parasitic diseases complicating pregnancy, second trimester: Secondary | ICD-10-CM

## 2016-06-16 DIAGNOSIS — Z87891 Personal history of nicotine dependence: Secondary | ICD-10-CM | POA: Diagnosis not present

## 2016-06-16 DIAGNOSIS — R109 Unspecified abdominal pain: Secondary | ICD-10-CM

## 2016-06-16 DIAGNOSIS — Z79899 Other long term (current) drug therapy: Secondary | ICD-10-CM | POA: Diagnosis not present

## 2016-06-16 DIAGNOSIS — N949 Unspecified condition associated with female genital organs and menstrual cycle: Secondary | ICD-10-CM

## 2016-06-16 DIAGNOSIS — O26892 Other specified pregnancy related conditions, second trimester: Secondary | ICD-10-CM | POA: Diagnosis present

## 2016-06-16 DIAGNOSIS — B373 Candidiasis of vulva and vagina: Secondary | ICD-10-CM | POA: Insufficient documentation

## 2016-06-16 DIAGNOSIS — B3731 Acute candidiasis of vulva and vagina: Secondary | ICD-10-CM

## 2016-06-16 DIAGNOSIS — Z3A16 16 weeks gestation of pregnancy: Secondary | ICD-10-CM | POA: Diagnosis not present

## 2016-06-17 ENCOUNTER — Encounter (HOSPITAL_COMMUNITY): Payer: Self-pay | Admitting: *Deleted

## 2016-06-17 ENCOUNTER — Emergency Department (HOSPITAL_BASED_OUTPATIENT_CLINIC_OR_DEPARTMENT_OTHER)
Admission: EM | Admit: 2016-06-17 | Discharge: 2016-06-17 | Disposition: A | Discharge status: Home / Self Care | Payer: Medicaid Other | Attending: Emergency Medicine | Admitting: Emergency Medicine

## 2016-06-17 ENCOUNTER — Encounter (HOSPITAL_BASED_OUTPATIENT_CLINIC_OR_DEPARTMENT_OTHER): Payer: Self-pay | Admitting: *Deleted

## 2016-06-17 DIAGNOSIS — Z3A16 16 weeks gestation of pregnancy: Secondary | ICD-10-CM

## 2016-06-17 DIAGNOSIS — O26892 Other specified pregnancy related conditions, second trimester: Secondary | ICD-10-CM | POA: Insufficient documentation

## 2016-06-17 DIAGNOSIS — Z87891 Personal history of nicotine dependence: Secondary | ICD-10-CM | POA: Insufficient documentation

## 2016-06-17 DIAGNOSIS — B373 Candidiasis of vulva and vagina: Secondary | ICD-10-CM

## 2016-06-17 DIAGNOSIS — Z79899 Other long term (current) drug therapy: Secondary | ICD-10-CM | POA: Insufficient documentation

## 2016-06-17 DIAGNOSIS — R109 Unspecified abdominal pain: Secondary | ICD-10-CM | POA: Insufficient documentation

## 2016-06-17 LAB — GC/CHLAMYDIA PROBE AMP (~~LOC~~) NOT AT ARMC
CHLAMYDIA, DNA PROBE: NEGATIVE
NEISSERIA GONORRHEA: NEGATIVE

## 2016-06-17 LAB — URINALYSIS, ROUTINE W REFLEX MICROSCOPIC
BILIRUBIN URINE: NEGATIVE
Bacteria, UA: NONE SEEN
Bilirubin Urine: NEGATIVE
GLUCOSE, UA: NEGATIVE mg/dL
GLUCOSE, UA: NEGATIVE mg/dL
HGB URINE DIPSTICK: NEGATIVE
Hgb urine dipstick: NEGATIVE
KETONES UR: NEGATIVE mg/dL
KETONES UR: NEGATIVE mg/dL
NITRITE: NEGATIVE
Nitrite: NEGATIVE
PH: 6 (ref 5.0–8.0)
PROTEIN: NEGATIVE mg/dL
Protein, ur: NEGATIVE mg/dL
Specific Gravity, Urine: 1.006 (ref 1.005–1.030)
Specific Gravity, Urine: 1.013 (ref 1.005–1.030)
pH: 7 (ref 5.0–8.0)

## 2016-06-17 LAB — WET PREP, GENITAL
CLUE CELLS WET PREP: NONE SEEN
Sperm: NONE SEEN
Trich, Wet Prep: NONE SEEN

## 2016-06-17 LAB — URINALYSIS, MICROSCOPIC (REFLEX)

## 2016-06-17 MED ORDER — TERCONAZOLE 0.4 % VA CREA: 1.0000 | TOPICAL_CREAM | Freq: Every day | VAGINAL | 0 refills | Status: DC

## 2016-06-17 NOTE — ED Notes (Signed)
In to assess the Pt. And she stated "No English".  Pt. Reports "Only Spanish"  RN will assess the Pt. With the EDP and translator.

## 2016-06-17 NOTE — Discharge Instructions (Signed)
[facility]

## 2016-06-17 NOTE — ED Triage Notes (Signed)
She is [redacted] weeks pregnant. [facility]. (Interpreter # [id number] used to translate). She is here with liquid drainage from her vagina tonight.

## 2016-06-17 NOTE — MAU Note (Addendum)
WITH INTERPRETER- ON STRATUS-   FERNANDO -[id number]-   PT SAYS PAIN STARTED  AT 4PM-     PAIN- CONSTANT.      VAG D/C  STARTED  AT 11AM- AS CONSTANT TRICKLE.    [facility]

## 2016-06-17 NOTE — Discharge Instructions (Signed)
Candidiasis vaginal en los adultos (Gastrointestinal Yeast Infection, Adult) La candidiasis vaginal es una afeccin que causa dolor, hinchazn y enrojecimiento (inflamacin) de la vagina. Tambin causa secrecin vaginal. Esta es una enfermedad frecuente. Algunas mujeres contraen esta infeccin con frecuencia. CAUSAS La causa de la infeccin es un cambio en el equilibrio normal de los hongos (cndida) y las bacterias que viven en la vagina. Esta alteracin deriva en el crecimiento excesivo de los hongos, lo que causa la inflamacin. FACTORES DE RIESGO Es ms probable que esta afeccin se manifieste en:  Las mujeres que toman antibiticos.  Las mujeres que tienen diabetes.  Las mujeres que toman anticonceptivos.  Las mujeres que estn embarazadas.  Las mujeres que se hacen duchas vaginales con frecuencia.  Las mujeres que tienen un sistema de defensa (inmunitario) dbil.  Las mujeres que han tomado corticoides durante mucho tiempo.  Las mujeres que usan ropa ajustada con frecuencia. SNTOMAS Los sntomas de esta afeccin incluyen lo siguiente:  Secrecin vaginal blanca y espesa.  Hinchazn, picazn, enrojecimiento e irritacin de la vagina. Los labios de la vagina (vulva) tambin se pueden infectar.  Dolor o ardor al orinar.  Dolor durante las relaciones sexuales. DIAGNSTICO Esta afeccin se diagnostica mediante la historia clnica y un examen fsico. Este incluye un examen plvico. El mdico examinar una muestra de la secrecin vaginal con un microscopio. Probablemente el mdico enve esta muestra al laboratorio para analizarla y confirmar el diagnstico. TRATAMIENTO Esta afeccin se trata con medicamentos. Los medicamentos pueden ser recetados o de venta libre. Podrn indicarle que use uno o ms de lo siguiente:  Medicamentos por va oral.  Medicamentos que se aplican como una crema.  Medicamentos que se colocan directamente en la vagina (vulos vaginales). INSTRUCCIONES  PARA EL CUIDADO EN EL HOGAR  Tome o aplquese los medicamentos de venta libre y recetados solamente como se lo haya indicado el mdico.  No tenga relaciones sexuales hasta que el mdico lo autorice. Comunique a su compaero sexual que tiene una infeccin por hongos. Esas personas deben consultar al mdico si tienen sntomas.  No use ropa ajustada, como pantis o pantalones ajustados.  Evite el uso de tampones hasta que el mdico lo autorice.  Consuma ms yogur. Esto puede ayudar a evitar la recurrencia de la candidiasis.  Intente darse un bao de asiento para aliviar las molestias. Se trata de un bao de agua tibia que se toma mientras se est sentado. El agua solo debe llegar hasta las caderas y cubrir las nalgas. Hgalo 3o 4veces al da o como se lo haya indicado el mdico.  No se haga duchas vaginales.  Use ropa interior transpirable de algodn.  Si tiene diabetes, mantenga bajo control los niveles de azcar en la sangre. SOLICITE ATENCIN MDICA SI:  Tiene fiebre.  Los sntomas desaparecen y luego reaparecen.  Los sntomas no mejoran con el tratamiento.  Los sntomas empeoran.  Aparecen nuevos sntomas.  Aparecen ampollas alrededor o adentro de la vagina.  Le sale sangre de la vagina y no est menstruando.  Siente dolor en el abdomen. Esta informacin no tiene como fin reemplazar el consejo del mdico. Asegrese de hacerle al mdico cualquier pregunta que tenga. Document Released: 02/17/2005 Document Revised: 09/01/2015 Document Reviewed: 11/11/2014 Elsevier Interactive Patient Education  2017 Elsevier Inc.   

## 2016-06-17 NOTE — ED Provider Notes (Signed)
[facility] Provider Note   CSN: [id number] Arrival date & time: 06/17/16  2052   By signing my name below, I, Clarisse GougeXavier Herndon, attest that this documentation has been prepared under the direction and in the presence of Ponciano OrtKaren Leslie, PA-C. Electronically Signed: Clarisse GougeXavier Herndon, Scribe. 06/17/16. 11:43 PM.   History   Chief Complaint Chief Complaint  Patient presents with  . Abdominal Pain  . Pregnancy   The history is provided by the patient and medical records. A language interpreter was used.   HPI Comments: Paula Gamble is a [age] y.o. female who presents to the Emergency Department complaining of abdominal pain x 2 day. Pt [redacted] weeks pregnant and [facility] for the same, they did a vaginal exam without a speculum and told her they did not find anything abnormal. [facility] because she is concerned that there may be something abnormal happening with her pregnancy or she may be going into delivery. Pt reports associated transparent liquid and watery vaginal drainage x 2 days, chills and vaginal bleeding during this pregnancy but none with her current symptoms. Pt denies fever. G3/P0/A3.  Past Medical History:  Diagnosis Date  . Pyelonephritis     Patient Active Problem List   Diagnosis Date Noted  . Encounter for supervision of normal pregnancy, antepartum 04/16/2016    Past Surgical History:  Procedure Laterality Date  . DILATION AND CURETTAGE OF UTERUS     x2    OB History    Gravida Para Term Preterm AB Living   3       2     SAB TAB Ectopic Multiple Live Births   2               Home Medications    Prior to Admission medications   Medication Sig Start Date End Date Taking? Authorizing Provider  acetaminophen (TYLENOL) 500 MG tablet Take 500 mg by mouth every 6 (six) hours as needed.    Historical Provider, MD  ondansetron (ZOFRAN) 4 MG tablet Take 1 tablet (4 mg total) by mouth every 8 (eight) hours as needed  for nausea, vomiting or refractory nausea / vomiting. 04/27/16   Lorne SkeensNicholas Michael Schenk, MD  oxyCODONE-acetaminophen (PERCOCET/ROXICET) 5-325 MG tablet Take 1 tablet by mouth every 6 (six) hours as needed for severe pain. 05/02/16   Marny LowensteinJulie N Wenzel, PA-C  Prenat w/o A Vit-FeFum-FePo-FA (CONCEPT OB) 130-92.4-1 MG CAPS Take 1 tablet by mouth daily. 04/01/16   Dorathy KinsmanVirginia Smith, CNM  promethazine (PHENERGAN) 25 MG tablet Take 0.5-1 tablets (12.5-25 mg total) by mouth every 6 (six) hours as needed for nausea. 04/21/16   Lisa A Leftwich-Kirby, CNM  terconazole (TERAZOL 7) 0.4 % vaginal cream Place 1 applicator vaginally at bedtime. For 7 nights 06/17/16   Armando ReichertHeather D Hogan, CNM    Family History Family History  Problem Relation Age of Onset  . Diabetes Paternal Uncle   . Cancer Maternal Grandmother   . Diabetes Maternal Grandmother   . Hypertension Maternal Grandmother   . Cancer Paternal Grandmother     liver    Social History Social History  Substance Use Topics  . Smoking status: Former Games developermoker  . Smokeless tobacco: Never Used  . Alcohol use No     Comment: Last drank in Oct/2017, per pt drank 20 beers      Allergies   Latex   Review of Systems Review of Systems  All other systems reviewed and are negative.  A complete 10 system review of systems was obtained and all systems are negative except as noted in the HPI and PMH.     Physical Exam Updated Vital Signs BP 97/58   Pulse 87   Temp 98 F (36.7 C) (Oral)   Resp 20   Ht 5\' 3"  (1.6 m)   Wt 155 lb (70.3 kg)   LMP 02/23/2016   SpO2 100%   BMI 27.46 kg/m   Physical Exam  Constitutional: She is oriented to person, place, and time. Vital signs are normal. She appears well-developed and well-nourished.  Non-toxic appearance. No distress.  Afebrile, nontoxic, NAD  HENT:  Head: Normocephalic and atraumatic.  Mouth/Throat: Mucous membranes are normal.  Eyes: Conjunctivae and EOM are normal. Right eye exhibits no discharge. Left  eye exhibits no discharge.  Neck: Normal range of motion. Neck supple.  Cardiovascular: Normal rate and intact distal pulses.   Pulmonary/Chest: Effort normal. No respiratory distress.  Abdominal: Normal appearance. She exhibits no distension.  Genitourinary: Vagina normal and uterus normal. No vaginal discharge found.  Genitourinary Comments: Cervical os closed. No vaginal fluid. Informal US shows IUP with fetal HR in the ~150's. Cervix closed.  Musculoskeletal: Normal range of motion.  Neurological: She is alert and oriented to person, place, and time. She has normal strength. No sensory deficit.  Skin: Skin is warm, dry and intact. No rash noted.  Psychiatric: Her behavior is normal. Her mood appears anxious.  Nursing note and vitals reviewed.    ED Treatments / Results  DIAGNOSTIC STUDIES: Oxygen Saturation is 100% on RA, normal by my interpretation.    COORDINATION OF CARE: 11:43 PM Discussed treatment plan with pt at bedside and pt agreed to plan. Pelvic exam and US done during evaluation. Pt advised to go to [facility] for [facility] care in the future. Pt is discharged to F/U with her OB/GYN.  Labs (all labs ordered are listed, but only abnormal results are displayed) Labs Reviewed  URINALYSIS, ROUTINE W REFLEX MICROSCOPIC - Abnormal; Notable for the following:       Result Value   Leukocytes, UA TRACE (*)    All other components within normal limits  URINALYSIS, MICROSCOPIC (REFLEX) - Abnormal; Notable for the following:    Bacteria, UA RARE (*)    Squamous Epithelial / LPF 0-5 (*)    All other components within normal limits    EKG  EKG Interpretation None       Radiology No results found.  Procedures Procedures (including critical care time)  Medications Ordered in ED Medications - No data to display   Initial Impression / Assessment and Plan / ED Course  I have reviewed the triage vital signs and the nursing  notes.  Pertinent labs & imaging results that were available during my care of the patient were reviewed by me and considered in my medical decision making (see chart for details).     An After Visit Summary was printed and given to the patient.  Final Clinical Impressions(s) / ED Diagnoses   Final diagnoses:  [redacted] weeks gestation of pregnancy    New Prescriptions New Prescriptions   No medications on file  Pt advised to go to [facility] hospital if any further issues.  An After Visit Summary was printed and given to the patient.  I personally performed the services in this documentation, which was scribed in my presence.  The recorded information has been reviewed and considered.   Barnet Pall.  Lonia Skinner [place], PA-C 06/18/16 0003    Paula Libra, MD 06/18/16 [phone]

## 2016-06-17 NOTE — ED Triage Notes (Signed)
While attempting to check the patient in using the interpreter the patient states that she has, fluid coming from her bottom and she thinks her water broke, she is [redacted] weeks pregnant

## 2016-06-17 NOTE — MAU Provider Note (Signed)
History     CSN: [id number]  Arrival date and time: 06/16/16 2329   First Provider Initiated Contact with Patient 06/17/16 0013      Chief Complaint  Patient presents with  . Vaginal Discharge   Video interpreter used  Paula Gamble is 21 y.o. G3P0020 at [redacted]w[redacted]d who presents today with dischagre and cramping. She states that around 1100 yesterday she noticed increased discharge, and then around 1800 she started having some cramping.    Vaginal Discharge  The patient's primary symptoms include vaginal discharge. This is a new problem. The current episode started today (around 1100 am). The problem occurs constantly. The problem has been unchanged. Pain severity now: 6/10. The problem affects both sides. She is pregnant. Pertinent negatives include no chills, dysuria, fever, nausea, urgency or vomiting. The vaginal discharge was copious and watery. There has been no bleeding. Nothing aggravates the symptoms. She has tried nothing for the symptoms.    Past Medical History:  Diagnosis Date  . Pyelonephritis     Past Surgical History:  Procedure Laterality Date  . DILATION AND CURETTAGE OF UTERUS     x2    Family History  Problem Relation Age of Onset  . Diabetes Paternal Uncle   . Cancer Maternal Grandmother   . Diabetes Maternal Grandmother   . Hypertension Maternal Grandmother   . Cancer Paternal Grandmother     liver    Social History  Substance Use Topics  . Smoking status: Former Games developer  . Smokeless tobacco: Never Used  . Alcohol use No     Comment: Last drank in Oct/2017, per pt drank 20 beers     Allergies:  Allergies  Allergen Reactions  . Latex Itching    Prescriptions Prior to Admission  Medication Sig Dispense Refill Last Dose  . oxyCODONE-acetaminophen (PERCOCET/ROXICET) 5-325 MG tablet Take 1 tablet by mouth every 6 (six) hours as needed for severe pain. 5 tablet 0 Past Month at Unknown time  . Prenat w/o A Vit-FeFum-FePo-FA (CONCEPT OB)  130-92.4-1 MG CAPS Take 1 tablet by mouth daily. 30 capsule 12 06/16/2016 at Unknown time  . acetaminophen (TYLENOL) 500 MG tablet Take 500 mg by mouth every 6 (six) hours as needed.   More than a month at Unknown time  . ondansetron (ZOFRAN) 4 MG tablet Take 1 tablet (4 mg total) by mouth every 8 (eight) hours as needed for nausea, vomiting or refractory nausea / vomiting. 20 tablet 0   . promethazine (PHENERGAN) 25 MG tablet Take 0.5-1 tablets (12.5-25 mg total) by mouth every 6 (six) hours as needed for nausea. 30 tablet 2 More than a month at Unknown time    Review of Systems  Constitutional: Negative for chills and fever.  Gastrointestinal: Negative for nausea and vomiting.  Genitourinary: Positive for vaginal discharge. Negative for dysuria, urgency and vaginal bleeding.   Physical Exam   Blood pressure 117/74, pulse 79, temperature 98.1 F (36.7 C), temperature source Oral, resp. rate 19, height 5\' 3"  (1.6 m), weight 155 lb (70.3 kg), last menstrual period 02/23/2016, SpO2 100 %.  Physical Exam  Nursing note and vitals reviewed. Constitutional: She is oriented to person, place, and time. She appears well-developed and well-nourished. No distress.  HENT:  Head: Normocephalic.  Cardiovascular: Normal rate.   Respiratory: Effort normal.  GI: Soft. There is no tenderness. There is no rebound.  Genitourinary:  Genitourinary Comments: External: no lesion Vagina: small amount of thick, white adherent  ischarge Cervix: pink, smooth, no CMT Uterus:  AGA, FHT 154 with doppler   Neurological: She is alert and oriented to person, place, and time.  Skin: Skin is warm and dry.  Psychiatric: She has a normal mood and affect.   Results for orders placed or performed during the hospital encounter of 06/16/16 (from the past 24 hour(s))  Urinalysis, Routine w reflex microscopic     Status: Abnormal   Collection Time: 06/16/16 11:46 PM  Result Value Ref Range   Color, Urine STRAW (A) YELLOW    APPearance CLEAR CLEAR   Specific Gravity, Urine 1.013 1.005 - 1.030   pH 6.0 5.0 - 8.0   Glucose, UA NEGATIVE NEGATIVE mg/dL   Hgb urine dipstick NEGATIVE NEGATIVE   Bilirubin Urine NEGATIVE NEGATIVE   Ketones, ur NEGATIVE NEGATIVE mg/dL   Protein, ur NEGATIVE NEGATIVE mg/dL   Nitrite NEGATIVE NEGATIVE   Leukocytes, UA TRACE (A) NEGATIVE   RBC / HPF 0-5 0 - 5 RBC/hpf   WBC, UA 0-5 0 - 5 WBC/hpf   Bacteria, UA NONE SEEN NONE SEEN   Squamous Epithelial / LPF 0-5 (A) NONE SEEN   Mucous PRESENT   Wet prep, genital     Status: Abnormal   Collection Time: 06/17/16 12:30 AM  Result Value Ref Range   Yeast Wet Prep HPF POC PRESENT (A) NONE SEEN   Trich, Wet Prep NONE SEEN NONE SEEN   Clue Cells Wet Prep HPF POC NONE SEEN NONE SEEN   WBC, Wet Prep HPF POC MODERATE (A) NONE SEEN   Sperm NONE SEEN     MAU Course  Procedures  MDM   Assessment and Plan   1. Yeast infection involving the vagina and surrounding area   2. [redacted] weeks gestation of pregnancy   3. Round ligament pain    DC home Comfort measures reviewed  2nd Trimester precautions  RX: terazol 7 as directed  Return to MAU as needed FU with OB as planned  Follow-up Information    [facility].   Specialty:  Obstetrics and Gynecology Contact information: [address] [place] [id number] [phone]          Tawnya CrookHogan, Margot Oriordan Donovan 06/17/2016, 12:35 AM

## 2016-06-22 ENCOUNTER — Ambulatory Visit (INDEPENDENT_AMBULATORY_CARE_PROVIDER_SITE_OTHER): Payer: Medicaid Other | Admitting: Certified Nurse Midwife

## 2016-06-22 ENCOUNTER — Ambulatory Visit (INDEPENDENT_AMBULATORY_CARE_PROVIDER_SITE_OTHER): Payer: Medicaid Other | Admitting: Clinical

## 2016-06-22 VITALS — BP 90/57 | HR 87 | Wt 154.3 lb

## 2016-06-22 DIAGNOSIS — F4323 Adjustment disorder with mixed anxiety and depressed mood: Secondary | ICD-10-CM | POA: Diagnosis not present

## 2016-06-22 DIAGNOSIS — Z3480 Encounter for supervision of other normal pregnancy, unspecified trimester: Secondary | ICD-10-CM

## 2016-06-22 DIAGNOSIS — Z3482 Encounter for supervision of other normal pregnancy, second trimester: Secondary | ICD-10-CM

## 2016-06-22 NOTE — Progress Notes (Signed)
Subjective:  Darryl LentJulissa Lebron-Ubiera is a 21 y.o. G3P0020 at 1859w1d being seen today for ongoing prenatal care.  She is currently monitored for the following issues for this low-risk pregnancy and has Encounter for supervision of normal pregnancy, antepartum on her problem list.  Patient reports no complaints.  Contractions: Not present. Vag. Bleeding: None.  Movement: Absent. Denies leaking of fluid.   The following portions of the patient's history were reviewed and updated as appropriate: allergies, current medications, past family history, past medical history, past social history, past surgical history and problem list. Problem list updated.  Objective:   Vitals:   06/22/16 1009  BP: (!) 90/57  Pulse: 87  Weight: 154 lb 4.8 oz (70 kg)    Fetal Status: Fetal Heart Rate (bpm): 152 Fundal Height: 17 cm Movement: Absent     General:  Alert, oriented and cooperative. Patient is in no acute distress.  Skin: Skin is warm and dry. No rash noted.   Cardiovascular: Normal heart rate noted  Respiratory: Normal respiratory effort, no problems with respiration noted  Abdomen: Soft, gravid, appropriate for gestational age. Pain/Pressure: Absent     Pelvic: Vag. Bleeding: None     Cervical exam deferred        Extremities: Normal range of motion.  Edema: None  Mental Status: Normal mood and affect. Normal behavior. Normal judgment and thought content.   Urinalysis:      Assessment and Plan:  Pregnancy: G3P0020 at 5759w1d  1. Supervision of other normal pregnancy, antepartum - Anatomy US scheduled  Preterm labor symptoms and general obstetric precautions including but not limited to vaginal bleeding, contractions, leaking of fluid and fetal movement were reviewed in detail with the patient. Please refer to After Visit Summary for other counseling recommendations.  Return in about 4 weeks (around 07/20/2016).   Donette LarryMelanie Ladasha Schnackenberg, CNM

## 2016-06-22 NOTE — BH Specialist Note (Signed)
Session Start time: 10:30   End Time: 11:00 Total Time:  30 minutes Type of Service: Behavioral Health - Individual/Family Interpreter: Yes.     Interpreter Name & Language: Spanish, Kevin/Estefan/Priscilla # [facility]-June [facility]: [facility]st  SUBJECTIVE: Paula LentJulissa Gamble is a 21 y.o. female  Pt. was referred by Donette LarryMelanie Bhambri, CNM for:  anxiety and depression. Pt. reports the following symptoms/concerns: Pt states that her primary concern is that she is more tired than usual, and overeats; copes by listening to music, and used to enjoy dancing. Duration of problem:  Over one month Severity: moderate Previous treatment: none  OBJECTIVE: Mood: Appropriate & Affect: Appropriate Risk of harm to self or others: No known risk of harm to self or others Assessments administered: PHQ9: 14/ GAD7: 7  LIFE CONTEXT:  Family & Social: Lives with mother, step-father, has supportive friends and family  School/ Work: Unemployed(used to work) Self-Care: Advertising copywriterversleeps, Baristaovereats, little physical activity Life changes: Current pregnancy What is important to pt/family (values): Healthy pregnancy  GOALS ADDRESSED:  -Reduce symptoms of anxiety and depression  INTERVENTIONS: Motivational Interviewing   ASSESSMENT:  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  Pt may benefit from psychoeducation and brief therapeutic intervention regarding coping with symptoms of anxiety and depression.   PLAN: 1. F/U with behavioral health clinician: Three months, or as needed 2. Behavioral Health meds: none 3. Behavioral recommendations:  -Continue listening to favorite music daily -Consider dancing daily -Read educational material regarding coping with symptoms of anxiety and depression -Consider [facility], as needed, for ongoing therapy 4. Referral: Brief Counseling/Psychotherapy and Psychoeducation 5. From scale of 1-10, how  likely are you to follow plan: 8  Woc-Behavioral Health Clinician  Behavioral Health Clinician  Marlon PelWarmhandoff:   Warm Hand Off Completed.        Depression screen [facility] 06/22/2016 04/27/2016  Decreased Interest 1 3  Down, Depressed, Hopeless 2 1  PHQ - 2 Score 3 4  Altered sleeping 1 3  Tired, decreased energy 1 2  Change in appetite 3 0  Feeling bad or failure about yourself  3 1  Trouble concentrating 0 0  Moving slowly or fidgety/restless 1 1  Suicidal thoughts 2 0  PHQ-9 Score 14 11   GAD 7 : Generalized Anxiety Score 06/22/2016 04/27/2016  Nervous, Anxious, on Edge 1 0  Control/stop worrying 0 0  Worry too much - different things 1 2  Trouble relaxing 1 1  Restless 0 0  Easily annoyed or irritable 3 3  Afraid - awful might happen 1 3  Total GAD 7 Score 7 9

## 2016-07-06 ENCOUNTER — Ambulatory Visit (HOSPITAL_COMMUNITY)
Admission: RE | Admit: 2016-07-06 | Discharge: 2016-07-06 | Disposition: A | Discharge status: Home / Self Care | Payer: Medicaid Other | Source: Ambulatory Visit | Attending: Advanced Practice Midwife | Admitting: Advanced Practice Midwife

## 2016-07-06 ENCOUNTER — Other Ambulatory Visit: Payer: Self-pay | Admitting: Advanced Practice Midwife

## 2016-07-06 DIAGNOSIS — Z3A19 19 weeks gestation of pregnancy: Secondary | ICD-10-CM | POA: Insufficient documentation

## 2016-07-06 DIAGNOSIS — Z3689 Encounter for other specified antenatal screening: Secondary | ICD-10-CM

## 2016-07-06 DIAGNOSIS — Z3400 Encounter for supervision of normal first pregnancy, unspecified trimester: Secondary | ICD-10-CM

## 2016-07-15 ENCOUNTER — Inpatient Hospital Stay (HOSPITAL_COMMUNITY)
Admission: AD | Admit: 2016-07-15 | Discharge: 2016-07-16 | Disposition: A | Discharge status: Home / Self Care | Payer: Medicaid Other | Source: Ambulatory Visit | Attending: Obstetrics and Gynecology | Admitting: Obstetrics and Gynecology

## 2016-07-15 ENCOUNTER — Encounter (HOSPITAL_COMMUNITY): Payer: Self-pay | Admitting: *Deleted

## 2016-07-15 DIAGNOSIS — R102 Pelvic and perineal pain: Secondary | ICD-10-CM | POA: Insufficient documentation

## 2016-07-15 DIAGNOSIS — O26899 Other specified pregnancy related conditions, unspecified trimester: Secondary | ICD-10-CM | POA: Diagnosis not present

## 2016-07-15 DIAGNOSIS — Z87891 Personal history of nicotine dependence: Secondary | ICD-10-CM | POA: Insufficient documentation

## 2016-07-15 DIAGNOSIS — O26892 Other specified pregnancy related conditions, second trimester: Secondary | ICD-10-CM | POA: Diagnosis not present

## 2016-07-15 DIAGNOSIS — Z3A20 20 weeks gestation of pregnancy: Secondary | ICD-10-CM | POA: Diagnosis not present

## 2016-07-15 DIAGNOSIS — Z3480 Encounter for supervision of other normal pregnancy, unspecified trimester: Secondary | ICD-10-CM

## 2016-07-15 DIAGNOSIS — W19XXXA Unspecified fall, initial encounter: Secondary | ICD-10-CM

## 2016-07-15 NOTE — MAU Note (Addendum)
PT  SAYS   WITH  [facility]  -  LOWER  ABD  PAIN- STARTED   AT 1030. --   WHILE  LAYING IN BED- SUDDEN.   [facility]    LAST SEX-  YESTERDAY.     TOOK TYLENOL    XS 1 TAB    AT  2300,

## 2016-07-15 NOTE — MAU Note (Addendum)
Spanish interpreter # [id number]. Pt. here with complaint of lower abd. pain.  Pain started around 2200 tonight.  Pt. "fall" around 2130; landed on buttock. Pt. Denies vaginal bleeding, denies sudden gush of fluid. Doppler 145 bpm.

## 2016-07-16 ENCOUNTER — Inpatient Hospital Stay (HOSPITAL_COMMUNITY): Payer: Medicaid Other

## 2016-07-16 DIAGNOSIS — O26899 Other specified pregnancy related conditions, unspecified trimester: Secondary | ICD-10-CM | POA: Diagnosis not present

## 2016-07-16 DIAGNOSIS — R102 Pelvic and perineal pain: Secondary | ICD-10-CM | POA: Diagnosis not present

## 2016-07-16 LAB — URINALYSIS, ROUTINE W REFLEX MICROSCOPIC
BILIRUBIN URINE: NEGATIVE
Glucose, UA: NEGATIVE mg/dL
HGB URINE DIPSTICK: NEGATIVE
KETONES UR: NEGATIVE mg/dL
Nitrite: NEGATIVE
Protein, ur: NEGATIVE mg/dL
Specific Gravity, Urine: 1.025 (ref 1.005–1.030)
pH: 6 (ref 5.0–8.0)

## 2016-07-16 LAB — WET PREP, GENITAL
SPERM: NONE SEEN
TRICH WET PREP: NONE SEEN
YEAST WET PREP: NONE SEEN

## 2016-07-16 MED ORDER — HYDROCODONE-ACETAMINOPHEN 5-325 MG PO TABS: 2.0000 | ORAL_TABLET | Freq: Once | ORAL | Status: DC

## 2016-07-16 MED ORDER — HYDROCODONE-ACETAMINOPHEN 5-325 MG PO TABS
1.0000 | ORAL_TABLET | Freq: Once | ORAL | Status: AC
  Administered 2016-07-16: 1 via ORAL
  Filled 2016-07-16: qty 1

## 2016-07-16 NOTE — Discharge Instructions (Signed)
Prenatal Care Providers °[facility]    [facility] ° Phone- [phone]     Phone: [phone] °         °[facility]                      [facility] ° @[place]     Phone: [phone] ° Phone: [phone] °        [facility] °[facility]     Phone: [phone] ° Phone: [phone]   °        [facility] °[facility] [place]                hone: [phone] ° Phone: [phone] °        [facility] °Dr. Bernard Marshall      Phone: [phone] ° Phone: [phone] °        [facility] °[facility]                Phone: [phone] ° Women’s Health  ° Phone:[phone]    [facility] °         Phone: [phone] °[facility] °  Phone: [phone] °Round Ligament Pain during Pregnancy °Many women will experience a type of pain referred to as “round ligament pain” during their °pregnancy. This is associated with abdominal pain or discomfort. Since any type of abdominal °pain during pregnancy can be disconcerting, it is important to talk about round ligament pain to °relieve any anxiety or fears you may have regarding the symptoms you are feeling. Round °ligament pain is due to normal changes that take place in the body during pregnancy. It is caused °by stretching of the round ligaments attached to the uterus. More commonly it occurs on the right °side of the pelvis. °Round Ligament: An Overview °Typically in the non-pregnant state the uterus is about the size of an apple or pear. There are °thick ligaments which hold the uterus in place in the abdomen, referred to as round ligaments. °During pregnancy, your uterus will expand in size and weight, and the ligaments supporting it will °have to stretch, becoming longer and thinner. As these ligaments pull and tug they may irritate °nearby nerve fibers, which causes pain. The severity of the  pain in some cases can seem °extreme. °Some common symptoms of round ligament pain include: °• Ligament spasms or contractions/cramps that trigger a sharp pain typically on the right °side of the abdomen. °• Pain upon waking or suddenly rolling over in your sleep. °• Pain in the abdomen that is sharp brought on by exercise or other vigorous activity. °Similar Problems °Round ligament pain is often mistaken for other medical conditions because the symptoms are °similar. Acute abdominal pain during pregnancy may also be a sign of other conditions including: °• Abdominal cramps - Some abdominal pain is simply caused by change in bowel habits °associated with pregnancy. Gas is a common problem that can cause sharp, shooting °pain. °You should always seek out medical care if your pain is accompanied by fever, chills, pain upon °urination or if you have difficulty walking. Further exams and tests will be conducted to ensure °that you do not have a more serious condition. It is not uncommon for women with lower °abdominal pain to have a urinary tract infection, thus you may also be asked for a urine sample. °  Treatment °If all other conditions are ruled out you can treat your round ligament pain relatively easily. You °may be advised to take some acetaminophen (Tylenol) to reduce the severity of any persistent °pain and asked to reduce your activity level. You can apply a heating pad to the area of pain or °take a warm bath. Lying on the opposite side of the pain may help as well. °Most women will find relief from round ligament pain simply by altering their daily routines slightly. °The good news is round ligament pain will disappear completely once you have given birth to °your child! ° °

## 2016-07-16 NOTE — MAU Provider Note (Signed)
History     CSN: [id number]  Arrival date and time: 07/15/16 2311   First Provider Initiated Contact with Patient 07/15/16 2352      Chief Complaint  Patient presents with  . Pelvic Pain   Paula Gamble is a 21 y.o. G3P0020 at 6513w4d who presents today with 9/10 abdominal pain. She states that she slipped and fell around 2130, and she landed on her bottom. The pain started after this fall. She denies any VB or LOF. She has had some vaginal discharge.   Video interpreter used. # [id number]   Pelvic Pain  The patient's primary symptoms include pelvic pain and vaginal discharge. This is a new problem. The current episode started today. The problem occurs constantly. The problem has been unchanged. Pain severity now: 9/10  The problem affects both sides. She is pregnant. Associated symptoms include abdominal pain. Pertinent negatives include no chills, dysuria, fever, frequency, nausea, urgency or vomiting. The vaginal discharge was white. There has been no bleeding. Nothing aggravates the symptoms. She has tried nothing for the symptoms. Menstrual history: LMP 02/23/16    Past Medical History:  Diagnosis Date  . Pyelonephritis     Past Surgical History:  Procedure Laterality Date  . DILATION AND CURETTAGE OF UTERUS     x2    Family History  Problem Relation Age of Onset  . Diabetes Paternal Uncle   . Cancer Maternal Grandmother   . Diabetes Maternal Grandmother   . Hypertension Maternal Grandmother   . Cancer Paternal Grandmother     liver    Social History  Substance Use Topics  . Smoking status: Former Games developermoker  . Smokeless tobacco: Never Used  . Alcohol use No     Comment: Last drank in Oct/2017, per pt drank 20 beers     Allergies:  Allergies  Allergen Reactions  . Latex Itching    Prescriptions Prior to Admission  Medication Sig Dispense Refill Last Dose  . Prenat w/o A Vit-FeFum-FePo-FA (CONCEPT OB) 130-92.4-1 MG CAPS Take 1 tablet by mouth daily. 30  capsule 12 07/14/2016 at Unknown time    Review of Systems  Constitutional: Negative for chills and fever.  Gastrointestinal: Positive for abdominal pain. Negative for nausea and vomiting.  Genitourinary: Positive for pelvic pain and vaginal discharge. Negative for dysuria, frequency, urgency and vaginal bleeding.   Physical Exam   Blood pressure 111/61, pulse 91, temperature 98.6 F (37 C), temperature source Oral, resp. rate 20, weight 157 lb (71.2 kg), last menstrual period 02/23/2016, SpO2 100 %.  Physical Exam  Nursing note and vitals reviewed. Constitutional: She is oriented to person, place, and time. She appears well-developed and well-nourished. She appears distressed (appears uncomfortable ).  HENT:  Head: Normocephalic.  Cardiovascular: Normal rate.   Respiratory: Effort normal.  GI: Soft. There is no tenderness. There is no rebound.  Genitourinary:  Genitourinary Comments:  External: no lesion Vagina: small amount of white discharge Cervix: pink, smooth, no CMT, closed/thick  Uterus: AGA, FHT 145 with doppler    Neurological: She is alert and oriented to person, place, and time.  Skin: Skin is warm and dry.  Psychiatric: She has a normal mood and affect.   Results for orders placed or performed during the hospital encounter of 07/15/16 (from the past 24 hour(s))  Urinalysis, Routine w reflex microscopic     Status: Abnormal   Collection Time: 07/15/16 11:25 PM  Result Value Ref Range   Color, Urine YELLOW YELLOW   APPearance HAZY (A)  CLEAR   Specific Gravity, Urine 1.025 1.005 - 1.030   pH 6.0 5.0 - 8.0   Glucose, UA NEGATIVE NEGATIVE mg/dL   Hgb urine dipstick NEGATIVE NEGATIVE   Bilirubin Urine NEGATIVE NEGATIVE   Ketones, ur NEGATIVE NEGATIVE mg/dL   Protein, ur NEGATIVE NEGATIVE mg/dL   Nitrite NEGATIVE NEGATIVE   Leukocytes, UA MODERATE (A) NEGATIVE   RBC / HPF 0-5 0 - 5 RBC/hpf   WBC, UA 6-30 0 - 5 WBC/hpf   Bacteria, UA RARE (A) NONE SEEN   Squamous  Epithelial / LPF 6-30 (A) NONE SEEN   Mucous PRESENT   Wet prep, genital     Status: Abnormal   Collection Time: 07/15/16 11:50 PM  Result Value Ref Range   Yeast Wet Prep HPF POC NONE SEEN NONE SEEN   Trich, Wet Prep NONE SEEN NONE SEEN   Clue Cells Wet Prep HPF POC PRESENT (A) NONE SEEN   WBC, Wet Prep HPF POC MODERATE (A) NONE SEEN   Sperm NONE SEEN    [place]: Breech, no previa, cervical length 3.4cm, amniotic fluid subjectively normal.  MAU Course  Procedures  MDM   Assessment and Plan   1. Pain of round ligament affecting pregnancy, antepartum   2. Supervision of other normal pregnancy, antepartum   3. [redacted] weeks gestation of pregnancy   4. Fall, initial encounter    DC home Comfort measures reviewed  2nd/3rd Trimester precautions  PTL precautions  Fetal kick counts RX: none  Return to MAU as needed FU with OB as planned  Follow-up Information    [facility] Follow up.   Contact information: [address] [place] [address] [phone]           Tawnya Crook 07/16/2016, 12:04 AM

## 2016-07-16 NOTE — MAU Note (Signed)
Pt. To radiology for ultrasound.

## 2016-07-19 LAB — GC/CHLAMYDIA PROBE AMP (~~LOC~~) NOT AT ARMC
Chlamydia: NEGATIVE
Neisseria Gonorrhea: NEGATIVE

## 2016-07-21 ENCOUNTER — Ambulatory Visit (INDEPENDENT_AMBULATORY_CARE_PROVIDER_SITE_OTHER): Payer: Medicaid Other | Admitting: Obstetrics and Gynecology

## 2016-07-21 VITALS — BP 93/54 | HR 86 | Wt 157.7 lb

## 2016-07-21 DIAGNOSIS — Z3482 Encounter for supervision of other normal pregnancy, second trimester: Secondary | ICD-10-CM

## 2016-07-21 DIAGNOSIS — Z3480 Encounter for supervision of other normal pregnancy, unspecified trimester: Secondary | ICD-10-CM

## 2016-07-21 LAB — GLUCOSE, CAPILLARY: GLUCOSE-CAPILLARY: 68 mg/dL (ref 65–99)

## 2016-07-21 NOTE — Progress Notes (Signed)
   PRENATAL VISIT NOTE  Subjective:  Paula Gamble is a 21 y.o. G3P0020 at 3463w2d being seen today for ongoing prenatal care.  She is currently monitored for the following issues for this low-risk pregnancy and has Encounter for supervision of normal pregnancy, antepartum on her problem list.  Patient reports Dizziness. She ate breakfast at 0700. She has not had anything to eat in 4 hours. She ate cookies, tomatoe and Juice at 0700. Marland Kitchen.  Contractions: Not present.  .  Movement: Present. Denies leaking of fluid.   The following portions of the patient's history were reviewed and updated as appropriate: allergies, current medications, past family history, past medical history, past social history, past surgical history and problem list. Problem list updated.  Objective:   Vitals:   07/21/16 1048  BP: (!) 93/54  Pulse: 86  Weight: 157 lb 11.2 oz (71.5 kg)    Fetal Status: Fetal Heart Rate (bpm): 135   Movement: Present     General:  Alert, oriented and cooperative. Patient is in no acute distress.  Skin: Skin is warm and dry. No rash noted.   Cardiovascular: Normal heart rate noted  Respiratory: Normal respiratory effort, no problems with respiration noted  Abdomen: Soft, gravid, appropriate for gestational age. Pain/Pressure: Absent     Pelvic:  Cervical exam deferred        Extremities: Normal range of motion.  Edema: None  Mental Status: Normal mood and affect. Normal behavior. Normal judgment and thought content.   Assessment and Plan:  Pregnancy: G3P0020 at 6063w2d  1. Supervision of other normal pregnancy, antepartum  - US MFM OB FOLLOW UP; Future 3/13 @ 0815.  - Dizziness likely 2/2 poor diet. Discussed adding protein in the morning. Bs checked in the WOC which showed 68, peanut butter and crackers given to the patient. Patient feeling much better after eating.  - Discussed genetic screening options, patient would like to have second trimester genetic screening. Quad  screen ordered.   Preterm labor symptoms and general obstetric precautions including but not limited to vaginal bleeding, contractions, leaking of fluid and fetal movement were reviewed in detail with the patient. Please refer to After Visit Summary for other counseling recommendations.  Return in about 4 weeks (around 08/18/2016).   Duane LopeJennifer I Jerrion Tabbert, NP

## 2016-07-21 NOTE — Patient Instructions (Signed)
Elevated AFP    Maternal serum alpha-fetoprotein (AFP) screening uses a small amount of a woman's blood to identify pregnancies at an increased risk for open neural tube defects. This screening is performed between 15 weeks and 20 weeks of pregnancy. It detects greater than 80 percent of cases of open neural tube defects such as Spina Bifida and anencephaly.\cb2 Alpha-fetoprotein (AFP) is produced only by the fetus and is present in the fetal spinal cord. The amount of AFP can be measured in a pregnant woman's blood.   If there is more than the usual amount of AFP, an opening in the spinal column (neural tube defect) or other birth defect (e.g. abdominal wall defect) may be present. Further testing, using detailed ultrasound is suggested to determine the nature of a possible birth defect in the fetus. A detailed ultrasoundbetween 18 weeks a 20 weeks of pregnancy can detect 80 to 90 percent of babies with neural tube defects.   Abdominal wall defects result from the infant's intestine or other abdominal organs herniating out of the navel (omphalocele) or into the amniotic cavity (gastroschisis). An opening in the abdominal wall can often be surgically repaired at birth with a good outcome. An ultrasound does not rule out all birth defects.may also be considered for increased detection of open neural tube defects. In addition to detecting chromosome abnormalities, amniocentesis can measure the AFP level in the amniotic fluid. Amniocentesis detects greater than 98 percent of fetuses with open neural tube defects.can be used to rule out multiple-gestation pregnancy and incorrect gestational dates as causes of an elevated AFP. Also, an elevated AFP may not be the result of any problem.   Some healthy fetuses produce more AFP than average; this is often the cause for false positive AFP tests.are other possible explanations for an elevated MSAFP level with a normal sonogram (no neural tube defect present). Some  of these include:  Intrauterine growth restriction (IUGR) Preterm labor Oligohydramnios (low fluid) Multiple gestations (such as twins, triplets) Vanishing twin Placental abnormalities Abnormalities of the fetal kidneys or urinary tract A pregnancy that is further along than believed at the time of the blood test.  Unexplained elevations of AFP are considered a risk factor for pre-eclampsia, IUGR, preterm delivery and placental abruption, and fetal loss.  Counseling is available for anyone who has an abnormal AFP result, or who has questions about the test or their results.

## 2016-07-29 LAB — AFP, QUAD SCREEN
DIA Value (EIA): 209.19 pg/mL
DSR (BY AGE) 1 IN: 1137
Gestational Age: 22.3 WEEKS
MATERNAL AGE AT EDD: 21.6 a
MSAFP Mom: 1.58
MSAFP: 118.3 ng/mL
MSHCG: 29008 m[IU]/mL
Osb Risk: 2212
T18 (By Age): 1:4429 {titer}
UE3 VALUE: 3.64 ng/mL
Weight: 157 [lb_av]

## 2016-08-03 ENCOUNTER — Ambulatory Visit (HOSPITAL_COMMUNITY)
Admission: RE | Admit: 2016-08-03 | Discharge: 2016-08-03 | Disposition: A | Discharge status: Home / Self Care | Payer: Medicaid Other | Source: Ambulatory Visit | Attending: Obstetrics and Gynecology | Admitting: Obstetrics and Gynecology

## 2016-08-03 ENCOUNTER — Other Ambulatory Visit: Payer: Self-pay | Admitting: Obstetrics and Gynecology

## 2016-08-03 DIAGNOSIS — Z3689 Encounter for other specified antenatal screening: Secondary | ICD-10-CM

## 2016-08-03 DIAGNOSIS — Z3A23 23 weeks gestation of pregnancy: Secondary | ICD-10-CM | POA: Diagnosis not present

## 2016-08-03 DIAGNOSIS — Z362 Encounter for other antenatal screening follow-up: Secondary | ICD-10-CM | POA: Insufficient documentation

## 2016-08-03 DIAGNOSIS — Z3480 Encounter for supervision of other normal pregnancy, unspecified trimester: Secondary | ICD-10-CM

## 2016-08-04 ENCOUNTER — Inpatient Hospital Stay (HOSPITAL_COMMUNITY)
Admission: AD | Admit: 2016-08-04 | Discharge: 2016-08-04 | Disposition: A | Discharge status: Home / Self Care | Payer: Medicaid Other | Source: Ambulatory Visit | Attending: Obstetrics and Gynecology | Admitting: Obstetrics and Gynecology

## 2016-08-04 ENCOUNTER — Encounter (HOSPITAL_COMMUNITY): Payer: Self-pay | Admitting: *Deleted

## 2016-08-04 DIAGNOSIS — K625 Hemorrhage of anus and rectum: Secondary | ICD-10-CM | POA: Diagnosis not present

## 2016-08-04 DIAGNOSIS — K644 Residual hemorrhoidal skin tags: Secondary | ICD-10-CM

## 2016-08-04 DIAGNOSIS — O99612 Diseases of the digestive system complicating pregnancy, second trimester: Secondary | ICD-10-CM | POA: Insufficient documentation

## 2016-08-04 DIAGNOSIS — O26892 Other specified pregnancy related conditions, second trimester: Secondary | ICD-10-CM | POA: Diagnosis not present

## 2016-08-04 DIAGNOSIS — Z3A23 23 weeks gestation of pregnancy: Secondary | ICD-10-CM | POA: Insufficient documentation

## 2016-08-04 DIAGNOSIS — Z87891 Personal history of nicotine dependence: Secondary | ICD-10-CM | POA: Diagnosis not present

## 2016-08-04 DIAGNOSIS — R109 Unspecified abdominal pain: Secondary | ICD-10-CM | POA: Insufficient documentation

## 2016-08-04 DIAGNOSIS — K5900 Constipation, unspecified: Secondary | ICD-10-CM

## 2016-08-04 DIAGNOSIS — Z3480 Encounter for supervision of other normal pregnancy, unspecified trimester: Secondary | ICD-10-CM

## 2016-08-04 LAB — URINALYSIS, ROUTINE W REFLEX MICROSCOPIC
Bilirubin Urine: NEGATIVE
GLUCOSE, UA: NEGATIVE mg/dL
Hgb urine dipstick: NEGATIVE
Ketones, ur: NEGATIVE mg/dL
NITRITE: NEGATIVE
PROTEIN: NEGATIVE mg/dL
SPECIFIC GRAVITY, URINE: 1.016 (ref 1.005–1.030)
pH: 7 (ref 5.0–8.0)

## 2016-08-04 MED ORDER — POLYETHYLENE GLYCOL 3350 17 G PO PACK: 17.0000 g | PACK | Freq: Every day | ORAL | 0 refills | Status: DC

## 2016-08-04 MED ORDER — BISACODYL 5 MG PO TBEC: 5.0000 mg | DELAYED_RELEASE_TABLET | Freq: Every day | ORAL | 0 refills | Status: DC | PRN

## 2016-08-04 NOTE — MAU Provider Note (Signed)
History   CSN: [id number]  Arrival date and time: 08/04/16 1048   First Provider Initiated Contact with Patient 08/04/16 1140     Chief Complaint  Patient presents with  . Constipation  . Abdominal Pain   Video interpreter used.  HPI  Paula Gamble is here today accompanied by her mother.  She is a 21 yo G3P0020 at 2274w2d who presents with abdominal pain and bleeding from unknown source. This is not a new problem. She has had this pain for the past 3 months, with episodes where it gets worse. This morning she was straining to have a BM and she noticed blood in the toilet.  She was unsure if this was rectal or vaginal bleeding. Her lower abdominal pains come and go, feel like cramping, but never completely go away. She tried taking exlax (sennosides) last night for the first time, otherwise has not taken anything for constipation, despite being constipated for a few months.    She is feeling baby move.  She has not had loss of fluid. Denies fevers, nausea, dysuria.  OB History    Gravida Para Term Preterm AB Living   3       2     SAB TAB Ectopic Multiple Live Births   2              Past Medical History:  Diagnosis Date  . Pyelonephritis     Past Surgical History:  Procedure Laterality Date  . DILATION AND CURETTAGE OF UTERUS     x2    Family History  Problem Relation Age of Onset  . Diabetes Paternal Uncle   . Cancer Maternal Grandmother   . Diabetes Maternal Grandmother   . Hypertension Maternal Grandmother   . Cancer Paternal Grandmother     liver    Social History  Substance Use Topics  . Smoking status: Former Games developermoker  . Smokeless tobacco: Never Used  . Alcohol use No     Comment: Last drank in Oct/2017, per pt drank 20 beers     Allergies:  Allergies  Allergen Reactions  . Latex Itching    Prescriptions Prior to Admission  Medication Sig Dispense Refill Last Dose  . Prenat w/o A Vit-FeFum-FePo-FA (CONCEPT OB) 130-92.4-1 MG CAPS Take 1 tablet by mouth  daily. 30 capsule 12 08/03/2016 at Unknown time  . [DISCONTINUED] bisacodyl (DULCOLAX) 5 MG EC tablet Take 5 mg by mouth daily as needed for mild constipation or moderate constipation.   Not taking    Review of Systems  Constitutional: Negative for fever.  Gastrointestinal: Positive for abdominal pain, anal bleeding and constipation. Negative for nausea, rectal pain and vomiting.  Genitourinary: Negative for dysuria and vaginal bleeding.   Physical Exam   Blood pressure 120/67, pulse 91, temperature 97.9 F (36.6 C), temperature source Oral, resp. rate 18, last menstrual period 02/23/2016.  Physical Exam  Constitutional: She is oriented to person, place, and time. She appears well-developed and well-nourished.  Uncomfortable appearing  HENT:  Head: Normocephalic.  Right Ear: External ear normal.  Left Ear: External ear normal.  Mouth/Throat: Oropharynx is clear and moist.  Eyes: Conjunctivae and EOM are normal.  Neck: Normal range of motion.  GI: Soft.  nontender uterus, stomach mildly tender to palpation, no guarding or rebound  Genitourinary:  Genitourinary Comments: No blood in vaginal vault, SVE closed/thick/high.  Rectal exam with small external hemorrhoid, mildly tender to palpation.  No overt bleeding.  Musculoskeletal: She exhibits no edema.  Neurological: She is  alert and oriented to person, place, and time.   MAU Course  Procedures  MDM Reviewed chart - [place] 2/23 without previa, cervical length 3.4 cm No contractions on toco, no blood in vaginal vault, closed cervix and non-tender uterus r/o placental abruption or preterm labor. Rectal exam with small external hemorrhoid which explains rectal bleeding, esp in combination with untreated constipation.  FHT:  Category I Assessment and Plan  21 yo G3P0020 at [redacted]w[redacted]d presents with abdominal pain and likely rectal bleeding in the setting of chronic constipation.   -return precautions given -start dulcolax, miralax,  increase water intake & activity (walking) -follow up with [facility] for routine prenatal care.  Charlsie Merles 08/04/2016, 12:50 PM   CNM attestation:  I have seen and examined this patient; I agree with above documentation in the residen's note.   Paula Gamble is a 21 y.o. G3P0020 at [redacted]w[redacted]d reporting rectal bleeding +FM, denies LOF, VB, contractions, vaginal discharge.  PE: BP 113/57 (BP Location: Right Arm)   Pulse 92   Temp 97.9 F (36.6 C) (Oral)   Resp 16   LMP 02/23/2016  Gen: calm comfortable, NAD Resp: normal effort, no distress Abd: gravid  ROS, labs, PMH reviewed NST reactive   Plan: - fetal kick counts reinforced, preterm labor precautions - miralax for constipation  - continue routine follow up in OB clinic  Tawnya Crook, CNM 1:47 PM

## 2016-08-04 NOTE — MAU Note (Addendum)
Pt was having small BM this morning & started bleeding.  Has been constipated for the last 2 months.  C/O contractions for the last hour, unsure if she is having vaginal or rectal bleeding, denies LOF.  Pt took a laxative last night for the first time.

## 2016-08-15 ENCOUNTER — Encounter (HOSPITAL_COMMUNITY): Payer: Self-pay | Admitting: *Deleted

## 2016-08-15 ENCOUNTER — Inpatient Hospital Stay (HOSPITAL_COMMUNITY)
Admission: AD | Admit: 2016-08-15 | Discharge: 2016-08-15 | Disposition: A | Discharge status: Home / Self Care | Payer: Medicaid Other | Source: Ambulatory Visit | Attending: Obstetrics & Gynecology | Admitting: Obstetrics & Gynecology

## 2016-08-15 DIAGNOSIS — Z87891 Personal history of nicotine dependence: Secondary | ICD-10-CM | POA: Insufficient documentation

## 2016-08-15 DIAGNOSIS — O98812 Other maternal infectious and parasitic diseases complicating pregnancy, second trimester: Secondary | ICD-10-CM | POA: Diagnosis not present

## 2016-08-15 DIAGNOSIS — B373 Candidiasis of vulva and vagina: Secondary | ICD-10-CM | POA: Diagnosis not present

## 2016-08-15 DIAGNOSIS — O26892 Other specified pregnancy related conditions, second trimester: Secondary | ICD-10-CM

## 2016-08-15 DIAGNOSIS — Z3A24 24 weeks gestation of pregnancy: Secondary | ICD-10-CM | POA: Diagnosis not present

## 2016-08-15 DIAGNOSIS — B379 Candidiasis, unspecified: Secondary | ICD-10-CM | POA: Diagnosis not present

## 2016-08-15 DIAGNOSIS — R109 Unspecified abdominal pain: Secondary | ICD-10-CM

## 2016-08-15 LAB — WET PREP, GENITAL
SPERM: NONE SEEN
Trich, Wet Prep: NONE SEEN

## 2016-08-15 LAB — URINALYSIS, ROUTINE W REFLEX MICROSCOPIC
BILIRUBIN URINE: NEGATIVE
Glucose, UA: NEGATIVE mg/dL
HGB URINE DIPSTICK: NEGATIVE
KETONES UR: NEGATIVE mg/dL
Leukocytes, UA: NEGATIVE
Nitrite: NEGATIVE
Protein, ur: NEGATIVE mg/dL
SPECIFIC GRAVITY, URINE: 1.011 (ref 1.005–1.030)
pH: 8 (ref 5.0–8.0)

## 2016-08-15 LAB — AMNISURE RUPTURE OF MEMBRANE (ROM) NOT AT ARMC: Amnisure ROM: NEGATIVE

## 2016-08-15 MED ORDER — TERCONAZOLE 0.4 % VA CREA: 1.0000 | TOPICAL_CREAM | Freq: Every day | VAGINAL | 0 refills | Status: DC

## 2016-08-15 MED ORDER — POLYETHYLENE GLYCOL 3350 17 G PO PACK: 17.0000 g | PACK | Freq: Every day | ORAL | 0 refills | Status: DC | PRN

## 2016-08-15 NOTE — MAU Provider Note (Signed)
History     CSN: [id number]  Arrival date and time: 08/15/16 1533   First Provider Initiated Contact with Patient 08/15/16 1550      No chief complaint on file.  HPI Paula Gamble 21 y.o. 6169w6d  Comes to MAU with pain.  Speaks Spanish and interpreter in the room.  Is visibly uincomfortable and brought into MAU in wheelchair.  Became increasingly uncomfortable and stated she needed to push.  Patient is thinking the baby is coming out.  Speculum exam done.   OB History    Gravida Para Term Preterm AB Living   3       2     SAB TAB Ectopic Multiple Live Births   2              Past Medical History:  Diagnosis Date  . Pyelonephritis     Past Surgical History:  Procedure Laterality Date  . DILATION AND CURETTAGE OF UTERUS     x2    Family History  Problem Relation Age of Onset  . Diabetes Paternal Uncle   . Cancer Maternal Grandmother   . Diabetes Maternal Grandmother   . Hypertension Maternal Grandmother   . Cancer Paternal Grandmother     liver    Social History  Substance Use Topics  . Smoking status: Former Games developermoker  . Smokeless tobacco: Never Used  . Alcohol use No     Comment: Last drank in Oct/2017, per pt drank 20 beers     Allergies:  Allergies  Allergen Reactions  . Latex Itching    Prescriptions Prior to Admission  Medication Sig Dispense Refill Last Dose  . bisacodyl (DULCOLAX) 5 MG EC tablet Take 1 tablet (5 mg total) by mouth daily as needed for mild constipation or moderate constipation. 30 tablet 0   . polyethylene glycol (MIRALAX / GLYCOLAX) packet Take 17 g by mouth daily. 14 each 0   . Prenat w/o A Vit-FeFum-FePo-FA (CONCEPT OB) 130-92.4-1 MG CAPS Take 1 tablet by mouth daily. 30 capsule 12 08/03/2016 at Unknown time    Review of Systems  Constitutional: Negative for fever.  Gastrointestinal: Positive for abdominal pain.  Genitourinary: Negative for vaginal bleeding and vaginal discharge.   Physical Exam   Blood pressure  126/68, pulse 98, temperature 98 F (36.7 C), resp. rate 16, weight 157 lb (71.2 kg), last menstrual period 02/23/2016.  Physical Exam  Nursing note and vitals reviewed. Constitutional: She is oriented to person, place, and time. She appears well-developed and well-nourished.  HENT:  Head: Normocephalic.  Eyes: EOM are normal.  Neck: Neck supple.  GI: Soft.  Having lower abdominal pain - hard to palpate but uterus seemingly is tight when she is having the most pain.  Genitourinary:  Genitourinary Comments: Speculum exam: Vagina - Severe erythema, Mod amount of curdy adherent yellow discharge, no odor Cervix - No contact bleeding, no leaking, visually closed Bimanual exam: Cervix closed, no stool palpated in lower rectum Wet prep, amnisure done, FFN collected but will not send as cervix is closed. Chaperone and interpreter present for exam.   Musculoskeletal: Normal range of motion.  Neurological: She is alert and oriented to person, place, and time.  Skin: Skin is warm and dry.  Psychiatric: She has a normal mood and affect.    MAU Course  Procedures Results for orders placed or performed during the hospital encounter of 08/15/16 (from the past 24 hour(s))  Urinalysis, Routine w reflex microscopic     Status: None  Collection Time: 08/15/16  3:40 PM  Result Value Ref Range   Color, Urine YELLOW YELLOW   APPearance CLEAR CLEAR   Specific Gravity, Urine 1.011 1.005 - 1.030   pH 8.0 5.0 - 8.0   Glucose, UA NEGATIVE NEGATIVE mg/dL   Hgb urine dipstick NEGATIVE NEGATIVE   Bilirubin Urine NEGATIVE NEGATIVE   Ketones, ur NEGATIVE NEGATIVE mg/dL   Protein, ur NEGATIVE NEGATIVE mg/dL   Nitrite NEGATIVE NEGATIVE   Leukocytes, UA NEGATIVE NEGATIVE  Wet prep, genital     Status: Abnormal   Collection Time: 08/15/16  3:56 PM  Result Value Ref Range   Yeast Wet Prep HPF POC PRESENT (A) NONE SEEN   Trich, Wet Prep NONE SEEN NONE SEEN   Clue Cells Wet Prep HPF POC PRESENT (A) NONE  SEEN   WBC, Wet Prep HPF POC TOO NUMEROUS TO COUNT (A) NONE SEEN   Sperm NONE SEEN   Amnisure rupture of membrane (rom)not at [facility]     Status: None   Collection Time: 08/15/16  3:56 PM  Result Value Ref Range   Amnisure ROM NEGATIVE     MDM Has gotten one bag of fluids and is not having pain at this time. Fundus palpated and is soft.  FHT has moderate varibility at 140 baseline.  Encouraged to drink lots of fluids and drink 2 glasses if she starts to have pain again.  Last BM was yesterday and was soft.  States she is not having any problems with constipation like she was at her last visit.  Assessment and Plan  Abdominal pain in pregnancy - unknown cause Vaginal yeast infection  Plan Keep your [facility] appointment on Wednesday. Get your medication  - Terazol at the pharmacy and use at bedtime for the yeast infection. Drink 2 glasses of fluids if you begin having pain again. Return if you are having increasing abdominal pain.    Dominyk Law L Shawnya Mayor 08/15/2016, 4:04 PM

## 2016-08-15 NOTE — Discharge Instructions (Signed)
Keep your  appointment on Wednesday. Get your medication  - Terazol at the pharmacy and use at bedtime for the yeast infection. Drink 2 glasses of fluids if you begin having pain again. Return if you are having increasing abdominal pain.

## 2016-08-15 NOTE — MAU Note (Signed)
Pt presents to MAU with complaints of pelvic pressure, lower abdominal cramping with leakage of fluid today. Denies any vaginal bleeding

## 2016-08-17 ENCOUNTER — Inpatient Hospital Stay (HOSPITAL_COMMUNITY)
Admission: AD | Admit: 2016-08-17 | Discharge: 2016-08-17 | Disposition: A | Discharge status: Home / Self Care | Payer: Medicaid Other | Source: Ambulatory Visit | Attending: Obstetrics and Gynecology | Admitting: Obstetrics and Gynecology

## 2016-08-17 DIAGNOSIS — R109 Unspecified abdominal pain: Secondary | ICD-10-CM | POA: Diagnosis not present

## 2016-08-17 DIAGNOSIS — O26892 Other specified pregnancy related conditions, second trimester: Secondary | ICD-10-CM | POA: Insufficient documentation

## 2016-08-17 DIAGNOSIS — K5900 Constipation, unspecified: Secondary | ICD-10-CM | POA: Diagnosis not present

## 2016-08-17 DIAGNOSIS — Z3A25 25 weeks gestation of pregnancy: Secondary | ICD-10-CM

## 2016-08-17 LAB — CBC
HCT: 33.9 % — ABNORMAL LOW (ref 36.0–46.0)
HEMOGLOBIN: 11.2 g/dL — AB (ref 12.0–15.0)
MCH: 31.7 pg (ref 26.0–34.0)
MCHC: 33 g/dL (ref 30.0–36.0)
MCV: 96 fL (ref 78.0–100.0)
Platelets: 183 10*3/uL (ref 150–400)
RBC: 3.53 MIL/uL — ABNORMAL LOW (ref 3.87–5.11)
RDW: 13.5 % (ref 11.5–15.5)
WBC: 14.8 10*3/uL — ABNORMAL HIGH (ref 4.0–10.5)

## 2016-08-17 LAB — COMPREHENSIVE METABOLIC PANEL
ALBUMIN: 3.6 g/dL (ref 3.5–5.0)
ALK PHOS: 74 U/L (ref 38–126)
ALT: 18 U/L (ref 14–54)
ANION GAP: 8 (ref 5–15)
AST: 20 U/L (ref 15–41)
BUN: 11 mg/dL (ref 6–20)
CALCIUM: 9.3 mg/dL (ref 8.9–10.3)
CHLORIDE: 107 mmol/L (ref 101–111)
CO2: 22 mmol/L (ref 22–32)
Creatinine, Ser: 0.41 mg/dL — ABNORMAL LOW (ref 0.44–1.00)
GFR calc Af Amer: >60 mL/min (ref >60)
GFR calc non Af Amer: >60 mL/min (ref >60)
GLUCOSE: 89 mg/dL (ref 65–99)
POTASSIUM: 3.7 mmol/L (ref 3.5–5.1)
Sodium: 137 mmol/L (ref 135–145)
Total Bilirubin: 0.4 mg/dL (ref 0.3–1.2)
Total Protein: 7.2 g/dL (ref 6.5–8.1)

## 2016-08-17 LAB — URINALYSIS, ROUTINE W REFLEX MICROSCOPIC
BILIRUBIN URINE: NEGATIVE
GLUCOSE, UA: NEGATIVE mg/dL
HGB URINE DIPSTICK: NEGATIVE
KETONES UR: NEGATIVE mg/dL
NITRITE: NEGATIVE
PROTEIN: NEGATIVE mg/dL
Specific Gravity, Urine: 1.013 (ref 1.005–1.030)
pH: 7 (ref 5.0–8.0)

## 2016-08-17 LAB — WET PREP, GENITAL
Clue Cells Wet Prep HPF POC: NONE SEEN
SPERM: NONE SEEN
TRICH WET PREP: NONE SEEN

## 2016-08-17 LAB — AMYLASE: Amylase: 75 U/L (ref 28–100)

## 2016-08-17 LAB — FETAL FIBRONECTIN: FETAL FIBRONECTIN: NEGATIVE

## 2016-08-17 LAB — LIPASE, BLOOD: Lipase: 14 U/L (ref 11–51)

## 2016-08-17 MED ORDER — COMFORT FIT MATERNITY SUPP MED MISC: 1.0000 | Freq: Every day | 0 refills | Status: DC | PRN

## 2016-08-17 NOTE — Discharge Instructions (Signed)
Tercer trimestre de embarazo (Third Trimester of Pregnancy) El tercer trimestre comprende desde la semana29 hasta la semana42, es decir, desde el mes7 hasta el mes9. El tercer trimestre es un perodo en el que el feto crece rpidamente. Hacia el final del noveno mes, el feto mide alrededor de 20pulgadas (45cm) de largo y pesa entre 6 y 10 libras (2,700 y 4,500kg). CAMBIOS EN EL ORGANISMO Su organismo atraviesa por muchos cambios durante el embarazo, y estos varan de una mujer a otra.  Seguir aumentando de peso. Es de esperar que aumente entre 25 y 35libras (11 y 16kg) hacia el final del embarazo.  Podrn aparecer las primeras estras en las caderas, el abdomen y las mamas.  Puede tener necesidad de orinar con ms frecuencia porque el feto baja hacia la pelvis y ejerce presin sobre la vejiga.  Debido al embarazo podr sentir acidez estomacal con frecuencia.  Puede estar estreida, ya que ciertas hormonas enlentecen los movimientos de los msculos que empujan los desechos a travs de los intestinos.  Pueden aparecer hemorroides o abultarse e hincharse las venas (venas varicosas).  Puede sentir dolor plvico debido al aumento de peso y a que las hormonas del embarazo relajan las articulaciones entre los huesos de la pelvis. El dolor de espalda puede ser consecuencia de la sobrecarga de los msculos que soportan la postura.  Tal vez haya cambios en el cabello que pueden incluir su engrosamiento, crecimiento rpido y cambios en la textura. Adems, a algunas mujeres se les cae el cabello durante o despus del embarazo, o tienen el cabello seco o fino. Lo ms probable es que el cabello se le normalice despus del nacimiento del beb.  Las mamas seguirn creciendo y le dolern. A veces, puede haber una secrecin amarilla de las mamas llamada calostro.  El ombligo puede salir hacia afuera.  Puede sentir que le falta el aire debido a que se expande el tero.  Puede notar que el feto  "baja" o lo siente ms bajo, en el abdomen.  Puede tener una prdida de secrecin mucosa con sangre. Esto suele ocurrir en el trmino de unos pocos das a una semana antes de que comience el trabajo de parto.  El cuello del tero se vuelve delgado y blando (se borra) cerca de la fecha de parto. QU DEBE ESPERAR EN LOS EXMENES PRENATALES Le harn exmenes prenatales cada 2semanas hasta la semana36. A partir de ese momento le harn exmenes semanales. Durante una visita prenatal de rutina:  La pesarn para asegurarse de que usted y el feto estn creciendo normalmente.  Le tomarn la presin arterial.  Le medirn el abdomen para controlar el desarrollo del beb.  Se escucharn los latidos cardacos fetales.  Se evaluarn los resultados de los estudios solicitados en visitas anteriores.  Le revisarn el cuello del tero cuando est prxima la fecha de parto para controlar si este se ha borrado. Alrededor de la semana36, el mdico le revisar el cuello del tero. Al mismo tiempo, realizar un anlisis de las secreciones del tejido vaginal. Este examen es para determinar si hay un tipo de bacteria, estreptococo Grupo B. El mdico le explicar esto con ms detalle. El mdico puede preguntarle lo siguiente:  Cmo le gustara que fuera el parto.  Cmo se siente.  Si siente los movimientos del beb.  Si ha tenido sntomas anormales, como prdida de lquido, sangrado, dolores de cabeza intensos o clicos abdominales.  Si est consumiendo algn producto que contenga tabaco, como cigarrillos, tabaco de mascar y   cigarrillos electrnicos.  Si tiene alguna pregunta. Otros exmenes o estudios de deteccin que pueden realizarse durante el tercer trimestre incluyen lo siguiente:  Anlisis de sangre para controlar los niveles de hierro (anemia).  Controles fetales para determinar su salud, nivel de actividad y crecimiento. Si tiene alguna enfermedad o hay problemas durante el embarazo, le harn  estudios.  Prueba del VIH (virus de inmunodeficiencia humana). Si corre un riesgo alto, pueden realizarle una prueba de deteccin del VIH durante el tercer trimestre del embarazo. FALSO TRABAJO DE PARTO Es posible que sienta contracciones leves e irregulares que finalmente desaparecen. Se llaman contracciones de Braxton Hicks o falso trabajo de parto. Las contracciones pueden durar horas, das o incluso semanas, antes de que el verdadero trabajo de parto se inicie. Si las contracciones ocurren a intervalos regulares, se intensifican o se hacen dolorosas, lo mejor es que la revise el mdico. SIGNOS DE TRABAJO DE PARTO  Clicos de tipo menstrual.  Contracciones cada 5minutos o menos.  Contracciones que comienzan en la parte superior del tero y se extienden hacia abajo, a la zona inferior del abdomen y la espalda.  Sensacin de mayor presin en la pelvis o dolor de espalda.  Una secrecin de mucosidad acuosa o con sangre que sale de la vagina. Si tiene alguno de estos signos antes de la semana37 del embarazo, llame a su mdico de inmediato. Debe concurrir al hospital para que la controlen inmediatamente. INSTRUCCIONES PARA EL CUIDADO EN EL HOGAR  Evite fumar, consumir hierbas, beber alcohol y tomar frmacos que no le hayan recetado. Estas sustancias qumicas afectan la formacin y el desarrollo del beb.  No consuma ningn producto que contenga tabaco, lo que incluye cigarrillos, tabaco de mascar y cigarrillos electrnicos. Si necesita ayuda para dejar de fumar, consulte al mdico. Puede recibir asesoramiento y otro tipo de recursos para dejar de fumar.  Siga las indicaciones del mdico en relacin con el uso de medicamentos. Durante el embarazo, hay medicamentos que son seguros de tomar y otros que no.  Haga ejercicio solamente como se lo haya indicado el mdico. Sentir clicos uterinos es un buen signo para detener la actividad fsica.  Contine comiendo alimentos sanos con  regularidad.  Use un sostn que le brinde buen soporte si le duelen las mamas.  No se d baos de inmersin en agua caliente, baos turcos ni saunas.  Use el cinturn de seguridad en todo momento mientras conduce.  No coma carne cruda ni queso sin cocinar; evite el contacto con las bandejas sanitarias de los gatos y la tierra que estos animales usan. Estos elementos contienen grmenes que pueden causar defectos congnitos en el beb.  Tome las vitaminas prenatales.  Tome entre 1500 y 2000mg de calcio diariamente comenzando en la semana20 del embarazo hasta el parto.  Si est estreida, pruebe un laxante suave (si el mdico lo autoriza). Consuma ms alimentos ricos en fibra, como vegetales y frutas frescos y cereales integrales. Beba gran cantidad de lquido para mantener la orina de tono claro o color amarillo plido.  Dese baos de asiento con agua tibia para aliviar el dolor o las molestias causadas por las hemorroides. Use una crema para las hemorroides si el mdico la autoriza.  Si tiene venas varicosas, use medias de descanso. Eleve los pies durante 15minutos, 3 o 4veces por da. Limite el consumo de sal en su dieta.  Evite levantar objetos pesados, use zapatos de tacones bajos y mantenga una buena postura.  Descanse con las piernas elevadas si tiene   calambres o dolor de cintura.  Visite a su dentista si no lo ha hecho durante el embarazo. Use un cepillo de dientes blando para higienizarse los dientes y psese el hilo dental con suavidad.  Puede seguir manteniendo relaciones sexuales, a menos que el mdico le indique lo contrario.  No haga viajes largos excepto que sea absolutamente necesario y solo con la autorizacin del mdico.  Tome clases prenatales para entender, practicar y hacer preguntas sobre el trabajo de parto y el parto.  Haga un ensayo de la partida al hospital.  Prepare el bolso que llevar al hospital.  Prepare la habitacin del beb.  Concurra a todas  las visitas prenatales segn las indicaciones de su mdico.  SOLICITE ATENCIN MDICA SI:  No est segura de que est en trabajo de parto o de que ha roto la bolsa de las aguas.  Tiene mareos.  Siente clicos leves, presin en la pelvis o dolor persistente en el abdomen.  Tiene nuseas, vmitos o diarrea persistentes.  Observa una secrecin vaginal con mal olor.  Siente dolor al orinar.  SOLICITE ATENCIN MDICA DE INMEDIATO SI:  Tiene fiebre.  Tiene una prdida de lquido por la vagina.  Tiene sangrado o pequeas prdidas vaginales.  Siente dolor intenso o clicos en el abdomen.  Sube o baja de peso rpidamente.  Tiene dificultad para respirar y siente dolor de pecho.  Sbitamente se le hinchan mucho el rostro, las manos, los tobillos, los pies o las piernas.  No ha sentido los movimientos del beb durante una hora.  Siente un dolor de cabeza intenso que no se alivia con medicamentos.  Su visin se modifica.  Esta informacin no tiene como fin reemplazar el consejo del mdico. Asegrese de hacerle al mdico cualquier pregunta que tenga. Document Released: 02/17/2005 Document Revised: 05/31/2014 Document Reviewed: 07/11/2012 Elsevier Interactive Patient Education  2017 [employer]  

## 2016-08-17 NOTE — MAU Note (Signed)
PT  SAYS SHE WAS LAYING  DOWN    AT 0030   AND  FELT  LOWER ABD  PAIN -    LAST SEX-    WED.     ON ARRIVAL -    REMOVED  FROM LOBBY WITH  W/C - NO ENGLISH-

## 2016-08-17 NOTE — MAU Note (Signed)
Pt reports abdominal pain. Denies LOF or vag bleeding. Has some vag discharge. +FM

## 2016-08-18 ENCOUNTER — Ambulatory Visit (INDEPENDENT_AMBULATORY_CARE_PROVIDER_SITE_OTHER): Payer: Medicaid Other | Admitting: Advanced Practice Midwife

## 2016-08-18 VITALS — BP 106/66 | HR 89 | Wt 167.6 lb

## 2016-08-18 DIAGNOSIS — Z3482 Encounter for supervision of other normal pregnancy, second trimester: Secondary | ICD-10-CM

## 2016-08-18 DIAGNOSIS — R102 Pelvic and perineal pain: Secondary | ICD-10-CM

## 2016-08-18 DIAGNOSIS — Z3480 Encounter for supervision of other normal pregnancy, unspecified trimester: Secondary | ICD-10-CM

## 2016-08-18 DIAGNOSIS — O26892 Other specified pregnancy related conditions, second trimester: Secondary | ICD-10-CM

## 2016-08-18 LAB — CULTURE, OB URINE: Culture: NO GROWTH

## 2016-08-18 NOTE — Progress Notes (Signed)
Video Interpreter # [id number]

## 2016-08-18 NOTE — Progress Notes (Signed)
   PRENATAL VISIT NOTE  Subjective:  Paula Gamble is a 21 y.o. G3P0020 at 6729w2d being seen today for ongoing prenatal care.  She is currently monitored for the following issues for this low-risk pregnancy and has Encounter for supervision of normal pregnancy, antepartum on her problem list.  Patient reports constant low abdominal pain, worse with walking.  Contractions: Not present. Vag. Bleeding: None.  Movement: Present. Denies leaking of fluid.   The following portions of the patient's history were reviewed and updated as appropriate: allergies, current medications, past family history, past medical history, past social history, past surgical history and problem list. Problem list updated.  Objective:   Vitals:   08/18/16 1116  BP: 106/66  Pulse: 89  Weight: 167 lb 9.6 oz (76 kg)    Fetal Status: Fetal Heart Rate (bpm): 153 Fundal Height: 25 cm Movement: Present     General:  Alert, oriented and cooperative. Patient is in no acute distress.  Skin: Skin is warm and dry. No rash noted.   Cardiovascular: Normal heart rate noted  Respiratory: Normal respiratory effort, no problems with respiration noted  Abdomen: Soft, gravid, appropriate for gestational age. Pain/Pressure: Present     Pelvic:  Cervical exam deferred        Extremities: Normal range of motion.  Edema: None  Mental Status: Normal mood and affect. Normal behavior. Normal judgment and thought content.   Assessment and Plan:  Pregnancy: G3P0020 at 4829w2d  1. Supervision of other normal pregnancy, antepartum   2. Pelvic pain affecting pregnancy in second trimester, antepartum --Pt seen in MAU multiple times for abdominal pain, last visit yesterday and was treated for constipation.  Pt reports BM since yesterday with no improvement in pain.  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  Pt has Rx for pregnancy support belt but has not picked up.   Recommend rest/ice/heat/Tylenol/pregnancy support belt for pain.  U/A done today.  Yesterday was wnl.  Preterm labor symptoms and general obstetric precautions including but not limited to vaginal bleeding, contractions, leaking of fluid and fetal movement were reviewed in detail with the patient. Please refer to After Visit Summary for other counseling recommendations.  Return in about 4 weeks (around 09/15/2016).   Hurshel PartyLisa A Leftwich-Kirby, CNM

## 2016-08-18 NOTE — Patient Instructions (Signed)
Tercer trimestre de embarazo (Third Trimester of Pregnancy) El tercer trimestre comprende desde la semana29 hasta la semana42, es decir, desde el mes7 hasta el mes9. El tercer trimestre es un perodo en el que el feto crece rpidamente. Hacia el final del noveno mes, el feto mide alrededor de 20pulgadas (45cm) de largo y pesa entre 6 y 10 libras (2,700 y 4,500kg). CAMBIOS EN EL ORGANISMO Su organismo atraviesa por muchos cambios durante el embarazo, y estos varan de una mujer a otra.  Seguir aumentando de peso. Es de esperar que aumente entre 25 y 35libras (11 y 16kg) hacia el final del embarazo.  Podrn aparecer las primeras estras en las caderas, el abdomen y las mamas.  Puede tener necesidad de orinar con ms frecuencia porque el feto baja hacia la pelvis y ejerce presin sobre la vejiga.  Debido al embarazo podr sentir acidez estomacal con frecuencia.  Puede estar estreida, ya que ciertas hormonas enlentecen los movimientos de los msculos que empujan los desechos a travs de los intestinos.  Pueden aparecer hemorroides o abultarse e hincharse las venas (venas varicosas).  Puede sentir dolor plvico debido al aumento de peso y a que las hormonas del embarazo relajan las articulaciones entre los huesos de la pelvis. El dolor de espalda puede ser consecuencia de la sobrecarga de los msculos que soportan la postura.  Tal vez haya cambios en el cabello que pueden incluir su engrosamiento, crecimiento rpido y cambios en la textura. Adems, a algunas mujeres se les cae el cabello durante o despus del embarazo, o tienen el cabello seco o fino. Lo ms probable es que el cabello se le normalice despus del nacimiento del beb.  Las mamas seguirn creciendo y le dolern. A veces, puede haber una secrecin amarilla de las mamas llamada calostro.  El ombligo puede salir hacia afuera.  Puede sentir que le falta el aire debido a que se expande el tero.  Puede notar que el feto  "baja" o lo siente ms bajo, en el abdomen.  Puede tener una prdida de secrecin mucosa con sangre. Esto suele ocurrir en el trmino de unos pocos das a una semana antes de que comience el trabajo de parto.  El cuello del tero se vuelve delgado y blando (se borra) cerca de la fecha de parto. QU DEBE ESPERAR EN LOS EXMENES PRENATALES Le harn exmenes prenatales cada 2semanas hasta la semana36. A partir de ese momento le harn exmenes semanales. Durante una visita prenatal de rutina:  La pesarn para asegurarse de que usted y el feto estn creciendo normalmente.  Le tomarn la presin arterial.  Le medirn el abdomen para controlar el desarrollo del beb.  Se escucharn los latidos cardacos fetales.  Se evaluarn los resultados de los estudios solicitados en visitas anteriores.  Le revisarn el cuello del tero cuando est prxima la fecha de parto para controlar si este se ha borrado. Alrededor de la semana36, el mdico le revisar el cuello del tero. Al mismo tiempo, realizar un anlisis de las secreciones del tejido vaginal. Este examen es para determinar si hay un tipo de bacteria, estreptococo Grupo B. El mdico le explicar esto con ms detalle. El mdico puede preguntarle lo siguiente:  Cmo le gustara que fuera el parto.  Cmo se siente.  Si siente los movimientos del beb.  Si ha tenido sntomas anormales, como prdida de lquido, sangrado, dolores de cabeza intensos o clicos abdominales.  Si est consumiendo algn producto que contenga tabaco, como cigarrillos, tabaco de mascar y   cigarrillos electrnicos.  Si tiene alguna pregunta. Otros exmenes o estudios de deteccin que pueden realizarse durante el tercer trimestre incluyen lo siguiente:  Anlisis de sangre para controlar los niveles de hierro (anemia).  Controles fetales para determinar su salud, nivel de actividad y crecimiento. Si tiene alguna enfermedad o hay problemas durante el embarazo, le harn  estudios.  Prueba del VIH (virus de inmunodeficiencia humana). Si corre un riesgo alto, pueden realizarle una prueba de deteccin del VIH durante el tercer trimestre del embarazo. FALSO TRABAJO DE PARTO Es posible que sienta contracciones leves e irregulares que finalmente desaparecen. Se llaman contracciones de Braxton Hicks o falso trabajo de parto. Las contracciones pueden durar horas, das o incluso semanas, antes de que el verdadero trabajo de parto se inicie. Si las contracciones ocurren a intervalos regulares, se intensifican o se hacen dolorosas, lo mejor es que la revise el mdico. SIGNOS DE TRABAJO DE PARTO  Clicos de tipo menstrual.  Contracciones cada 5minutos o menos.  Contracciones que comienzan en la parte superior del tero y se extienden hacia abajo, a la zona inferior del abdomen y la espalda.  Sensacin de mayor presin en la pelvis o dolor de espalda.  Una secrecin de mucosidad acuosa o con sangre que sale de la vagina. Si tiene alguno de estos signos antes de la semana37 del embarazo, llame a su mdico de inmediato. Debe concurrir al hospital para que la controlen inmediatamente. INSTRUCCIONES PARA EL CUIDADO EN EL HOGAR  Evite fumar, consumir hierbas, beber alcohol y tomar frmacos que no le hayan recetado. Estas sustancias qumicas afectan la formacin y el desarrollo del beb.  No consuma ningn producto que contenga tabaco, lo que incluye cigarrillos, tabaco de mascar y cigarrillos electrnicos. Si necesita ayuda para dejar de fumar, consulte al mdico. Puede recibir asesoramiento y otro tipo de recursos para dejar de fumar.  Siga las indicaciones del mdico en relacin con el uso de medicamentos. Durante el embarazo, hay medicamentos que son seguros de tomar y otros que no.  Haga ejercicio solamente como se lo haya indicado el mdico. Sentir clicos uterinos es un buen signo para detener la actividad fsica.  Contine comiendo alimentos sanos con  regularidad.  Use un sostn que le brinde buen soporte si le duelen las mamas.  No se d baos de inmersin en agua caliente, baos turcos ni saunas.  Use el cinturn de seguridad en todo momento mientras conduce.  No coma carne cruda ni queso sin cocinar; evite el contacto con las bandejas sanitarias de los gatos y la tierra que estos animales usan. Estos elementos contienen grmenes que pueden causar defectos congnitos en el beb.  Tome las vitaminas prenatales.  Tome entre 1500 y 2000mg de calcio diariamente comenzando en la semana20 del embarazo hasta el parto.  Si est estreida, pruebe un laxante suave (si el mdico lo autoriza). Consuma ms alimentos ricos en fibra, como vegetales y frutas frescos y cereales integrales. Beba gran cantidad de lquido para mantener la orina de tono claro o color amarillo plido.  Dese baos de asiento con agua tibia para aliviar el dolor o las molestias causadas por las hemorroides. Use una crema para las hemorroides si el mdico la autoriza.  Si tiene venas varicosas, use medias de descanso. Eleve los pies durante 15minutos, 3 o 4veces por da. Limite el consumo de sal en su dieta.  Evite levantar objetos pesados, use zapatos de tacones bajos y mantenga una buena postura.  Descanse con las piernas elevadas si tiene   calambres o dolor de cintura.  Visite a su dentista si no lo ha hecho durante el embarazo. Use un cepillo de dientes blando para higienizarse los dientes y psese el hilo dental con suavidad.  Puede seguir manteniendo relaciones sexuales, a menos que el mdico le indique lo contrario.  No haga viajes largos excepto que sea absolutamente necesario y solo con la autorizacin del mdico.  Tome clases prenatales para entender, practicar y hacer preguntas sobre el trabajo de parto y el parto.  Haga un ensayo de la partida al hospital.  Prepare el bolso que llevar al hospital.  Prepare la habitacin del beb.  Concurra a todas  las visitas prenatales segn las indicaciones de su mdico.  SOLICITE ATENCIN MDICA SI:  No est segura de que est en trabajo de parto o de que ha roto la bolsa de las aguas.  Tiene mareos.  Siente clicos leves, presin en la pelvis o dolor persistente en el abdomen.  Tiene nuseas, vmitos o diarrea persistentes.  Observa una secrecin vaginal con mal olor.  Siente dolor al orinar.  SOLICITE ATENCIN MDICA DE INMEDIATO SI:  Tiene fiebre.  Tiene una prdida de lquido por la vagina.  Tiene sangrado o pequeas prdidas vaginales.  Siente dolor intenso o clicos en el abdomen.  Sube o baja de peso rpidamente.  Tiene dificultad para respirar y siente dolor de pecho.  Sbitamente se le hinchan mucho el rostro, las manos, los tobillos, los pies o las piernas.  No ha sentido los movimientos del beb durante una hora.  Siente un dolor de cabeza intenso que no se alivia con medicamentos.  Su visin se modifica.  Esta informacin no tiene como fin reemplazar el consejo del mdico. Asegrese de hacerle al mdico cualquier pregunta que tenga. Document Released: 02/17/2005 Document Revised: 05/31/2014 Document Reviewed: 07/11/2012 Elsevier Interactive Patient Education  2017 [employer]  

## 2016-08-19 LAB — GC/CHLAMYDIA PROBE AMP (~~LOC~~) NOT AT ARMC
Chlamydia: NEGATIVE
NEISSERIA GONORRHEA: NEGATIVE

## 2016-09-06 ENCOUNTER — Inpatient Hospital Stay (HOSPITAL_COMMUNITY)
Admission: AD | Admit: 2016-09-06 | Discharge: 2016-09-06 | Disposition: A | Discharge status: Home / Self Care | Payer: Medicaid Other | Source: Ambulatory Visit | Attending: Family Medicine | Admitting: Family Medicine

## 2016-09-06 ENCOUNTER — Encounter (HOSPITAL_COMMUNITY): Payer: Self-pay

## 2016-09-06 DIAGNOSIS — Z87891 Personal history of nicotine dependence: Secondary | ICD-10-CM | POA: Insufficient documentation

## 2016-09-06 DIAGNOSIS — O26893 Other specified pregnancy related conditions, third trimester: Secondary | ICD-10-CM | POA: Insufficient documentation

## 2016-09-06 DIAGNOSIS — Z3A28 28 weeks gestation of pregnancy: Secondary | ICD-10-CM | POA: Diagnosis not present

## 2016-09-06 DIAGNOSIS — O26899 Other specified pregnancy related conditions, unspecified trimester: Secondary | ICD-10-CM | POA: Diagnosis not present

## 2016-09-06 DIAGNOSIS — R109 Unspecified abdominal pain: Secondary | ICD-10-CM | POA: Diagnosis present

## 2016-09-06 LAB — COMPREHENSIVE METABOLIC PANEL
ALT: 13 U/L — AB (ref 14–54)
AST: 16 U/L (ref 15–41)
Albumin: 3.1 g/dL — ABNORMAL LOW (ref 3.5–5.0)
Alkaline Phosphatase: 71 U/L (ref 38–126)
Anion gap: 6 (ref 5–15)
BUN: 10 mg/dL (ref 6–20)
CHLORIDE: 109 mmol/L (ref 101–111)
CO2: 25 mmol/L (ref 22–32)
CREATININE: 0.48 mg/dL (ref 0.44–1.00)
Calcium: 8.7 mg/dL — ABNORMAL LOW (ref 8.9–10.3)
Glucose, Bld: 90 mg/dL (ref 65–99)
Potassium: 3.7 mmol/L (ref 3.5–5.1)
Sodium: 140 mmol/L (ref 135–145)
TOTAL PROTEIN: 6 g/dL — AB (ref 6.5–8.1)
Total Bilirubin: 0.4 mg/dL (ref 0.3–1.2)

## 2016-09-06 LAB — URINALYSIS, ROUTINE W REFLEX MICROSCOPIC
Bilirubin Urine: NEGATIVE
GLUCOSE, UA: NEGATIVE mg/dL
HGB URINE DIPSTICK: NEGATIVE
KETONES UR: NEGATIVE mg/dL
Nitrite: NEGATIVE
PH: 7 (ref 5.0–8.0)
PROTEIN: NEGATIVE mg/dL
Specific Gravity, Urine: 1.004 — ABNORMAL LOW (ref 1.005–1.030)

## 2016-09-06 LAB — CBC
HCT: 31.5 % — ABNORMAL LOW (ref 36.0–46.0)
Hemoglobin: 10.5 g/dL — ABNORMAL LOW (ref 12.0–15.0)
MCH: 32.5 pg (ref 26.0–34.0)
MCHC: 33.3 g/dL (ref 30.0–36.0)
MCV: 97.5 fL (ref 78.0–100.0)
PLATELETS: 158 10*3/uL (ref 150–400)
RBC: 3.23 MIL/uL — AB (ref 3.87–5.11)
RDW: 13.5 % (ref 11.5–15.5)
WBC: 13.1 10*3/uL — AB (ref 4.0–10.5)

## 2016-09-06 MED ORDER — LACTATED RINGERS IV BOLUS (SEPSIS)
1000.0000 mL | Freq: Once | INTRAVENOUS | Status: AC
  Administered 2016-09-06: 1000 mL via INTRAVENOUS

## 2016-09-06 MED ORDER — NALBUPHINE HCL 10 MG/ML IJ SOLN
10.0000 mg | Freq: Once | INTRAMUSCULAR | Status: AC
  Administered 2016-09-06: 10 mg via INTRAVENOUS
  Filled 2016-09-06: qty 1

## 2016-09-06 MED ORDER — PROMETHAZINE HCL 25 MG/ML IJ SOLN
12.5000 mg | Freq: Once | INTRAMUSCULAR | Status: AC
  Administered 2016-09-06: 12.5 mg via INTRAVENOUS
  Filled 2016-09-06: qty 1

## 2016-09-06 NOTE — MAU Note (Signed)
Pt c/o abdominal pain that started 1 hour ago. Does not feel like pain that she usually has. Pt denies LOF or vag bleeding. +FM

## 2016-09-06 NOTE — MAU Provider Note (Signed)
History     CSN: [id number]  Arrival date and time: 09/06/16 4098   First Provider Initiated Contact with Patient 09/06/16 [phone]      Chief Complaint  Patient presents with  . Abdominal Pain   Paula Gamble is a 21 y.o. G3P0020 at G3P0020 who presents today with abdominal pain. This is her 10th MAU visit for abdominal pain since the start of the pregnancy. She denies any VB or LOF. She reports normal fetal movement.   Yvette with Status interpreter used.    Abdominal Pain  This is a new problem. The current episode started today. The onset quality is sudden. The problem occurs constantly. The problem has been unchanged. The pain is located in the generalized abdominal region. The pain is at a severity of 10/10. The quality of the pain is sharp. The abdominal pain does not radiate. Pertinent negatives include no fever, nausea or vomiting. Nothing aggravates the pain. The pain is relieved by nothing. She has tried nothing for the symptoms.    Past Medical History:  Diagnosis Date  . Pyelonephritis     Past Surgical History:  Procedure Laterality Date  . DILATION AND CURETTAGE OF UTERUS     x2    Family History  Problem Relation Age of Onset  . Diabetes Paternal Uncle   . Cancer Maternal Grandmother   . Diabetes Maternal Grandmother   . Hypertension Maternal Grandmother   . Cancer Paternal Grandmother     liver    Social History  Substance Use Topics  . Smoking status: Former Games developer  . Smokeless tobacco: Never Used  . Alcohol use No     Comment: Last drank in Oct/2017, per pt drank 20 beers     Allergies:  Allergies  Allergen Reactions  . Latex Itching    Prescriptions Prior to Admission  Medication Sig Dispense Refill Last Dose  . bisacodyl (DULCOLAX) 5 MG EC tablet Take 1 tablet (5 mg total) by mouth daily as needed for mild constipation or moderate constipation. 30 tablet 0 Taking  . Elastic Bandages & Supports (COMFORT FIT MATERNITY SUPP MED) MISC  1 Device by Does not apply route daily as needed. (Patient not taking: Reported on 08/18/2016) 1 each 0 Not Taking  . polyethylene glycol (MIRALAX / GLYCOLAX) packet Take 17 g by mouth daily as needed for mild constipation. 17 each 0 Taking  . Prenat w/o A Vit-FeFum-FePo-FA (CONCEPT OB) 130-92.4-1 MG CAPS Take 1 capsule by mouth daily.   Taking  . terconazole (TERAZOL 7) 0.4 % vaginal cream Place 1 applicator vaginally at bedtime. Use for 7 days. (Patient not taking: Reported on 08/18/2016) 45 g 0 Not Taking    Review of Systems  Constitutional: Negative for chills and fever.  Gastrointestinal: Positive for abdominal pain. Negative for nausea and vomiting.  Genitourinary: Negative for vaginal bleeding and vaginal discharge.   Physical Exam   Last menstrual period 02/23/2016.  Physical Exam  Nursing note and vitals reviewed. Constitutional: She is oriented to person, place, and time. She appears well-nourished. She appears distressed.  HENT:  Head: Normocephalic.  Cardiovascular: Normal rate.   Respiratory: Effort normal.  GI: Soft. There is no tenderness. There is no rebound.  Genitourinary:  Genitourinary Comments: Cervix: closed/thick/posterior   Neurological: She is alert and oriented to person, place, and time.  Skin: Skin is warm and dry.  Psychiatric: She has a normal mood and affect.   FHT 125, moderate with 15x15 accels, no decels Toco: no UCs  MAU Course  Procedures  MDM Patient has had nubain and phenergan. She is resting comfortably as this time.   Assessment and Plan   1. Abdominal pain affecting pregnancy   2. [redacted] weeks gestation of pregnancy    DC home Comfort measures reviewed  3rd Trimester precautions  PTL precautions  Fetal kick counts RX: none  Return to MAU as needed FU with OB as planned  Follow-up Information    [facility].   Specialty:  Obstetrics and Gynecology Contact information: [address] [place] [address] [phone]           Tawnya Crook 09/06/2016, 4:17 AM

## 2016-09-06 NOTE — MAU Note (Signed)
Went into patient room to adjust fetal monitors, pt appears calm and is resting quietly with mom at bedside.

## 2016-09-06 NOTE — Discharge Instructions (Signed)
Dolor abdominal durante el embarazo  (Abdominal Pain During Pregnancy)  El dolor de vientre (abdominal) es habitual durante el embarazo. Generalmente no se trata de un problema grave. Otras veces puede ser un signo de que algo no anda bien. Siempre comuníquese con su médico si tiene dolor abdominal.  CUIDADOS EN EL HOGAR  Controle el dolor para ver si hay cambios. Las indicaciones que siguen pueden ayudarla a sentirse mejor:  · Notenga sexo (relaciones sexuales) ni se coloque nada dentro de la vagina hasta que se sienta mejor.  · Haga reposo hasta que el dolor se calme.  · Si siente ganas de vomitar (náuseas ) beba líquidos claros. No consuma alimentos sólidos hasta que se sienta mejor.  · Sólo tome los medicamentos que le haya indicado su médico.  · Cumpla con las visitas al médico según las indicaciones.  SOLICITE AYUDA DE INMEDIATO SI:  · Tiene un sangrado, pierde líquido o elimina trozos de tejido por la vagina.  · Siente más dolor o cólicos.  · Comienza a vomitar.  · Siente dolor al orinar u observa sangre en la orina.  · Tiene fiebre.  · No siente que el bebé se mueva mucho.  · Se siente muy débil o cree que va a desmayarse.  · Tiene dificultad para respirar con o sin dolor en el vientre.  · Siente un dolor de cabeza muy intenso y dolor en el vientre.  · Observa que sale un líquido por la vagina y tiene dolor abdominal.  · La materia fecal es líquida (diarrea).  · El dolor en el viente no desaparece, o empeora, luego de hacer reposo.    ASEGÚRESE DE QUE:  · Comprende estas instrucciones.  · Controlará su afección.  · Recibirá ayuda de inmediato si no mejora o si empeora.    Esta información no tiene como fin reemplazar el consejo del médico. Asegúrese de hacerle al médico cualquier pregunta que tenga.  Document Released: 01/20/2011 Document Revised: 09/01/2015 Document Reviewed: 12/07/2012  Elsevier Interactive Patient Education © 2017 Elsevier Inc.

## 2016-09-15 ENCOUNTER — Ambulatory Visit (INDEPENDENT_AMBULATORY_CARE_PROVIDER_SITE_OTHER): Payer: Medicaid Other | Admitting: Advanced Practice Midwife

## 2016-09-15 ENCOUNTER — Other Ambulatory Visit (HOSPITAL_COMMUNITY)
Admission: RE | Admit: 2016-09-15 | Discharge: 2016-09-15 | Disposition: A | Discharge status: Home / Self Care | Payer: Medicaid Other | Source: Ambulatory Visit | Attending: Advanced Practice Midwife | Admitting: Advanced Practice Midwife

## 2016-09-15 VITALS — BP 110/67 | HR 92 | Wt 176.6 lb

## 2016-09-15 DIAGNOSIS — Z23 Encounter for immunization: Secondary | ICD-10-CM | POA: Diagnosis not present

## 2016-09-15 DIAGNOSIS — B373 Candidiasis of vulva and vagina: Secondary | ICD-10-CM | POA: Diagnosis not present

## 2016-09-15 DIAGNOSIS — B3731 Acute candidiasis of vulva and vagina: Secondary | ICD-10-CM

## 2016-09-15 DIAGNOSIS — O98813 Other maternal infectious and parasitic diseases complicating pregnancy, third trimester: Secondary | ICD-10-CM

## 2016-09-15 DIAGNOSIS — Z3480 Encounter for supervision of other normal pregnancy, unspecified trimester: Secondary | ICD-10-CM | POA: Insufficient documentation

## 2016-09-15 DIAGNOSIS — Z3483 Encounter for supervision of other normal pregnancy, third trimester: Secondary | ICD-10-CM

## 2016-09-15 MED ORDER — TERCONAZOLE 0.4 % VA CREA: 1.0000 | TOPICAL_CREAM | Freq: Every day | VAGINAL | 0 refills | Status: DC

## 2016-09-15 NOTE — Patient Instructions (Signed)
Tercer trimestre de embarazo (Third Trimester of Pregnancy) El tercer trimestre comprende desde la semana29 hasta la semana42, es decir, desde el mes7 hasta el mes9. El tercer trimestre es un perodo en el que el feto crece rpidamente. Hacia el final del noveno mes, el feto mide alrededor de 20pulgadas (45cm) de largo y pesa entre 6 y 10 libras (2,700 y 4,500kg). CAMBIOS EN EL ORGANISMO Su organismo atraviesa por muchos cambios durante el embarazo, y estos varan de una mujer a otra.  Seguir aumentando de peso. Es de esperar que aumente entre 25 y 35libras (11 y 16kg) hacia el final del embarazo.  Podrn aparecer las primeras estras en las caderas, el abdomen y las mamas.  Puede tener necesidad de orinar con ms frecuencia porque el feto baja hacia la pelvis y ejerce presin sobre la vejiga.  Debido al embarazo podr sentir acidez estomacal con frecuencia.  Puede estar estreida, ya que ciertas hormonas enlentecen los movimientos de los msculos que empujan los desechos a travs de los intestinos.  Pueden aparecer hemorroides o abultarse e hincharse las venas (venas varicosas).  Puede sentir dolor plvico debido al aumento de peso y a que las hormonas del embarazo relajan las articulaciones entre los huesos de la pelvis. El dolor de espalda puede ser consecuencia de la sobrecarga de los msculos que soportan la postura.  Tal vez haya cambios en el cabello que pueden incluir su engrosamiento, crecimiento rpido y cambios en la textura. Adems, a algunas mujeres se les cae el cabello durante o despus del embarazo, o tienen el cabello seco o fino. Lo ms probable es que el cabello se le normalice despus del nacimiento del beb.  Las mamas seguirn creciendo y le dolern. A veces, puede haber una secrecin amarilla de las mamas llamada calostro.  El ombligo puede salir hacia afuera.  Puede sentir que le falta el aire debido a que se expande el tero.  Puede notar que el feto  "baja" o lo siente ms bajo, en el abdomen.  Puede tener una prdida de secrecin mucosa con sangre. Esto suele ocurrir en el trmino de unos pocos das a una semana antes de que comience el trabajo de parto.  El cuello del tero se vuelve delgado y blando (se borra) cerca de la fecha de parto. QU DEBE ESPERAR EN LOS EXMENES PRENATALES Le harn exmenes prenatales cada 2semanas hasta la semana36. A partir de ese momento le harn exmenes semanales. Durante una visita prenatal de rutina:  La pesarn para asegurarse de que usted y el feto estn creciendo normalmente.  Le tomarn la presin arterial.  Le medirn el abdomen para controlar el desarrollo del beb.  Se escucharn los latidos cardacos fetales.  Se evaluarn los resultados de los estudios solicitados en visitas anteriores.  Le revisarn el cuello del tero cuando est prxima la fecha de parto para controlar si este se ha borrado. Alrededor de la semana36, el mdico le revisar el cuello del tero. Al mismo tiempo, realizar un anlisis de las secreciones del tejido vaginal. Este examen es para determinar si hay un tipo de bacteria, estreptococo Grupo B. El mdico le explicar esto con ms detalle. El mdico puede preguntarle lo siguiente:  Cmo le gustara que fuera el parto.  Cmo se siente.  Si siente los movimientos del beb.  Si ha tenido sntomas anormales, como prdida de lquido, sangrado, dolores de cabeza intensos o clicos abdominales.  Si est consumiendo algn producto que contenga tabaco, como cigarrillos, tabaco de mascar y   cigarrillos electrnicos.  Si tiene alguna pregunta. Otros exmenes o estudios de deteccin que pueden realizarse durante el tercer trimestre incluyen lo siguiente:  Anlisis de sangre para controlar los niveles de hierro (anemia).  Controles fetales para determinar su salud, nivel de actividad y crecimiento. Si tiene alguna enfermedad o hay problemas durante el embarazo, le harn  estudios.  Prueba del VIH (virus de inmunodeficiencia humana). Si corre un riesgo alto, pueden realizarle una prueba de deteccin del VIH durante el tercer trimestre del embarazo. FALSO TRABAJO DE PARTO Es posible que sienta contracciones leves e irregulares que finalmente desaparecen. Se llaman contracciones de Braxton Hicks o falso trabajo de parto. Las contracciones pueden durar horas, das o incluso semanas, antes de que el verdadero trabajo de parto se inicie. Si las contracciones ocurren a intervalos regulares, se intensifican o se hacen dolorosas, lo mejor es que la revise el mdico. SIGNOS DE TRABAJO DE PARTO  Clicos de tipo menstrual.  Contracciones cada 5minutos o menos.  Contracciones que comienzan en la parte superior del tero y se extienden hacia abajo, a la zona inferior del abdomen y la espalda.  Sensacin de mayor presin en la pelvis o dolor de espalda.  Una secrecin de mucosidad acuosa o con sangre que sale de la vagina. Si tiene alguno de estos signos antes de la semana37 del embarazo, llame a su mdico de inmediato. Debe concurrir al hospital para que la controlen inmediatamente. INSTRUCCIONES PARA EL CUIDADO EN EL HOGAR  Evite fumar, consumir hierbas, beber alcohol y tomar frmacos que no le hayan recetado. Estas sustancias qumicas afectan la formacin y el desarrollo del beb.  No consuma ningn producto que contenga tabaco, lo que incluye cigarrillos, tabaco de mascar y cigarrillos electrnicos. Si necesita ayuda para dejar de fumar, consulte al mdico. Puede recibir asesoramiento y otro tipo de recursos para dejar de fumar.  Siga las indicaciones del mdico en relacin con el uso de medicamentos. Durante el embarazo, hay medicamentos que son seguros de tomar y otros que no.  Haga ejercicio solamente como se lo haya indicado el mdico. Sentir clicos uterinos es un buen signo para detener la actividad fsica.  Contine comiendo alimentos sanos con  regularidad.  Use un sostn que le brinde buen soporte si le duelen las mamas.  No se d baos de inmersin en agua caliente, baos turcos ni saunas.  Use el cinturn de seguridad en todo momento mientras conduce.  No coma carne cruda ni queso sin cocinar; evite el contacto con las bandejas sanitarias de los gatos y la tierra que estos animales usan. Estos elementos contienen grmenes que pueden causar defectos congnitos en el beb.  Tome las vitaminas prenatales.  Tome entre 1500 y 2000mg de calcio diariamente comenzando en la semana20 del embarazo hasta el parto.  Si est estreida, pruebe un laxante suave (si el mdico lo autoriza). Consuma ms alimentos ricos en fibra, como vegetales y frutas frescos y cereales integrales. Beba gran cantidad de lquido para mantener la orina de tono claro o color amarillo plido.  Dese baos de asiento con agua tibia para aliviar el dolor o las molestias causadas por las hemorroides. Use una crema para las hemorroides si el mdico la autoriza.  Si tiene venas varicosas, use medias de descanso. Eleve los pies durante 15minutos, 3 o 4veces por da. Limite el consumo de sal en su dieta.  Evite levantar objetos pesados, use zapatos de tacones bajos y mantenga una buena postura.  Descanse con las piernas elevadas si tiene   calambres o dolor de cintura.  Visite a su dentista si no lo ha hecho durante el embarazo. Use un cepillo de dientes blando para higienizarse los dientes y psese el hilo dental con suavidad.  Puede seguir manteniendo relaciones sexuales, a menos que el mdico le indique lo contrario.  No haga viajes largos excepto que sea absolutamente necesario y solo con la autorizacin del mdico.  Tome clases prenatales para entender, practicar y hacer preguntas sobre el trabajo de parto y el parto.  Haga un ensayo de la partida al hospital.  Prepare el bolso que llevar al hospital.  Prepare la habitacin del beb.  Concurra a todas  las visitas prenatales segn las indicaciones de su mdico.  SOLICITE ATENCIN MDICA SI:  No est segura de que est en trabajo de parto o de que ha roto la bolsa de las aguas.  Tiene mareos.  Siente clicos leves, presin en la pelvis o dolor persistente en el abdomen.  Tiene nuseas, vmitos o diarrea persistentes.  Observa una secrecin vaginal con mal olor.  Siente dolor al orinar.  SOLICITE ATENCIN MDICA DE INMEDIATO SI:  Tiene fiebre.  Tiene una prdida de lquido por la vagina.  Tiene sangrado o pequeas prdidas vaginales.  Siente dolor intenso o clicos en el abdomen.  Sube o baja de peso rpidamente.  Tiene dificultad para respirar y siente dolor de pecho.  Sbitamente se le hinchan mucho el rostro, las manos, los tobillos, los pies o las piernas.  No ha sentido los movimientos del beb durante una hora.  Siente un dolor de cabeza intenso que no se alivia con medicamentos.  Su visin se modifica.  Esta informacin no tiene como fin reemplazar el consejo del mdico. Asegrese de hacerle al mdico cualquier pregunta que tenga. Document Released: 02/17/2005 Document Revised: 05/31/2014 Document Reviewed: 07/11/2012 Elsevier Interactive Patient Education  2017 [employer]  

## 2016-09-15 NOTE — Progress Notes (Signed)
   PRENATAL VISIT NOTE  Subjective:  Paula Gamble is a 21 y.o. G3P0020 at [redacted]w[redacted]d being seen today for ongoing prenatal care.  She is currently monitored for the following issues for this low-risk pregnancy and has Encounter for supervision of normal pregnancy, antepartum on her problem list.  Patient reports vaginal itching and thick white discharge.  Contractions: Irregular. Vag. Bleeding: None.  Movement: Present. Denies leaking of fluid.   The following portions of the patient's history were reviewed and updated as appropriate: allergies, current medications, past family history, past medical history, past social history, past surgical history and problem list. Problem list updated.  Objective:   Vitals:   09/15/16 0816  BP: 110/67  Pulse: 92  Weight: 176 lb 9.6 oz (80.1 kg)    Fetal Status: Fetal Heart Rate (bpm): 145 Fundal Height: 28 cm Movement: Present     General:  Alert, oriented and cooperative. Patient is in no acute distress.  Skin: Skin is warm and dry. No rash noted.   Cardiovascular: Normal heart rate noted  Respiratory: Normal respiratory effort, no problems with respiration noted  Abdomen: Soft, gravid, appropriate for gestational age. Pain/Pressure: Present     Pelvic:  Cervical exam deferred        Extremities: Normal range of motion.  Edema: Trace  Mental Status: Normal mood and affect. Normal behavior. Normal judgment and thought content.   Assessment and Plan:  Pregnancy: G3P0020 at [redacted]w[redacted]d  1. Supervision of other normal pregnancy, antepartum  - HIV antibody - Glucose Tolerance, 2 Hours w/1 Hour - RPR - Tdap vaccine greater than or equal to 7yo IM - Cervicovaginal ancillary only  2. Vaginal candidiasis --Wet prep pending  --Will treat for yeast, Terazol 7 Rx sent to pharmacy  Preterm labor symptoms and general obstetric precautions including but not limited to vaginal bleeding, contractions, leaking of fluid and fetal movement were reviewed  in detail with the patient. Please refer to After Visit Summary for other counseling recommendations.  Return in about 2 weeks (around 09/29/2016).   Hurshel Party, CNM

## 2016-09-16 LAB — CERVICOVAGINAL ANCILLARY ONLY
Bacterial vaginitis: NEGATIVE
Candida vaginitis: POSITIVE — AB
TRICH (WINDOWPATH): NEGATIVE

## 2016-09-17 LAB — SPECIMEN STATUS REPORT

## 2016-09-17 LAB — RPR: RPR: NONREACTIVE

## 2016-09-17 LAB — GLUCOSE TOLERANCE, 2 HOURS W/ 1HR
Glucose, 1 hour: 144 mg/dL (ref 65–179)
Glucose, 2 hour: 93 mg/dL (ref 65–152)
Glucose, Fasting: 77 mg/dL (ref 65–91)

## 2016-09-17 LAB — HIV ANTIBODY (ROUTINE TESTING W REFLEX): HIV SCREEN 4TH GENERATION: NONREACTIVE

## 2016-09-29 ENCOUNTER — Ambulatory Visit (INDEPENDENT_AMBULATORY_CARE_PROVIDER_SITE_OTHER): Payer: Medicaid Other | Admitting: Medical

## 2016-09-29 VITALS — BP 119/59 | HR 102 | Wt 186.5 lb

## 2016-09-29 DIAGNOSIS — Z3483 Encounter for supervision of other normal pregnancy, third trimester: Secondary | ICD-10-CM

## 2016-09-29 DIAGNOSIS — O26843 Uterine size-date discrepancy, third trimester: Secondary | ICD-10-CM

## 2016-09-29 DIAGNOSIS — Z3480 Encounter for supervision of other normal pregnancy, unspecified trimester: Secondary | ICD-10-CM

## 2016-09-29 NOTE — Patient Instructions (Signed)
Fetal Movement Counts Patient Name: ________________________________________________ Patient Due Date: ____________________ What is a fetal movement count? A fetal movement count is the number of times that you feel your baby move during a certain amount of time. This may also be called a fetal kick count. A fetal movement count is recommended for every pregnant woman. You may be asked to start counting fetal movements as early as week 28 of your pregnancy. Pay attention to when your baby is most active. You may notice your baby's sleep and wake cycles. You may also notice things that make your baby move more. You should do a fetal movement count:  When your baby is normally most active.  At the same time each day. A good time to count movements is while you are resting, after having something to eat and drink. How do I count fetal movements? 1. Find a quiet, comfortable area. Sit, or lie down on your side. 2. Write down the date, the start time and stop time, and the number of movements that you felt between those two times. Take this information with you to your health care visits. 3. For 2 hours, count kicks, flutters, swishes, rolls, and jabs. You should feel at least 10 movements during 2 hours. 4. You may stop counting after you have felt 10 movements. 5. If you do not feel 10 movements in 2 hours, have something to eat and drink. Then, keep resting and counting for 1 hour. If you feel at least 4 movements during that hour, you may stop counting. Contact a health care provider if:  You feel fewer than 4 movements in 2 hours.  Your baby is not moving like he or she usually does. Date: ____________ Start time: ____________ Stop time: ____________ Movements: ____________ Date: ____________ Start time: ____________ Stop time: ____________ Movements: ____________ Date: ____________ Start time: ____________ Stop time: ____________ Movements: ____________ Date: ____________ Start time:  ____________ Stop time: ____________ Movements: ____________ Date: ____________ Start time: ____________ Stop time: ____________ Movements: ____________ Date: ____________ Start time: ____________ Stop time: ____________ Movements: ____________ Date: ____________ Start time: ____________ Stop time: ____________ Movements: ____________ Date: ____________ Start time: ____________ Stop time: ____________ Movements: ____________ Date: ____________ Start time: ____________ Stop time: ____________ Movements: ____________ This information is not intended to replace advice given to you by your health care provider. Make sure you discuss any questions you have with your health care provider. Document Released: 06/09/2006 Document Revised: 01/07/2016 Document Reviewed: 06/19/2015 Elsevier Interactive Patient Education  2017 [employer] Braxton Hicks Contractions Contractions of the uterus can occur throughout pregnancy, but they are not always a sign that you are in labor. You may have practice contractions called Braxton Hicks contractions. These false labor contractions are sometimes confused with true labor. What are Braxton Hicks contractions? Braxton Hicks contractions are tightening movements that occur in the muscles of the uterus before labor. Unlike true labor contractions, these contractions do not result in opening (dilation) and thinning of the cervix. Toward the end of pregnancy (32-34 weeks), Braxton Hicks contractions can happen more often and may become stronger. These contractions are sometimes difficult to tell apart from true labor because they can be very uncomfortable. You should not feel embarrassed if you go to the hospital with false labor. Sometimes, the only way to tell if you are in true labor is for your health care provider to look for changes in the cervix. The health care provider will do a physical exam and may monitor your contractions. If you   are not in true labor, the exam  should show that your cervix is not dilating and your water has not broken. If there are no prenatal problems or other health problems associated with your pregnancy, it is completely safe for you to be sent home with false labor. You may continue to have Braxton Hicks contractions until you go into true labor. How can I tell the difference between true labor and false labor?  Differences  False labor  Contractions last 30-70 seconds.: Contractions are usually shorter and not as strong as true labor contractions.  Contractions become very regular.: Contractions are usually irregular.  Discomfort is usually felt in the top of the uterus, and it spreads to the lower abdomen and low back.: Contractions are often felt in the front of the lower abdomen and in the groin.  Contractions do not go away with walking.: Contractions may go away when you walk around or change positions while lying down.  Contractions usually become more intense and increase in frequency.: Contractions get weaker and are shorter-lasting as time goes on.  The cervix dilates and gets thinner.: The cervix usually does not dilate or become thin. Follow these instructions at home:  Take over-the-counter and prescription medicines only as told by your health care provider.  Keep up with your usual exercises and follow other instructions from your health care provider.  Eat and drink lightly if you think you are going into labor.  If Braxton Hicks contractions are making you uncomfortable:  Change your position from lying down or resting to walking, or change from walking to resting.  Sit and rest in a tub of warm water.  Drink enough fluid to keep your urine clear or pale yellow. Dehydration may cause these contractions.  Do slow and deep breathing several times an hour.  Keep all follow-up prenatal visits as told by your health care provider. This is important. Contact a health care provider if:  You have a  fever.  You have continuous pain in your abdomen. Get help right away if:  Your contractions become stronger, more regular, and closer together.  You have fluid leaking or gushing from your vagina.  You pass blood-tinged mucus (bloody show).  You have bleeding from your vagina.  You have low back pain that you never had before.  You feel your baby's head pushing down and causing pelvic pressure.  Your baby is not moving inside you as much as it used to. Summary  Contractions that occur before labor are called Braxton Hicks contractions, false labor, or practice contractions.  Braxton Hicks contractions are usually shorter, weaker, farther apart, and less regular than true labor contractions. True labor contractions usually become progressively stronger and regular and they become more frequent.  Manage discomfort from Braxton Hicks contractions by changing position, resting in a warm bath, drinking plenty of water, or practicing deep breathing. This information is not intended to replace advice given to you by your health care provider. Make sure you discuss any questions you have with your health care provider. Document Released: 05/10/2005 Document Revised: 03/29/2016 Document Reviewed: 03/29/2016 Elsevier Interactive Patient Education  2017 Elsevier Inc.  

## 2016-09-29 NOTE — Progress Notes (Signed)
   PRENATAL VISIT NOTE  Subjective:  Paula Gamble is a 21 y.o. G3P0020 at 5873w2d being seen today for ongoing prenatal care.  She is currently monitored for the following issues for this low-risk pregnancy and has Encounter for supervision of normal pregnancy, antepartum on her problem list.  Patient reports no complaints.  Contractions: Irritability. Vag. Bleeding: None.  Movement: Present. Denies leaking of fluid.   The following portions of the patient's history were reviewed and updated as appropriate: allergies, current medications, past family history, past medical history, past social history, past surgical history and problem list. Problem list updated.  Objective:   Vitals:   09/29/16 1523  BP: (!) 119/59  Pulse: (!) 102  Weight: 186 lb 8 oz (84.6 kg)    Fetal Status: Fetal Heart Rate (bpm): 140 Fundal Height: 35 cm Movement: Present     General:  Alert, oriented and cooperative. Patient is in no acute distress.  Skin: Skin is warm and dry. No rash noted.   Cardiovascular: Normal heart rate noted  Respiratory: Normal respiratory effort, no problems with respiration noted  Abdomen: Soft, gravid, appropriate for gestational age. Pain/Pressure: Present     Pelvic:  Cervical exam deferred        Extremities: Normal range of motion.  Edema: None  Mental Status: Normal mood and affect. Normal behavior. Normal judgment and thought content.   Assessment and Plan:  Pregnancy: G3P0020 at 2773w2d  1. Supervision of other normal pregnancy, antepartum - Doing well. No complaints  2. Size of fetus inconsistent with dates in third trimester - US MFM OB FOLLOW UP; scheduled 10/07/16  Preterm labor symptoms and general obstetric precautions including but not limited to vaginal bleeding, contractions, leaking of fluid and fetal movement were reviewed in detail with the patient. Please refer to After Visit Summary for other counseling recommendations.  Return in about 2 weeks  (around 10/13/2016) for LOB.   Marny LowensteinWenzel, Calin Ellery N, PA-C

## 2016-09-29 NOTE — Progress Notes (Signed)
Spanish video interpreter "Rod" [phone]#[id number] used for visit Breastfeeding tip reviewed

## 2016-10-07 ENCOUNTER — Ambulatory Visit (HOSPITAL_COMMUNITY): Admission: RE | Admit: 2016-10-07 | Payer: Medicaid Other | Source: Ambulatory Visit

## 2016-10-08 ENCOUNTER — Ambulatory Visit (HOSPITAL_COMMUNITY)
Admission: RE | Admit: 2016-10-08 | Discharge: 2016-10-08 | Disposition: A | Discharge status: Home / Self Care | Payer: Medicaid Other | Source: Ambulatory Visit | Attending: Medical | Admitting: Medical

## 2016-10-08 ENCOUNTER — Other Ambulatory Visit: Payer: Self-pay | Admitting: Medical

## 2016-10-08 DIAGNOSIS — Z362 Encounter for other antenatal screening follow-up: Secondary | ICD-10-CM

## 2016-10-08 DIAGNOSIS — Z3A32 32 weeks gestation of pregnancy: Secondary | ICD-10-CM | POA: Diagnosis not present

## 2016-10-08 DIAGNOSIS — Z3480 Encounter for supervision of other normal pregnancy, unspecified trimester: Secondary | ICD-10-CM

## 2016-10-08 DIAGNOSIS — O26843 Uterine size-date discrepancy, third trimester: Secondary | ICD-10-CM

## 2016-10-08 DIAGNOSIS — O3663X0 Maternal care for excessive fetal growth, third trimester, not applicable or unspecified: Secondary | ICD-10-CM | POA: Diagnosis not present

## 2016-10-09 ENCOUNTER — Encounter (HOSPITAL_COMMUNITY): Payer: Self-pay | Admitting: *Deleted

## 2016-10-09 ENCOUNTER — Inpatient Hospital Stay (HOSPITAL_COMMUNITY)
Admission: AD | Admit: 2016-10-09 | Discharge: 2016-10-09 | Disposition: A | Discharge status: Home / Self Care | Payer: Medicaid Other | Source: Ambulatory Visit | Attending: Obstetrics and Gynecology | Admitting: Obstetrics and Gynecology

## 2016-10-09 ENCOUNTER — Encounter: Payer: Self-pay | Admitting: Medical

## 2016-10-09 DIAGNOSIS — O4703 False labor before 37 completed weeks of gestation, third trimester: Secondary | ICD-10-CM

## 2016-10-09 DIAGNOSIS — O99613 Diseases of the digestive system complicating pregnancy, third trimester: Secondary | ICD-10-CM | POA: Diagnosis present

## 2016-10-09 DIAGNOSIS — K5900 Constipation, unspecified: Secondary | ICD-10-CM | POA: Diagnosis not present

## 2016-10-09 DIAGNOSIS — Z808 Family history of malignant neoplasm of other organs or systems: Secondary | ICD-10-CM | POA: Insufficient documentation

## 2016-10-09 DIAGNOSIS — Z3A32 32 weeks gestation of pregnancy: Secondary | ICD-10-CM | POA: Insufficient documentation

## 2016-10-09 DIAGNOSIS — Z833 Family history of diabetes mellitus: Secondary | ICD-10-CM | POA: Insufficient documentation

## 2016-10-09 DIAGNOSIS — Z8249 Family history of ischemic heart disease and other diseases of the circulatory system: Secondary | ICD-10-CM | POA: Insufficient documentation

## 2016-10-09 DIAGNOSIS — O99891 Other specified diseases and conditions complicating pregnancy: Secondary | ICD-10-CM

## 2016-10-09 DIAGNOSIS — O9989 Other specified diseases and conditions complicating pregnancy, childbirth and the puerperium: Secondary | ICD-10-CM

## 2016-10-09 DIAGNOSIS — Z888 Allergy status to other drugs, medicaments and biological substances status: Secondary | ICD-10-CM | POA: Diagnosis not present

## 2016-10-09 DIAGNOSIS — O26893 Other specified pregnancy related conditions, third trimester: Secondary | ICD-10-CM | POA: Diagnosis present

## 2016-10-09 DIAGNOSIS — M545 Low back pain: Secondary | ICD-10-CM | POA: Insufficient documentation

## 2016-10-09 DIAGNOSIS — R102 Pelvic and perineal pain: Secondary | ICD-10-CM | POA: Diagnosis not present

## 2016-10-09 DIAGNOSIS — O3660X0 Maternal care for excessive fetal growth, unspecified trimester, not applicable or unspecified: Secondary | ICD-10-CM | POA: Insufficient documentation

## 2016-10-09 DIAGNOSIS — M549 Dorsalgia, unspecified: Secondary | ICD-10-CM

## 2016-10-09 DIAGNOSIS — Z3480 Encounter for supervision of other normal pregnancy, unspecified trimester: Secondary | ICD-10-CM

## 2016-10-09 LAB — URINALYSIS, ROUTINE W REFLEX MICROSCOPIC
Bilirubin Urine: NEGATIVE
GLUCOSE, UA: NEGATIVE mg/dL
Hgb urine dipstick: NEGATIVE
KETONES UR: NEGATIVE mg/dL
Nitrite: NEGATIVE
PH: 6 (ref 5.0–8.0)
Protein, ur: NEGATIVE mg/dL
Specific Gravity, Urine: 1.01 (ref 1.005–1.030)

## 2016-10-09 MED ORDER — NIFEDIPINE 10 MG PO CAPS
10.0000 mg | ORAL_CAPSULE | ORAL | Status: DC | PRN
Start: 2016-10-09 — End: 2016-10-10
  Administered 2016-10-09: 10 mg via ORAL
  Filled 2016-10-09: qty 1

## 2016-10-09 NOTE — MAU Provider Note (Signed)
History     CSN: [id number]  Arrival date and time: 10/09/16 2027   First Provider Initiated Contact with Patient 10/09/16 2102      Chief Complaint  Patient presents with  . Back Pain  . Vaginal Pain   HPI [name] is a 21 y.o. G3P0020 at 3650w5d who presents to MAU today with complaint of abdominal pain and back pain. The patient has LGA baby with last measuements > 90th %tile. She states that she has been having irregular contractions since last night. She states that she had intercourse last night prior to onset of pain. She has also been constipated. She states last BM was yesterday and difficult to pass. She denies N/V, vaginal bleeding, discharge or LOF today. She states 8/10 low back pain and 7/10 lower abdominal pain. She has not taken anything for pain. She also states that she has not been using her pregnancy abdominal binder.   OB History    Gravida Para Term Preterm AB Living   3       2     SAB TAB Ectopic Multiple Live Births   2              Past Medical History:  Diagnosis Date  . Pyelonephritis     Past Surgical History:  Procedure Laterality Date  . DILATION AND CURETTAGE OF UTERUS     x2    Family History  Problem Relation Age of Onset  . Diabetes Paternal Uncle   . Cancer Maternal Grandmother   . Diabetes Maternal Grandmother   . Hypertension Maternal Grandmother   . Cancer Paternal Grandmother        liver    Social History  Substance Use Topics  . Smoking status: Former Games developermoker  . Smokeless tobacco: Never Used  . Alcohol use No     Comment: Last drank in Oct/2017, per pt drank 20 beers     Allergies:  Allergies  Allergen Reactions  . Latex Itching    No prescriptions prior to admission.    Review of Systems  Constitutional: Negative for fever.  Gastrointestinal: Positive for abdominal pain and constipation. Negative for diarrhea, nausea and vomiting.  Genitourinary: Negative for dysuria, frequency, urgency,  vaginal bleeding and vaginal discharge.   Physical Exam   Blood pressure 110/65, pulse 94, temperature 98.4 F (36.9 C), resp. rate 18, height 5\' 4"  (1.626 m), weight 192 lb (87.1 kg), last menstrual period 02/23/2016.  Physical Exam  Nursing note and vitals reviewed. Constitutional: She is oriented to person, place, and time. She appears well-developed and well-nourished. No distress.  HENT:  Head: Normocephalic and atraumatic.  Cardiovascular: Normal rate.   Respiratory: Effort normal.  GI: Soft. She exhibits no distension and no mass. There is tenderness (mild tenderness to palpation of the lower abdomen more prominent on the left side). There is no rebound and no guarding.  Neurological: She is alert and oriented to person, place, and time.  Skin: Skin is warm and dry. No erythema.  Psychiatric: She has a normal mood and affect.  Dilation: Closed Effacement (%): 20 Exam by:: J.Wenzle, PA   Results for orders placed or performed during the hospital encounter of 10/09/16 (from the past 24 hour(s))  Urinalysis, Routine w reflex microscopic     Status: Abnormal   Collection Time: 10/09/16  8:45 PM  Result Value Ref Range   Color, Urine YELLOW YELLOW   APPearance CLEAR CLEAR   Specific Gravity, Urine 1.010 1.005 -  1.030   pH 6.0 5.0 - 8.0   Glucose, UA NEGATIVE NEGATIVE mg/dL   Hgb urine dipstick NEGATIVE NEGATIVE   Bilirubin Urine NEGATIVE NEGATIVE   Ketones, ur NEGATIVE NEGATIVE mg/dL   Protein, ur NEGATIVE NEGATIVE mg/dL   Nitrite NEGATIVE NEGATIVE   Leukocytes, UA TRACE (A) NEGATIVE   RBC / HPF 0-5 0 - 5 RBC/hpf   WBC, UA 6-30 0 - 5 WBC/hpf   Bacteria, UA RARE (A) NONE SEEN   Squamous Epithelial / LPF 0-5 (A) NONE SEEN   Mucous PRESENT     Fetal Monitoring: Baseline: 130 bpm Variability: moderate Accelerations: 15 x 15 Decelerations: none Contractions: irregular  Procardia x 1 Contractions: none  MAU Course  Procedures None  MDM UA today  Unable to  collect FFN due to intercourse last night Procardia given - patient reports improvement in pain. Contractions and UI no longer noted on TOCO.   Assessment and Plan  A: SIUP at [redacted]w[redacted]d Preterm contractions  Back pain in pregnancy, third trimester  LGA  P:  Discharge home Tylenol PRN for pain  Preterm labor precautions discussed Discussed use of abdominal binder for lower abdomen and back pain Patient advised to follow-up with [facility] as scheduled for routine prenatal care  Patient may return to MAU as needed or if her condition were to change or worsen  Vonzella Nipple, PA-C 10/10/2016, 1:53 AM

## 2016-10-09 NOTE — MAU Note (Signed)
Lower back pain and vag pain since this morning. Denies LOF or bleeding. Pain comes and goes

## 2016-10-09 NOTE — Discharge Instructions (Signed)
Contracciones de Designer, multimedia (Braxton Hicks Contractions) Durante el [place], pueden presentarse contracciones uterinas que no siempre indican que est en [place]. QU SON LAS CONTRACCIONES DE BRAXTON HICKS? [place] [employer] se presentan antes del [place] de [place] se conocen como contracciones de [place] o falso trabajo de [place]. Hacia el final del embarazo (32 a 34semanas), estas contracciones pueden aparecen con ms frecuencia y volverse ms intensas. No corresponden al Aleen Campi de parto verdadero porque estas contracciones no producen el agrandamiento (la dilatacin) y el afinamiento del cuello del tero. Algunas veces, es difcil distinguirlas del trabajo de parto verdadero porque en algunos casos pueden ser [employer], y las personas tienen diferentes niveles de tolerancia al [employer] No debe sentirse avergonzada si concurre al hospital con falso trabajo de [place]. En ocasiones, la nica forma de saber si el trabajo de parto es verdadero es que el mdico determine si hay cambios en el cuello del tero. Si no hay problemas prenatales u otras complicaciones de salud asociadas con el embarazo, no habr inconvenientes si la envan a su casa con falso trabajo de parto y espera que comience el verdadero. CMO DIFERENCIAR EL TRABAJO DE PARTO FALSO DEL VERDADERO Falso trabajo de parto  Las contracciones del falso trabajo de parto duran menos y no son tan intensas como las verdaderas.  Generalmente son irregulares.  A menudo, se sienten en la parte delantera de la parte baja del abdomen y en la ingle,  y pueden desaparecer cuando camina o cambia de posicin mientras est acostada.  Las contracciones se vuelven ms dbiles y su duracin es Adult nurse a medida que el tiempo transcurre.  Por lo general, no se hacen progresivamente ms intensas, regulares y Herbalist entre s como en el caso del [place] de parto verdadero. Theodis Blaze de parto  Las contracciones del verdadero  trabajo de parto duran de 30 a 70segundos, son muy regulares y suelen volverse ms intensas, y [place] su frecuencia.  No desaparecen cuando camina.  La molestia generalmente se siente en la parte superior del tero y se extiende hacia la zona inferior del abdomen y [employer] cintura.  El mdico podr examinarla para determinar si el trabajo de parto es verdadero. El examen mostrar si el cuello del tero se est dilatando y [place]. LO QUE DEBE RECORDAR  Contine haciendo los ejercicios habituales y siga otras indicaciones que el mdico le d.  Tome todos los medicamentos como le indic el mdico.  Oceanographer a las visitas prenatales regulares.  Coma y beba con moderacin si cree que est en trabajo de parto.  Si las contracciones de [employer] provocan incomodidad:  Cambie de posicin: si est acostada o descansando, camine; si est caminando, descanse.  Sintese y descanse en una baera con agua tibia.  Beba 2 o 3vasos de [place]. La deshidratacin puede provocar contracciones.  Respire lenta y profundamente varias veces por hora. CUNDO DEBO BUSCAR ASISTENCIA MDICA INMEDIATA? Solicite atencin mdica de inmediato si:  Las contracciones se intensifican, se hacen ms regulares y Arboriculturist s.  Tiene una prdida de lquido por la vagina.  Tiene fiebre.  Elimina mucosidad manchada con [place].  Tiene una hemorragia vaginal abundante.  Tiene dolor abdominal permanente.  Tiene un dolor en la zona lumbar que nunca tuvo antes.  Siente que la cabeza del beb empuja hacia abajo y ejerce presin en la zona plvica.  El beb no se mueve Dentist. Esta informacin no tiene Theme park manager el consejo del  mdico. Asegrese de hacerle al mdico cualquier pregunta que tenga. Document Released: 02/17/2005 Document Revised: 09/01/2015 Document Reviewed: 02/19/2013 Elsevier Interactive Patient Education  2017 [employer].  Dolor de espalda durante el  embarazo (Back Pain in Pregnancy) El dolor de espalda es habitual durante el [place]. Puede deberse a varios factores relacionados con los cambios durante esta etapa. INSTRUCCIONES PARA EL CUIDADO EN EL HOGAR Control del dolor, la rigidez y la hinchazn  Si se lo indican, aplique hielo en caso de dolor de espalda repentino (agudo).  Ponga el hielo en una bolsa plstica.  Coloque una toalla entre la piel y la bolsa de hielo.  Coloque el hielo durante , 2 a 3veces por Futures trader.  Si se lo indican, aplique calor en la zona afectada antes de realizar ejercicios:  Coloque una toalla entre la piel y la compresa de calor o la almohadilla trmica.  Aplique el calor durante 20 a .  Retire la fuente de calor si la piel se le pone de color rojo brillante. Esto es muy importante si no puede sentir el dolor, el calor o el fro. Puede correr un riesgo mayor de sufrir quemaduras. Actividad  Haga ejercicio como se lo haya indicado el mdico. Hacer ejercicio es la mejor forma de Automotive engineer o Human resources officer de espalda.  Prstele atencin a su cuerpo cuando se levante. Si siente dolor al levantarse, pida ayuda o flexione las rodillas. [employer], se usan los msculos de las piernas en lugar de los de la espalda.  Pngase en cuclillas al levantar algo del suelo. No se agache.  Haga reposo en cama nicamente como se lo haya indicado el mdico. El reposo en cama solo debe hacerse cuando los episodios de dolor de espalda son ms intensos. Pararse, sentarse y acostarse  No permanezca sentada o de pie en el mismo lugar durante largos perodos.  Cuando est sentada, adopte una Education officer, museum. Asegrese de que su cabeza descansa sobre sus hombros y no est colgando hacia delante. Use una almohada en la parte inferior de la espalda si es necesario.  Trate de dormir de lado, de preferencia el lado izquierdo, con una o [employer] piernas. Si est dolorida despus de una noche  de descanso, la cama puede ser [employer]. Un colchn duro puede brindarle ms apoyo para la Merchandiser, retail. Instrucciones generales  No use zapatos con tacones altos.  Consumir una dieta saludable. Trate de aumentar de peso dentro de las recomendaciones del mdico.  Use una faja de maternidad, un arns elstico o un cors para la espalda como se lo haya indicado el mdico.  Tome los medicamentos de venta libre y los recetados solamente como se lo haya indicado el mdico.  Oceanographer a todas las visitas de control como se lo haya indicado el mdico. Esto es importante. Incluye las visitas a los especialistas, como un fisioterapeuta. SOLICITE ATENCIN MDICA SI:  El dolor de espalda le impide realizar las actividades cotidianas.  Aumenta el dolor en otras partes del cuerpo. SOLICITE ATENCIN MDICA DE INMEDIATO SI:  Siente entumecimiento, hormigueo, debilidad o problemas con el uso de los brazos o las piernas.  Siente un dolor de espalda intenso que no puede controlar con los medicamentos.  Tiene modificaciones repentinas en el control de la vejiga o el intestino.  Siente que le falta el aire, se marea o se desmaya.  Tiene nuseas, vmitos o sudoracin.  Siente un dolor de espalda que es rtmico y de tipo  clico, similar a las contracciones del [address]. Las contracciones del parto suelen aparecer cada 1 a 2minutos, duran aproximadamente 1minuto y estn acompaadas de una sensacin de empujar o de presin en la pelvis.  Tiene dolor de espalda y rompe la bolsa de las aguas o tiene sangrado vaginal.  El dolor o el adormecimiento se extienden hacia la pierna.  El dolor aparece despus de una cada.  Siente dolor de un solo lado.  Observa sangre en la orina.  Le aparecen ampollas en la piel en la zona del dolor de espalda. Esta informacin no tiene Theme park managercomo fin reemplazar el consejo del mdico. Asegrese de hacerle al mdico cualquier pregunta que tenga. Document  Released: 01/20/2011 Document Revised: 09/01/2015 Document Reviewed: 01/22/2015 Elsevier Interactive Patient Education  2017 [employer]

## 2016-10-12 ENCOUNTER — Encounter (HOSPITAL_COMMUNITY): Payer: Self-pay

## 2016-10-12 ENCOUNTER — Inpatient Hospital Stay (HOSPITAL_COMMUNITY)
Admission: AD | Admit: 2016-10-12 | Discharge: 2016-10-12 | Disposition: A | Discharge status: Home / Self Care | Payer: Medicaid Other | Source: Ambulatory Visit | Attending: Family Medicine | Admitting: Family Medicine

## 2016-10-12 DIAGNOSIS — O99613 Diseases of the digestive system complicating pregnancy, third trimester: Secondary | ICD-10-CM | POA: Insufficient documentation

## 2016-10-12 DIAGNOSIS — Z3A33 33 weeks gestation of pregnancy: Secondary | ICD-10-CM | POA: Insufficient documentation

## 2016-10-12 DIAGNOSIS — Z91040 Latex allergy status: Secondary | ICD-10-CM | POA: Diagnosis not present

## 2016-10-12 DIAGNOSIS — Z87891 Personal history of nicotine dependence: Secondary | ICD-10-CM | POA: Diagnosis not present

## 2016-10-12 DIAGNOSIS — M545 Low back pain: Secondary | ICD-10-CM | POA: Diagnosis not present

## 2016-10-12 DIAGNOSIS — K5900 Constipation, unspecified: Secondary | ICD-10-CM | POA: Insufficient documentation

## 2016-10-12 DIAGNOSIS — O26899 Other specified pregnancy related conditions, unspecified trimester: Secondary | ICD-10-CM

## 2016-10-12 DIAGNOSIS — R1030 Lower abdominal pain, unspecified: Secondary | ICD-10-CM | POA: Diagnosis not present

## 2016-10-12 DIAGNOSIS — O26893 Other specified pregnancy related conditions, third trimester: Secondary | ICD-10-CM | POA: Insufficient documentation

## 2016-10-12 DIAGNOSIS — R109 Unspecified abdominal pain: Secondary | ICD-10-CM | POA: Diagnosis not present

## 2016-10-12 DIAGNOSIS — N898 Other specified noninflammatory disorders of vagina: Secondary | ICD-10-CM | POA: Diagnosis not present

## 2016-10-12 LAB — URINALYSIS, ROUTINE W REFLEX MICROSCOPIC
Bilirubin Urine: NEGATIVE
GLUCOSE, UA: NEGATIVE mg/dL
HGB URINE DIPSTICK: NEGATIVE
Ketones, ur: NEGATIVE mg/dL
NITRITE: NEGATIVE
Protein, ur: NEGATIVE mg/dL
SPECIFIC GRAVITY, URINE: 1.003 — AB (ref 1.005–1.030)
pH: 7 (ref 5.0–8.0)

## 2016-10-12 LAB — POCT FERN TEST: POCT Fern Test: NEGATIVE

## 2016-10-12 MED ORDER — DOCUSATE SODIUM 100 MG PO CAPS: 100.0000 mg | ORAL_CAPSULE | Freq: Two times a day (BID) | ORAL | 0 refills | Status: DC

## 2016-10-12 NOTE — MAU Provider Note (Signed)
History     CSN: [id number]  Arrival date and time: 10/12/16 0909  First Provider Initiated Contact with Patient 10/12/16 [phone]      Chief Complaint  Patient presents with  . Contractions  . possible rupture of membranes   HPI Valine Drozdowski is a 21 y.o. G3P0020 at [redacted]w[redacted]d who presents with abdominal pain & leaking of fluid. Symptoms began this morning around 7 am. Patient reports constant lower back pain & intermittent lower abdominal pain. Pain occurs every 5 minutes. Rates pain 9/10. Has not treated. Since this morning has seen watery white fluid coming out consistently.  Recently in MAU for constipation. Last BM was yesterday; hard small amount. Used 1 dose of miralax 2 days ago; otherwise, it not treating constipation.  Denies vaginal bleeding, n/v, dysuria. Positive fetal movement.   Spanish phone interpreter used for visit.   OB History    Gravida Para Term Preterm AB Living   3       2     SAB TAB Ectopic Multiple Live Births   2              Past Medical History:  Diagnosis Date  . Pyelonephritis     Past Surgical History:  Procedure Laterality Date  . DILATION AND CURETTAGE OF UTERUS     x2    Family History  Problem Relation Age of Onset  . Diabetes Paternal Uncle   . Cancer Maternal Grandmother   . Diabetes Maternal Grandmother   . Hypertension Maternal Grandmother   . Cancer Paternal Grandmother        liver    Social History  Substance Use Topics  . Smoking status: Former Games developer  . Smokeless tobacco: Never Used  . Alcohol use No     Comment: Last drank in Oct/2017, per pt drank 20 beers     Allergies:  Allergies  Allergen Reactions  . Latex Itching    Prescriptions Prior to Admission  Medication Sig Dispense Refill Last Dose  . acetaminophen (TYLENOL) 500 MG tablet Take 500 mg by mouth every 6 (six) hours as needed.   Not Taking  . bisacodyl (DULCOLAX) 5 MG EC tablet Take 1 tablet (5 mg total) by mouth daily as needed for mild  constipation or moderate constipation. (Patient not taking: Reported on 09/29/2016) 30 tablet 0 Not Taking  . Elastic Bandages & Supports (COMFORT FIT MATERNITY SUPP MED) MISC 1 Device by Does not apply route daily as needed. (Patient not taking: Reported on 08/18/2016) 1 each 0 Not Taking  . polyethylene glycol (MIRALAX / GLYCOLAX) packet Take 17 g by mouth daily as needed for mild constipation. (Patient not taking: Reported on 09/15/2016) 17 each 0 Not Taking  . Prenat w/o A Vit-FeFum-FePo-FA (CONCEPT OB) 130-92.4-1 MG CAPS Take 1 capsule by mouth daily.   Taking    Review of Systems  Constitutional: Negative.   Gastrointestinal: Positive for abdominal pain and constipation. Negative for blood in stool, diarrhea, nausea, rectal pain and vomiting.  Genitourinary: Positive for vaginal discharge. Negative for dysuria and vaginal bleeding.  Musculoskeletal: Positive for back pain.   Physical Exam   Blood pressure (!) 91/52, pulse 90, temperature 98.2 F (36.8 C), temperature source Oral, resp. rate 18, last menstrual period 02/23/2016.  Physical Exam  Nursing note and vitals reviewed. Constitutional: She is oriented to person, place, and time. She appears well-developed and well-nourished. No distress.  HENT:  Head: Normocephalic and atraumatic.  Eyes: Conjunctivae are normal. Right eye exhibits  no discharge. Left eye exhibits no discharge. No scleral icterus.  Neck: Normal range of motion.  Cardiovascular: Normal rate, regular rhythm and normal heart sounds.   No murmur heard. Respiratory: Effort normal and breath sounds normal. No respiratory distress. She has no wheezes.  GI: Soft.  Abdomen soft & nontender. No ctx palpated  Genitourinary: No bleeding in the vagina. Vaginal discharge (small amount of thick white discharge; no pooling) found.  Neurological: She is alert and oriented to person, place, and time.  Skin: Skin is warm and dry. She is not diaphoretic.  Psychiatric: She has a  normal mood and affect. Her behavior is normal. Judgment and thought content normal.   Dilation: Closed Effacement (%): Thick Cervical Position: Posterior Exam by:: Judeth HornErin Owyn Raulston NP  Fetal Tracing:  Baseline: 125 Variability: moderate Accelerations: 15x15 Decelerations: none  Toco: x1, 50 sec MAU Course  Procedures Results for orders placed or performed during the hospital encounter of 10/12/16 (from the past 24 hour(s))  Urinalysis, Routine w reflex microscopic     Status: Abnormal   Collection Time: 10/12/16  9:11 AM  Result Value Ref Range   Color, Urine STRAW (A) YELLOW   APPearance CLEAR CLEAR   Specific Gravity, Urine 1.003 (L) 1.005 - 1.030   pH 7.0 5.0 - 8.0   Glucose, UA NEGATIVE NEGATIVE mg/dL   Hgb urine dipstick NEGATIVE NEGATIVE   Bilirubin Urine NEGATIVE NEGATIVE   Ketones, ur NEGATIVE NEGATIVE mg/dL   Protein, ur NEGATIVE NEGATIVE mg/dL   Nitrite NEGATIVE NEGATIVE   Leukocytes, UA TRACE (A) NEGATIVE   RBC / HPF 0-5 0 - 5 RBC/hpf   WBC, UA 6-30 0 - 5 WBC/hpf   Bacteria, UA RARE (A) NONE SEEN   Squamous Epithelial / LPF 0-5 (A) NONE SEEN   Mucous PRESENT   POCT fern test     Status: None   Collection Time: 10/12/16 11:03 AM  Result Value Ref Range   POCT Fern Test Negative = intact amniotic membranes     MDM Reactive fetal tracing No ctx palpated or appreciated on TOCO SVE closed/thick/high NAD, VSS No pooling, fern negative Assessment and Plan  A: 1. Abdominal pain affecting pregnancy   2. Vaginal discharge during pregnancy in third trimester   3. Constipation during pregnancy in third trimester    P: Discharge home Rx colace Take miralax as previously prescribed Increase water intake Keep ob appointment on Thursday Discussed reasons to return to MAU  Judeth HornErin Atlee Kluth 10/12/2016, 9:51 AM

## 2016-10-12 NOTE — MAU Note (Signed)
Pt complaining of abdominal pain that comes and goes and constant back pain. Has felt like she has leaking fluid since 0600. Pain is 10/10

## 2016-10-12 NOTE — Discharge Instructions (Signed)
Constipacin en los adultos (Constipation, Adult) Constipacin significa que una persona defeca en una semana menos que lo normal, hay dificultad para defecar, o las heces son secas, duras, o ms grandes que lo normal. La causa puede ser una afeccin subyacente. Puede empeorar con la edad si una persona toma ciertos medicamentos y no toma suficiente lquido. INSTRUCCIONES PARA EL CUIDADO EN EL HOGAR Comida y bebida  Consuma alimentos con alto contenido de fibra, como frutas y verduras frescas, cereales integrales y frijoles.  Limite los alimentos ricos en grasas y con bajo contenido de fibra, o muy procesados, como las papas fritas, hamburguesas, galletas, dulces y refrescos.  Beba suficiente lquido para mantener la orina clara o de color amarillo plido. Instrucciones generales  Haga actividad fsica habitualmente o como se lo haya indicado el mdico.  Vaya al bao cuando sienta la necesidad de ir. No se aguante las ganas.  Tome los medicamentos de venta libre y los recetados solamente como se lo haya indicado el mdico. Estos incluyen los suplementos de fibra.  Practique ejercicios de rehabilitacin del suelo plvico, como la respiracin profunda mientras relaja la parte inferior del abdomen y relajacin del suelo plvico mientras defeca.  Controle su afeccin para ver si hay cambios.  Concurra a todas las visitas de control como se lo haya indicado el mdico. Esto es importante. SOLICITE ATENCIN MDICA SI:  Siente un dolor que empeora.  Tiene fiebre.  No defeca despus de 4das.  Vomita.  No tiene hambre.  Pierde peso.  Tiene una hemorragia en el ano.  Las heces son delgadas como un lpiz. SOLICITE ATENCIN MDICA DE INMEDIATO SI:  Tiene fiebre y los sntomas empeoran repentinamente.  Observa que se filtran heces o hay sangre en las heces.  Tiene el abdomen hinchado.  Siente un dolor intenso en el abdomen.  Se siente mareado o se desmaya. Esta informacin no  tiene como fin reemplazar el consejo del mdico. Asegrese de hacerle al mdico cualquier pregunta que tenga. Document Released: 05/30/2007 Document Revised: 05/31/2014 Document Reviewed: 10/29/2015 Elsevier Interactive Patient Education  2017 Elsevier Inc.  

## 2016-10-14 ENCOUNTER — Ambulatory Visit (INDEPENDENT_AMBULATORY_CARE_PROVIDER_SITE_OTHER): Payer: Medicaid Other | Admitting: Advanced Practice Midwife

## 2016-10-14 ENCOUNTER — Encounter: Payer: Self-pay | Admitting: Advanced Practice Midwife

## 2016-10-14 VITALS — BP 119/63 | HR 96 | Wt 191.9 lb

## 2016-10-14 DIAGNOSIS — Z3483 Encounter for supervision of other normal pregnancy, third trimester: Secondary | ICD-10-CM

## 2016-10-14 DIAGNOSIS — Z3480 Encounter for supervision of other normal pregnancy, unspecified trimester: Secondary | ICD-10-CM

## 2016-10-14 DIAGNOSIS — O26843 Uterine size-date discrepancy, third trimester: Secondary | ICD-10-CM

## 2016-10-14 NOTE — Progress Notes (Signed)
   PRENATAL VISIT NOTE  Subjective:  Paula Gamble is a 21 y.o. G3P0020 at 655w3d being seen today for ongoing prenatal care.  She is currently monitored for the following issues for this low-risk pregnancy and has Encounter for supervision of normal pregnancy, antepartum and Uterine size date discrepancy pregnancy, third trimester on her problem list.  Patient reports occasional contractions.  Contractions: Irregular. Vag. Bleeding: None.  Movement: Present. Denies leaking of fluid.   The following portions of the patient's history were reviewed and updated as appropriate: allergies, current medications, past family history, past medical history, past social history, past surgical history and problem list. Problem list updated.  Objective:   Vitals:   10/14/16 1535  BP: 119/63  Pulse: 96  Weight: 191 lb 14.4 oz (87 kg)    Fetal Status: Fetal Heart Rate (bpm): 135 Fundal Height: 35 cm Movement: Present  Presentation: Vertex  General:  Alert, oriented and cooperative. Patient is in no acute distress.  Skin: Skin is warm and dry. No rash noted.   Cardiovascular: Normal heart rate noted  Respiratory: Normal respiratory effort, no problems with respiration noted  Abdomen: Soft, gravid, appropriate for gestational age. Pain/Pressure: Present     Pelvic:  Cervical exam deferred        Extremities: Normal range of motion.  Edema: None  Mental Status: Normal mood and affect. Normal behavior. Normal judgment and thought content.   Assessment and Plan:  Pregnancy: G3P0020 at 3255w3d  1. Supervision of other normal pregnancy, antepartum   2. Uterine size date discrepancy pregnancy, third trimester - Watch labor curve  Preterm labor symptoms and general obstetric precautions including but not limited to vaginal bleeding, contractions, leaking of fluid and fetal movement were reviewed in detail with the patient. Please refer to After Visit Summary for other counseling recommendations.   Return in about 2 weeks (around 10/28/2016) for ROB.   Dorathy KinsmanVirginia Anakaren Campion, CNM

## 2016-10-14 NOTE — Progress Notes (Signed)
Spanish video interpreter "Dorene Grebeatalie" [phone] used for visit

## 2016-10-14 NOTE — Patient Instructions (Signed)
Contracciones de Braxton Hicks °(Braxton Hicks Contractions) °Durante el embarazo, pueden presentarse contracciones uterinas que no siempre indican que está en trabajo de parto. °¿QUÉ SON LAS CONTRACCIONES DE BRAXTON HICKS? °Las contracciones que se presentan antes del trabajo de parto se conocen como contracciones de Braxton Hicks o falso trabajo de parto. Hacia el final del embarazo (32 a 34 semanas), estas contracciones pueden aparecen con más frecuencia y volverse más intensas. No corresponden al trabajo de parto verdadero porque estas contracciones no producen el agrandamiento (la dilatación) y el afinamiento del cuello del útero. Algunas veces, es difícil distinguirlas del trabajo de parto verdadero porque en algunos casos pueden ser muy intensas, y las personas tienen diferentes niveles de tolerancia al dolor. No debe sentirse avergonzada si concurre al hospital con falso trabajo de parto. En ocasiones, la única forma de saber si el trabajo de parto es verdadero es que el médico determine si hay cambios en el cuello del útero. °Si no hay problemas prenatales u otras complicaciones de salud asociadas con el embarazo, no habrá inconvenientes si la envían a su casa con falso trabajo de parto y espera que comience el verdadero. °CÓMO DIFERENCIAR EL TRABAJO DE PARTO FALSO DEL VERDADERO °Falso trabajo de parto  °· Las contracciones del falso trabajo de parto duran menos y no son tan intensas como las verdaderas. °· Generalmente son irregulares. °· A menudo, se sienten en la parte delantera de la parte baja del abdomen y en la ingle, °· y pueden desaparecer cuando camina o cambia de posición mientras está acostada. °· Las contracciones se vuelven más débiles y su duración es menor a medida que el tiempo transcurre. °· Por lo general, no se hacen progresivamente más intensas, regulares y cercanas entre sí como en el caso del trabajo de parto verdadero. °Verdadero trabajo de parto  °· Las contracciones del verdadero  trabajo de parto duran de 30 a 70 segundos, son muy regulares y suelen volverse más intensas, y aumenta su frecuencia. °· No desaparecen cuando camina. °· La molestia generalmente se siente en la parte superior del útero y se extiende hacia la zona inferior del abdomen y hacia la cintura. °· El médico podrá examinarla para determinar si el trabajo de parto es verdadero. El examen mostrará si el cuello del útero se está dilatando y afinando. °LO QUE DEBE RECORDAR °· Continúe haciendo los ejercicios habituales y siga otras indicaciones que el médico le dé. °· Tome todos los medicamentos como le indicó el médico. °· Concurra a las visitas prenatales regulares. °· Coma y beba con moderación si cree que está en trabajo de parto. °· Si las contracciones de Braxton Hicks le provocan incomodidad: °¨ Cambie de posición: si está acostada o descansando, camine; si está caminando, descanse. °¨ Siéntese y descanse en una bañera con agua tibia. °¨ Beba 2 o 3 vasos de agua. La deshidratación puede provocar contracciones. °¨ Respire lenta y profundamente varias veces por hora. °¿CUÁNDO DEBO BUSCAR ASISTENCIA MÉDICA INMEDIATA? °Solicite atención médica de inmediato si: °· Las contracciones se intensifican, se hacen más regulares y cercanas entre sí. °· Tiene una pérdida de líquido por la vagina. °· Tiene fiebre. °· Elimina mucosidad manchada con sangre. °· Tiene una hemorragia vaginal abundante. °· Tiene dolor abdominal permanente. °· Tiene un dolor en la zona lumbar que nunca tuvo antes. °· Siente que la cabeza del bebé empuja hacia abajo y ejerce presión en la zona pélvica. °· El bebé no se mueve tanto como solía. °Esta información no tiene como fin reemplazar el   consejo del médico. Asegúrese de hacerle al médico cualquier pregunta que tenga. °Document Released: 02/17/2005 Document Revised: 09/01/2015 Document Reviewed: 02/19/2013 °Elsevier Interactive Patient Education © 2017 Elsevier Inc. ° °

## 2016-10-21 ENCOUNTER — Inpatient Hospital Stay (HOSPITAL_COMMUNITY)
Admission: AD | Admit: 2016-10-21 | Discharge: 2016-10-21 | Disposition: A | Discharge status: Home / Self Care | Payer: Medicaid Other | Source: Ambulatory Visit | Attending: Obstetrics and Gynecology | Admitting: Obstetrics and Gynecology

## 2016-10-21 ENCOUNTER — Encounter (HOSPITAL_COMMUNITY): Payer: Self-pay

## 2016-10-21 DIAGNOSIS — Z3A34 34 weeks gestation of pregnancy: Secondary | ICD-10-CM | POA: Insufficient documentation

## 2016-10-21 DIAGNOSIS — Z87891 Personal history of nicotine dependence: Secondary | ICD-10-CM | POA: Diagnosis not present

## 2016-10-21 DIAGNOSIS — R102 Pelvic and perineal pain: Secondary | ICD-10-CM | POA: Insufficient documentation

## 2016-10-21 DIAGNOSIS — N898 Other specified noninflammatory disorders of vagina: Secondary | ICD-10-CM | POA: Insufficient documentation

## 2016-10-21 DIAGNOSIS — R109 Unspecified abdominal pain: Secondary | ICD-10-CM | POA: Diagnosis not present

## 2016-10-21 DIAGNOSIS — O26893 Other specified pregnancy related conditions, third trimester: Secondary | ICD-10-CM | POA: Diagnosis not present

## 2016-10-21 LAB — URINALYSIS, ROUTINE W REFLEX MICROSCOPIC
Bilirubin Urine: NEGATIVE
GLUCOSE, UA: NEGATIVE mg/dL
Hgb urine dipstick: NEGATIVE
Ketones, ur: NEGATIVE mg/dL
Nitrite: NEGATIVE
Protein, ur: NEGATIVE mg/dL
Specific Gravity, Urine: 1.005 (ref 1.005–1.030)
pH: 7 (ref 5.0–8.0)

## 2016-10-21 LAB — WET PREP, GENITAL
CLUE CELLS WET PREP: NONE SEEN
SPERM: NONE SEEN
TRICH WET PREP: NONE SEEN
Yeast Wet Prep HPF POC: NONE SEEN

## 2016-10-21 NOTE — MAU Provider Note (Signed)
History     CSN: [id number]  Arrival date and time: 10/21/16 0005   First Provider Initiated Contact with Patient 10/21/16 [phone]      Chief Complaint  Patient presents with  . Pelvic Pain   Rayya Yagi is a 21 y.o. G3P0020 at [redacted]w[redacted]d who presents today with abdominal and pelvic pain. She states that it started about 2 hours PTA. She also reports leaking of fluid. She reports normal fetal movement.    Pelvic Pain  The patient's primary symptoms include pelvic pain and vaginal discharge. This is a new problem. The current episode started today. The problem occurs intermittently. The problem has been unchanged. Pain severity now: 10/10. The problem affects both sides. Associated symptoms include nausea. Pertinent negatives include no chills, dysuria, fever, frequency, urgency or vomiting. The vaginal discharge was watery. There has been no bleeding. Nothing aggravates the symptoms. She has tried nothing for the symptoms. Sexual activity: No intercourse in the last 24 hours.      Past Medical History:  Diagnosis Date  . Pyelonephritis     Past Surgical History:  Procedure Laterality Date  . DILATION AND CURETTAGE OF UTERUS     x2    Family History  Problem Relation Age of Onset  . Diabetes Paternal Uncle   . Cancer Maternal Grandmother   . Diabetes Maternal Grandmother   . Hypertension Maternal Grandmother   . Cancer Paternal Grandmother        liver    Social History  Substance Use Topics  . Smoking status: Former Games developer  . Smokeless tobacco: Never Used  . Alcohol use No     Comment: Last drank in Oct/2017, per pt drank 20 beers     Allergies:  Allergies  Allergen Reactions  . Latex Itching    Prescriptions Prior to Admission  Medication Sig Dispense Refill Last Dose  . acetaminophen (TYLENOL) 500 MG tablet Take 500 mg by mouth every 6 (six) hours as needed.   Taking  . docusate sodium (COLACE) 100 MG capsule Take 1 capsule (100 mg total) by mouth 2  (two) times daily. 60 capsule 0 Taking  . Elastic Bandages & Supports (COMFORT FIT MATERNITY SUPP MED) MISC 1 Device by Does not apply route daily as needed. 1 each 0 Taking  . polyethylene glycol (MIRALAX / GLYCOLAX) packet Take 17 g by mouth daily as needed for mild constipation. 17 each 0 Taking  . Prenat w/o A Vit-FeFum-FePo-FA (CONCEPT OB) 130-92.4-1 MG CAPS Take 1 capsule by mouth daily.   Taking    Review of Systems  Constitutional: Negative for chills and fever.  Gastrointestinal: Positive for nausea. Negative for vomiting.  Genitourinary: Positive for pelvic pain and vaginal discharge. Negative for dysuria, frequency and urgency.   Physical Exam   Last menstrual period 02/23/2016.  Physical Exam  Nursing note and vitals reviewed. Constitutional: She is oriented to person, place, and time. She appears well-developed and well-nourished. No distress.  HENT:  Head: Normocephalic.  Cardiovascular: Normal rate.   Respiratory: Effort normal.  GI: Soft. There is no tenderness. There is no rebound.  Genitourinary:  Genitourinary Comments:  External: no lesion Vagina: small amount of white discharge Cervix: pink, smooth, bleeding on contact with the q-tip Uterus: AGA   Neurological: She is alert and oriented to person, place, and time.  Skin: Skin is warm and dry.  Psychiatric: She has a normal mood and affect.   FHT: 135, moderate with 15x15 accels, no decels Toco: no UCs  Results for orders placed or performed during the hospital encounter of 10/21/16 (from the past 24 hour(s))  Urinalysis, Routine w reflex microscopic     Status: Abnormal   Collection Time: 10/21/16 12:23 AM  Result Value Ref Range   Color, Urine STRAW (A) YELLOW   APPearance CLEAR CLEAR   Specific Gravity, Urine 1.005 1.005 - 1.030   pH 7.0 5.0 - 8.0   Glucose, UA NEGATIVE NEGATIVE mg/dL   Hgb urine dipstick NEGATIVE NEGATIVE   Bilirubin Urine NEGATIVE NEGATIVE   Ketones, ur NEGATIVE NEGATIVE mg/dL    Protein, ur NEGATIVE NEGATIVE mg/dL   Nitrite NEGATIVE NEGATIVE   Leukocytes, UA SMALL (A) NEGATIVE   RBC / HPF 0-5 0 - 5 RBC/hpf   WBC, UA 6-30 0 - 5 WBC/hpf   Bacteria, UA MANY (A) NONE SEEN   Squamous Epithelial / LPF 6-30 (A) NONE SEEN   Mucous PRESENT   Wet prep, genital     Status: Abnormal   Collection Time: 10/21/16 12:47 AM  Result Value Ref Range   Yeast Wet Prep HPF POC NONE SEEN NONE SEEN   Trich, Wet Prep NONE SEEN NONE SEEN   Clue Cells Wet Prep HPF POC NONE SEEN NONE SEEN   WBC, Wet Prep HPF POC FEW (A) NONE SEEN   Sperm NONE SEEN     MAU Course  Procedures  MDM   Assessment and Plan   1. Abdominal pain in pregnancy, third trimester   2. [redacted] weeks gestation of pregnancy    Patient here with abdominal pain. This has been an ongoing issue for her for this pregnancy. Cervix is closed, with some contact bleeding, but otherwise normal Reactive tracing. Patient reassured Will DC home, and FU with the clinic as planned  DC home Comfort measures reviewed  3rd Trimester precautions  PTL precautions  Fetal kick counts RX: none  Return to MAU as needed FU with OB as planned  Follow-up Information    [facility].   Specialty:  Obstetrics and Gynecology Contact information: [address] [place] [id number] [phone]           Thressa ShellerHeather Sherline Eberwein 10/21/2016, 12:37 AM

## 2016-10-21 NOTE — MAU Note (Signed)
Pt reports vaginal bleeding and pain tonight. ? LOF. Reports good fetal movement. Has not taken anything for pain

## 2016-10-21 NOTE — Discharge Instructions (Signed)
Third Trimester of Pregnancy The third trimester is from week 28 through week 40 (months 7 through 9). The third trimester is a time when the unborn baby (fetus) is growing rapidly. At the end of the ninth month, the fetus is about 20 inches in length and weighs 6-10 pounds. Body changes during your third trimester Your body will continue to go through many changes during pregnancy. The changes vary from woman to woman. During the third trimester:  Your weight will continue to increase. You can expect to gain 25-35 pounds (11-16 kg) by the end of the pregnancy.  You may begin to get stretch marks on your hips, abdomen, and breasts.  You may urinate more often because the fetus is moving lower into your pelvis and pressing on your bladder.  You may develop or continue to have heartburn. This is caused by increased hormones that slow down muscles in the digestive tract.  You may develop or continue to have constipation because increased hormones slow digestion and cause the muscles that push waste through your intestines to relax.  You may develop hemorrhoids. These are swollen veins (varicose veins) in the rectum that can itch or be painful.  You may develop swollen, bulging veins (varicose veins) in your legs.  You may have increased body aches in the pelvis, back, or thighs. This is due to weight gain and increased hormones that are relaxing your joints.  You may have changes in your hair. These can include thickening of your hair, rapid growth, and changes in texture. Some women also have hair loss during or after pregnancy, or hair that feels dry or thin. Your hair will most likely return to normal after your baby is born.  Your breasts will continue to grow and they will continue to become tender. A yellow fluid (colostrum) may leak from your breasts. This is the first milk you are producing for your baby.  Your belly button may stick out.  You may notice more swelling in your hands,  face, or ankles.  You may have increased tingling or numbness in your hands, arms, and legs. The skin on your belly may also feel numb.  You may feel short of breath because of your expanding uterus.  You may have more problems sleeping. This can be caused by the size of your belly, increased need to urinate, and an increase in your body's metabolism.  You may notice the fetus "dropping," or moving lower in your abdomen (lightening).  You may have increased vaginal discharge.  You may notice your joints feel loose and you may have pain around your pelvic bone.  What to expect at prenatal visits You will have prenatal exams every 2 weeks until week 36. Then you will have weekly prenatal exams. During a routine prenatal visit:  You will be weighed to make sure you and the baby are growing normally.  Your blood pressure will be taken.  Your abdomen will be measured to track your baby's growth.  The fetal heartbeat will be listened to.  Any test results from the previous visit will be discussed.  You may have a cervical check near your due date to see if your cervix has softened or thinned (effaced).  You will be tested for Group B streptococcus. This happens between 35 and 37 weeks.  Your health care provider may ask you:  What your birth plan is.  How you are feeling.  If you are feeling the baby move.  If you have had   any abnormal symptoms, such as leaking fluid, bleeding, severe headaches, or abdominal cramping.  If you are using any tobacco products, including cigarettes, chewing tobacco, and electronic cigarettes.  If you have any questions.  Other tests or screenings that may be performed during your third trimester include:  Blood tests that check for low iron levels (anemia).  Fetal testing to check the health, activity level, and growth of the fetus. Testing is done if you have certain medical conditions or if there are problems during the  pregnancy.  Nonstress test (NST). This test checks the health of your baby to make sure there are no signs of problems, such as the baby not getting enough oxygen. During this test, a belt is placed around your belly. The baby is made to move, and its heart rate is monitored during movement.  What is false labor? False labor is a condition in which you feel small, irregular tightenings of the muscles in the womb (contractions) that usually go away with rest, changing position, or drinking water. These are called Braxton Hicks contractions. Contractions may last for hours, days, or even weeks before true labor sets in. If contractions come at regular intervals, become more frequent, increase in intensity, or become painful, you should see your health care provider. What are the signs of labor?  Abdominal cramps.  Regular contractions that start at 10 minutes apart and become stronger and more frequent with time.  Contractions that start on the top of the uterus and spread down to the lower abdomen and back.  Increased pelvic pressure and dull back pain.  A watery or bloody mucus discharge that comes from the vagina.  Leaking of amniotic fluid. This is also known as your "water breaking." It could be a slow trickle or a gush. Let your health care provider know if it has a color or strange odor. If you have any of these signs, call your health care provider right away, even if it is before your due date. Follow these instructions at home: Medicines  Follow your health care provider's instructions regarding medicine use. Specific medicines may be either safe or unsafe to take during pregnancy.  Take a prenatal vitamin that contains at least 600 micrograms (mcg) of folic acid.  If you develop constipation, try taking a stool softener if your health care provider approves. Eating and drinking  Eat a balanced diet that includes fresh fruits and vegetables, whole grains, good sources of protein  such as meat, eggs, or tofu, and low-fat dairy. Your health care provider will help you determine the amount of weight gain that is right for you.  Avoid raw meat and uncooked cheese. These carry germs that can cause birth defects in the baby.  If you have low calcium intake from food, talk to your health care provider about whether you should take a daily calcium supplement.  Eat four or five small meals rather than three large meals a day.  Limit foods that are high in fat and processed sugars, such as fried and sweet foods.  To prevent constipation: ? Drink enough fluid to keep your urine clear or pale yellow. ? Eat foods that are high in fiber, such as fresh fruits and vegetables, whole grains, and beans. Activity  Exercise only as directed by your health care provider. Most women can continue their usual exercise routine during pregnancy. Try to exercise for 30 minutes at least 5 days a week. Stop exercising if you experience uterine contractions.  Avoid heavy   lifting.  Do not exercise in extreme heat or humidity, or at high altitudes.  Wear low-heel, comfortable shoes.  Practice good posture.  You may continue to have sex unless your health care provider tells you otherwise. Relieving pain and discomfort  Take frequent breaks and rest with your legs elevated if you have leg cramps or low back pain.  Take warm sitz baths to soothe any pain or discomfort caused by hemorrhoids. Use hemorrhoid cream if your health care provider approves.  Wear a good support bra to prevent discomfort from breast tenderness.  If you develop varicose veins: ? Wear support pantyhose or compression stockings as told by your healthcare provider. ? Elevate your feet for 15 minutes, 3-4 times a day. Prenatal care  Write down your questions. Take them to your prenatal visits.  Keep all your prenatal visits as told by your health care provider. This is important. Safety  Wear your seat belt at  all times when driving.  Make a list of emergency phone numbers, including numbers for family, friends, the hospital, and police and fire departments. General instructions  Avoid cat litter boxes and soil used by cats. These carry germs that can cause birth defects in the baby. If you have a cat, ask someone to clean the litter box for you.  Do not travel far distances unless it is absolutely necessary and only with the approval of your health care provider.  Do not use hot tubs, steam rooms, or saunas.  Do not drink alcohol.  Do not use any products that contain nicotine or tobacco, such as cigarettes and e-cigarettes. If you need help quitting, ask your health care provider.  Do not use any medicinal herbs or unprescribed drugs. These chemicals affect the formation and growth of the baby.  Do not douche or use tampons or scented sanitary pads.  Do not cross your legs for long periods of time.  To prepare for the arrival of your baby: ? Take prenatal classes to understand, practice, and ask questions about labor and delivery. ? Make a trial run to the hospital. ? Visit the hospital and tour the maternity area. ? Arrange for maternity or paternity leave through employers. ? Arrange for family and friends to take care of pets while you are in the hospital. ? Purchase a rear-facing car seat and make sure you know how to install it in your car. ? Pack your hospital bag. ? Prepare the baby's nursery. Make sure to remove all pillows and stuffed animals from the baby's crib to prevent suffocation.  Visit your dentist if you have not gone during your pregnancy. Use a soft toothbrush to brush your teeth and be gentle when you floss. Contact a health care provider if:  You are unsure if you are in labor or if your water has broken.  You become dizzy.  You have mild pelvic cramps, pelvic pressure, or nagging pain in your abdominal area.  You have lower back pain.  You have persistent  nausea, vomiting, or diarrhea.  You have an unusual or bad smelling vaginal discharge.  You have pain when you urinate. Get help right away if:  Your water breaks before 37 weeks.  You have regular contractions less than 5 minutes apart before 37 weeks.  You have a fever.  You are leaking fluid from your vagina.  You have spotting or bleeding from your vagina.  You have severe abdominal pain or cramping.  You have rapid weight loss or weight gain.    You have shortness of breath with chest pain.  You notice sudden or extreme swelling of your face, hands, ankles, feet, or legs.  Your baby makes fewer than 10 movements in 2 hours.  You have severe headaches that do not go away when you take medicine.  You have vision changes. Summary  The third trimester is from week 28 through week 40, months 7 through 9. The third trimester is a time when the unborn baby (fetus) is growing rapidly.  During the third trimester, your discomfort may increase as you and your baby continue to gain weight. You may have abdominal, leg, and back pain, sleeping problems, and an increased need to urinate.  During the third trimester your breasts will keep growing and they will continue to become tender. A yellow fluid (colostrum) may leak from your breasts. This is the first milk you are producing for your baby.  False labor is a condition in which you feel small, irregular tightenings of the muscles in the womb (contractions) that eventually go away. These are called Braxton Hicks contractions. Contractions may last for hours, days, or even weeks before true labor sets in.  Signs of labor can include: abdominal cramps; regular contractions that start at 10 minutes apart and become stronger and more frequent with time; watery or bloody mucus discharge that comes from the vagina; increased pelvic pressure and dull back pain; and leaking of amniotic fluid. This information is not intended to replace advice  given to you by your health care provider. Make sure you discuss any questions you have with your health care provider. Document Released: 05/04/2001 Document Revised: 10/16/2015 Document Reviewed: 07/11/2012 Elsevier Interactive Patient Education  2017 [employer]  

## 2016-10-22 LAB — GC/CHLAMYDIA PROBE AMP (~~LOC~~) NOT AT ARMC
CHLAMYDIA, DNA PROBE: NEGATIVE
NEISSERIA GONORRHEA: NEGATIVE

## 2016-10-27 ENCOUNTER — Ambulatory Visit (INDEPENDENT_AMBULATORY_CARE_PROVIDER_SITE_OTHER): Payer: Medicaid Other | Admitting: Obstetrics and Gynecology

## 2016-10-27 VITALS — BP 120/67 | HR 100 | Wt 197.9 lb

## 2016-10-27 DIAGNOSIS — Z3480 Encounter for supervision of other normal pregnancy, unspecified trimester: Secondary | ICD-10-CM

## 2016-10-27 DIAGNOSIS — Z3483 Encounter for supervision of other normal pregnancy, third trimester: Secondary | ICD-10-CM

## 2016-10-27 NOTE — Patient Instructions (Signed)

## 2016-10-27 NOTE — Progress Notes (Signed)
   PRENATAL VISIT NOTE  Subjective:  Paula LentJulissa Gamble is a 21 y.o. G3P0020 at 7747w2d being seen today for ongoing prenatal care.  She is currently monitored for the following issues for this low-risk pregnancy and has Encounter for supervision of normal pregnancy, antepartum and Uterine size date discrepancy pregnancy, third trimester on her problem list.  Patient reports She reports increased SOB with exertion. it resolves with rest. .  Contractions: Irregular. Vag. Bleeding: None.  Movement: Present. Denies leaking of fluid.   The following portions of the patient's history were reviewed and updated as appropriate: allergies, current medications, past family history, past medical history, past social history, past surgical history and problem list. Problem list updated.  Objective:   Vitals:   10/27/16 1414  BP: 120/67  Pulse: 100  Weight: 197 lb 14.4 oz (89.8 kg)    Fetal Status: Fetal Heart Rate (bpm): 154 Fundal Height: 38 cm Movement: Present     General:  Alert, oriented and cooperative. Patient is in no acute distress.  Skin: Skin is warm and dry. No rash noted.   Cardiovascular: Normal heart rate noted  Respiratory: Normal respiratory effort, no problems with respiration noted  Abdomen: Soft, gravid, appropriate for gestational age. Pain/Pressure: Present     Pelvic:  Cervical exam deferred        Extremities: Normal range of motion.  Edema: Mild pitting, slight indentation  Mental Status: Normal mood and affect. Normal behavior. Normal judgment and thought content.   Assessment and Plan:  Pregnancy: G3P0020 at 1047w2d  1. Supervision of low risk pregnancy. -umbical heights still >dates -[place] at 34wks >90% and AFI 21 -consider repeat growth US at 38 wks if still pregnant.   There are no diagnoses linked to this encounter. Preterm labor symptoms and general obstetric precautions including but not limited to vaginal bleeding, contractions, leaking of fluid and fetal  movement were reviewed in detail with the patient. Please refer to After Visit Summary for other counseling recommendations.  No Follow-up on file.   Ernestina PennaNicholas Thurza Kwiecinski, MD

## 2016-10-27 NOTE — Progress Notes (Signed)
   PRENATAL VISIT NOTE  Subjective:  Paula Gamble is a 21 y.o. G3P0020 at 4185w2d being seen today for ongoing prenatal care.  She is currently monitored for the following issues for this low-risk pregnancy and has Encounter for supervision of normal pregnancy, antepartum and Uterine size date discrepancy pregnancy, third trimester on her problem list.  Patient reports Patietn does report increase SOB. She reports she feels like when she exerts herself she has trouble catching her breath..  Contractions: Irregular. Vag. Bleeding: None.  Movement: Present. Denies leaking of fluid.   The following portions of the patient's history were reviewed and updated as appropriate: allergies, current medications, past family history, past medical history, past social history, past surgical history and problem list. Problem list updated.  Objective:   Vitals:   10/27/16 1414  BP: 120/67  Pulse: 100  Weight: 197 lb 14.4 oz (89.8 kg)    Fetal Status: Fetal Heart Rate (bpm): 154   Movement: Present     General:  Alert, oriented and cooperative. Patient is in no acute distress.  Skin: Skin is warm and dry. No rash noted.   Cardiovascular: Normal heart rate noted  Respiratory: Normal respiratory effort, no problems with respiration noted  Abdomen: Soft, gravid, appropriate for gestational age. Pain/Pressure: Present     Pelvic:  Cervical exam deferred        Extremities: Normal range of motion.  Edema: Mild pitting, slight indentation  Mental Status: Normal mood and affect. Normal behavior. Normal judgment and thought content.   Assessment and Plan:  Pregnancy: G3P0020 at 5185w2d  #1: Supervision of normal pregnancy - us at 34 wks AFI 21cm and EFW>90% -still measuring greater than dates -consider us at 38 wks if still pregnant.  There are no diagnoses linked to this encounter. Preterm labor symptoms and general obstetric precautions including but not limited to vaginal bleeding,  contractions, leaking of fluid and fetal movement were reviewed in detail with the patient. Please refer to After Visit Summary for other counseling recommendations.  No Follow-up on file.   Paula PennaNicholas Amaura Authier, MD

## 2016-10-29 ENCOUNTER — Inpatient Hospital Stay (HOSPITAL_COMMUNITY)
Admission: AD | Admit: 2016-10-29 | Discharge: 2016-10-30 | Disposition: A | Discharge status: Home / Self Care | Payer: Medicaid Other | Source: Ambulatory Visit | Attending: Obstetrics and Gynecology | Admitting: Obstetrics and Gynecology

## 2016-10-29 ENCOUNTER — Encounter (HOSPITAL_COMMUNITY): Payer: Self-pay | Admitting: *Deleted

## 2016-10-29 DIAGNOSIS — B373 Candidiasis of vulva and vagina: Secondary | ICD-10-CM | POA: Diagnosis not present

## 2016-10-29 DIAGNOSIS — O26893 Other specified pregnancy related conditions, third trimester: Secondary | ICD-10-CM | POA: Insufficient documentation

## 2016-10-29 DIAGNOSIS — Z3A35 35 weeks gestation of pregnancy: Secondary | ICD-10-CM | POA: Insufficient documentation

## 2016-10-29 DIAGNOSIS — O98813 Other maternal infectious and parasitic diseases complicating pregnancy, third trimester: Secondary | ICD-10-CM | POA: Insufficient documentation

## 2016-10-29 DIAGNOSIS — O4703 False labor before 37 completed weeks of gestation, third trimester: Secondary | ICD-10-CM | POA: Diagnosis not present

## 2016-10-29 DIAGNOSIS — Z87891 Personal history of nicotine dependence: Secondary | ICD-10-CM | POA: Insufficient documentation

## 2016-10-29 DIAGNOSIS — O3663X0 Maternal care for excessive fetal growth, third trimester, not applicable or unspecified: Secondary | ICD-10-CM | POA: Diagnosis not present

## 2016-10-29 DIAGNOSIS — Z3689 Encounter for other specified antenatal screening: Secondary | ICD-10-CM | POA: Diagnosis not present

## 2016-10-29 DIAGNOSIS — R109 Unspecified abdominal pain: Secondary | ICD-10-CM | POA: Insufficient documentation

## 2016-10-29 DIAGNOSIS — O4702 False labor before 37 completed weeks of gestation, second trimester: Secondary | ICD-10-CM | POA: Insufficient documentation

## 2016-10-29 DIAGNOSIS — O2303 Infections of kidney in pregnancy, third trimester: Secondary | ICD-10-CM | POA: Diagnosis not present

## 2016-10-29 DIAGNOSIS — Z3480 Encounter for supervision of other normal pregnancy, unspecified trimester: Secondary | ICD-10-CM

## 2016-10-29 DIAGNOSIS — O479 False labor, unspecified: Secondary | ICD-10-CM | POA: Diagnosis not present

## 2016-10-29 NOTE — MAU Note (Signed)
Contractions for and leaking some fld.

## 2016-10-30 ENCOUNTER — Inpatient Hospital Stay (EMERGENCY_DEPARTMENT_HOSPITAL)
Admission: AD | Admit: 2016-10-30 | Discharge: 2016-10-30 | Disposition: A | Discharge status: Home / Self Care | Payer: Medicaid Other | Source: Ambulatory Visit | Attending: Obstetrics & Gynecology | Admitting: Obstetrics & Gynecology

## 2016-10-30 ENCOUNTER — Encounter (HOSPITAL_COMMUNITY): Payer: Self-pay

## 2016-10-30 DIAGNOSIS — O4703 False labor before 37 completed weeks of gestation, third trimester: Secondary | ICD-10-CM | POA: Diagnosis not present

## 2016-10-30 DIAGNOSIS — Z3480 Encounter for supervision of other normal pregnancy, unspecified trimester: Secondary | ICD-10-CM

## 2016-10-30 DIAGNOSIS — Z3689 Encounter for other specified antenatal screening: Secondary | ICD-10-CM | POA: Diagnosis not present

## 2016-10-30 DIAGNOSIS — Z3A35 35 weeks gestation of pregnancy: Secondary | ICD-10-CM

## 2016-10-30 DIAGNOSIS — O479 False labor, unspecified: Secondary | ICD-10-CM | POA: Diagnosis not present

## 2016-10-30 DIAGNOSIS — O4700 False labor before 37 completed weeks of gestation, unspecified trimester: Secondary | ICD-10-CM

## 2016-10-30 DIAGNOSIS — B373 Candidiasis of vulva and vagina: Secondary | ICD-10-CM

## 2016-10-30 DIAGNOSIS — B3731 Acute candidiasis of vulva and vagina: Secondary | ICD-10-CM

## 2016-10-30 LAB — URINALYSIS, ROUTINE W REFLEX MICROSCOPIC
BILIRUBIN URINE: NEGATIVE
GLUCOSE, UA: NEGATIVE mg/dL
HGB URINE DIPSTICK: NEGATIVE
Ketones, ur: NEGATIVE mg/dL
NITRITE: NEGATIVE
PH: 7 (ref 5.0–8.0)
Protein, ur: NEGATIVE mg/dL
SPECIFIC GRAVITY, URINE: 1.006 (ref 1.005–1.030)

## 2016-10-30 LAB — AMNISURE RUPTURE OF MEMBRANE (ROM) NOT AT ARMC
Amnisure ROM: NEGATIVE
Amnisure ROM: NEGATIVE

## 2016-10-30 MED ORDER — HYDROMORPHONE HCL 1 MG/ML IJ SOLN
1.0000 mg | Freq: Once | INTRAMUSCULAR | Status: AC
  Administered 2016-10-30: 1 mg via INTRAMUSCULAR
  Filled 2016-10-30: qty 1

## 2016-10-30 MED ORDER — FLUCONAZOLE 150 MG PO TABS: 150.0000 mg | ORAL_TABLET | Freq: Once | ORAL | 0 refills | Status: AC

## 2016-10-30 NOTE — MAU Provider Note (Signed)
History     CSN: [id number]  Arrival date and time: 10/29/16 2330   None     Chief Complaint  Patient presents with  . Contractions   HPI Paula Gamble is a 21 y.o. G3P0020 at [redacted]w[redacted]d who presents to MAU today with complaint of contractions for the last 40-50 minutes. The patient denies vaginal bleeding, but is unsure about LOF. She states that it may be urine. She reports normal fetal movement, but feels a lot more pain with movement. The patient has consistently measured larger than dates and at last [place] EFW was > 90%tile.   OB History    Gravida Para Term Preterm AB Living   3       2     SAB TAB Ectopic Multiple Live Births   2              Past Medical History:  Diagnosis Date  . Pyelonephritis     Past Surgical History:  Procedure Laterality Date  . DILATION AND CURETTAGE OF UTERUS     x2    Family History  Problem Relation Age of Onset  . Diabetes Paternal Uncle   . Cancer Maternal Grandmother   . Diabetes Maternal Grandmother   . Hypertension Maternal Grandmother   . Cancer Paternal Grandmother        liver    Social History  Substance Use Topics  . Smoking status: Former Games developer  . Smokeless tobacco: Never Used  . Alcohol use No     Comment: Last drank in Oct/2017, per pt drank 20 beers     Allergies:  Allergies  Allergen Reactions  . Latex Itching    Prescriptions Prior to Admission  Medication Sig Dispense Refill Last Dose  . acetaminophen (TYLENOL) 500 MG tablet Take 500 mg by mouth every 6 (six) hours as needed.   Taking  . docusate sodium (COLACE) 100 MG capsule Take 1 capsule (100 mg total) by mouth 2 (two) times daily. 60 capsule 0 Taking  . Elastic Bandages & Supports (COMFORT FIT MATERNITY SUPP MED) MISC 1 Device by Does not apply route daily as needed. 1 each 0 Taking  . polyethylene glycol (MIRALAX / GLYCOLAX) packet Take 17 g by mouth daily as needed for mild constipation. 17 each 0 Taking  . Prenat w/o A Vit-FeFum-FePo-FA  (CONCEPT OB) 130-92.4-1 MG CAPS Take 1 capsule by mouth daily.   Taking    Review of Systems  Constitutional: Negative for fever.  Gastrointestinal: Positive for abdominal pain and constipation. Negative for diarrhea, nausea and vomiting.  Genitourinary: Negative for frequency, vaginal bleeding and vaginal discharge.   Physical Exam   Blood pressure 112/67, pulse (!) 104, temperature 98.3 F (36.8 C), height 5\' 4"  (1.626 m), weight 200 lb (90.7 kg), last menstrual period 02/23/2016.  Physical Exam  Nursing note and vitals reviewed. Constitutional: She is oriented to person, place, and time. She appears well-developed and well-nourished. No distress.  HENT:  Head: Normocephalic and atraumatic.  Cardiovascular: Normal rate.   Respiratory: Effort normal.  GI: Soft. She exhibits no distension and no mass. There is no tenderness. There is no rebound and no guarding.  Neurological: She is alert and oriented to person, place, and time.  Skin: Skin is warm and dry. No erythema.  Psychiatric: She has a normal mood and affect.    Results for orders placed or performed during the hospital encounter of 10/29/16 (from the past 24 hour(s))  Urinalysis, Routine w reflex microscopic  Status: Abnormal   Collection Time: 10/29/16 11:45 PM  Result Value Ref Range   Color, Urine STRAW (A) YELLOW   APPearance CLEAR CLEAR   Specific Gravity, Urine 1.006 1.005 - 1.030   pH 7.0 5.0 - 8.0   Glucose, UA NEGATIVE NEGATIVE mg/dL   Hgb urine dipstick NEGATIVE NEGATIVE   Bilirubin Urine NEGATIVE NEGATIVE   Ketones, ur NEGATIVE NEGATIVE mg/dL   Protein, ur NEGATIVE NEGATIVE mg/dL   Nitrite NEGATIVE NEGATIVE   Leukocytes, UA SMALL (A) NEGATIVE   RBC / HPF 0-5 0 - 5 RBC/hpf   WBC, UA 6-30 0 - 5 WBC/hpf   Bacteria, UA RARE (A) NONE SEEN   Squamous Epithelial / LPF 0-5 (A) NONE SEEN   Mucous PRESENT   Amnisure [facility] (rom[facility]     Status: None   Collection Time: 10/30/16 12:30 AM   Result Value Ref Range   Amnisure ROM NEGATIVE    Fetal Monitoring: Baseline: 120 bpm Variability: moderate Accelerations: 15 x 15 Decelerations: none Contractions: irregular   MAU Course  Procedures None  MDM Amnisure obtained - negative UA - normal Cervix closed After observing the patient further as we talked it is apparent that her pain is present with fetal movement. The patient is able to acknowledge this as well after monitoring for increased pain.  1 mg IM Dilaudid given for pain Patient resting comfortably prior to discharge  Assessment and Plan  A: SIUP at 8455w5d Abdominal pain in pregnancy, third trimester  LGA  P: Discharge home Tylenol PRN for pain Advised to use Colace as previously prescribed Preterm labor precautions discussed Patient advised to follow-up with [facility] as scheduled for routine prenatal care Patient may return to MAU as needed or if her condition were to change or worsen  Vonzella NippleJulie Margot Oriordan, PA-C 10/30/2016, 1:16 AM

## 2016-10-30 NOTE — Discharge Instructions (Signed)
Candidiasis vaginal en los adultos (Gastrointestinal Yeast Infection, Adult) La candidiasis vaginal es una afeccin que causa dolor, hinchazn y enrojecimiento (inflamacin) de la vagina. Tambin causa secrecin vaginal. Esta es una enfermedad frecuente. Algunas mujeres contraen esta infeccin con frecuencia. CAUSAS La causa de la infeccin es un cambio en el equilibrio normal de los hongos (cndida) y las bacterias que viven en la vagina. Esta alteracin deriva en el crecimiento excesivo de los hongos, lo que causa la inflamacin. FACTORES DE RIESGO Es ms probable que esta afeccin se manifieste en:  Las mujeres que toman antibiticos.  Las mujeres que tienen diabetes.  Las mujeres que toman anticonceptivos.  Las mujeres que estn embarazadas.  Las mujeres que se hacen duchas vaginales con frecuencia.  Las mujeres que tienen un sistema de defensa (inmunitario) dbil.  Las mujeres que han tomado corticoides durante mucho tiempo.  Las mujeres que usan ropa ajustada con frecuencia. SNTOMAS Los sntomas de esta afeccin incluyen lo siguiente:  Secrecin vaginal blanca y espesa.  Hinchazn, picazn, enrojecimiento e irritacin de la vagina. Los labios de la vagina (vulva) tambin se pueden infectar.  Dolor o ardor al Geographical information systems officer.  Dolor durante las [employer]. DIAGNSTICO Esta afeccin se diagnostica mediante la historia clnica y un examen fsico. Este incluye un examen plvico. El mdico examinar una muestra de la secrecin vaginal con un microscopio. Probablemente el mdico enve esta muestra al laboratorio para analizarla y confirmar el diagnstico. TRATAMIENTO Esta afeccin se trata con medicamentos. Los [employer] pueden ser recetados o de [address]. Podrn indicarle que use uno o ms de lo siguiente:  Medicamentos por va oral.  Medicamentos que se aplican como una crema.  Medicamentos que se colocan directamente en la vagina (vulos vaginales). INSTRUCCIONES  PARA EL CUIDADO EN EL HOGAR  Tome o aplquese los medicamentos de venta libre y Building control surveyor como se lo haya indicado el mdico.  No tenga relaciones sexuales hasta que el mdico lo autorice. Comunique a su compaero sexual que tiene una infeccin por hongos. Esas personas deben consultar al mdico si tienen sntomas.  No use ropa ajustada, como pantis o pantalones ajustados.  Evite el uso de tampones hasta que el mdico lo autorice.  Consuma ms yogur. Esto puede ayudar a Artist de la candidiasis.  Intente darse un bao de asiento para [employer]. Se trata de un bao de agua tibia que se toma mientras se est sentado. El agua solo debe Adult nurse las caderas y cubrir las nalgas. Hgalo 3o 4veces al da o como se lo haya indicado el mdico.  No se haga duchas vaginales.  Use ropa interior transpirable de algodn.  Si tiene diabetes, mantenga bajo control los niveles de Banker. SOLICITE ATENCIN MDICA SI:  Lance Muss.  Los sntomas desaparecen y Stage manager.  Los sntomas no mejoran con Scientist, research (medical).  Los sntomas empeoran.  Aparecen nuevos sntomas.  Aparecen ampollas alrededor o adentro de la vagina.  Le sale sangre de la vagina y no est menstruando.  Siente dolor en el abdomen. Esta informacin no tiene Theme park manager el consejo del mdico. Asegrese de hacerle al mdico cualquier pregunta que tenga. Document Released: 02/17/2005 Document Revised: 09/01/2015 Document Reviewed: 11/11/2014 Elsevier Interactive Patient Education  2018 [employer]. [employer] of the uterus can occur throughout pregnancy, but they are not always a sign that you are in labor. You may have practice contractions called Braxton Hicks contractions. These false labor contractions are sometimes  confused with true labor. What are Deberah Pelton contractions? Braxton Hicks contractions are tightening  movements that occur in the muscles of the uterus before labor. Unlike true labor contractions, these contractions do not result in opening (dilation) and thinning of the cervix. Toward the end of pregnancy (32-34 weeks), Braxton Hicks contractions can happen more often and may become stronger. These contractions are sometimes difficult to tell apart from true labor because they can be very uncomfortable. You should not feel embarrassed if you go to the hospital with false labor. Sometimes, the only way to tell if you are in true labor is for your health care provider to look for changes in the cervix. The health care provider will do a physical exam and may monitor your contractions. If you are not in true labor, the exam should show that your cervix is not dilating and your water has not broken. If there are no prenatal problems or other health problems associated with your pregnancy, it is completely safe for you to be sent home with false labor. You may continue to have Braxton Hicks contractions until you go into true labor. How can I tell the difference between true labor and false labor?  Differences ? False labor ? Contractions last 30-70 seconds.: Contractions are usually shorter and not as strong as true labor contractions. ? Contractions become very regular.: Contractions are usually irregular. ? Discomfort is usually felt in the top of the uterus, and it spreads to the lower abdomen and low back.: Contractions are often felt in the front of the lower abdomen and in the groin. ? Contractions do not go away with walking.: Contractions may go away when you walk around or change positions while lying down. ? Contractions usually become more intense and increase in frequency.: Contractions get weaker and are shorter-lasting as time goes on. ? The cervix dilates and gets thinner.: The cervix usually does not dilate or become thin. Follow these instructions at home:  Take over-the-counter and  prescription medicines only as told by your health care provider.  Keep up with your usual exercises and follow other instructions from your health care provider.  Eat and drink lightly if you think you are going into labor.  If Braxton Hicks contractions are making you uncomfortable: ? Change your position from lying down or resting to walking, or change from walking to resting. ? Sit and rest in a tub of warm water. ? Drink enough fluid to keep your urine clear or pale yellow. Dehydration may cause these contractions. ? Do slow and deep breathing several times an hour.  Keep all follow-up prenatal visits as told by your health care provider. This is important. Contact a health care provider if:  You have a fever.  You have continuous pain in your abdomen. Get help right away if:  Your contractions become stronger, more regular, and closer together.  You have fluid leaking or gushing from your vagina.  You pass blood-tinged mucus (bloody show).  You have bleeding from your vagina.  You have low back pain that you never had before.  You feel your babys head pushing down and causing pelvic pressure.  Your baby is not moving inside you as much as it used to. Summary  Contractions that occur before labor are called Braxton Hicks contractions, false labor, or practice contractions.  Braxton Hicks contractions are usually shorter, weaker, farther apart, and less regular than true labor contractions. True labor contractions usually become progressively stronger and regular and they  become more frequent.  Manage discomfort from [facility] LP[facility] Hicks contractions by changing position, resting in a warm bath, drinking plenty of water, or practicing deep breathing. This information is not intended to replace advice given to you by your health care provider. Make sure you discuss any questions you have with your health care provider. Document Released: 05/10/2005 Document Revised: 03/29/2016  Document Reviewed: 03/29/2016 Elsevier Interactive Patient Education  2017 [employer]

## 2016-10-30 NOTE — MAU Provider Note (Signed)
History     CSN: [id number]  Arrival date and time: 10/30/16 1503   First Provider Initiated Contact with Patient 10/30/16 1529      Chief Complaint  Patient presents with  . Labor Eval   [id number] @35 .5 wks here with ctx and LOF. Ctx started around 0530 and occurring q5 min. Leaking is ongoing since last night. She describes as clear fluid. Reports good FM. No VB. No vaginal itching.   OB History    Gravida Para Term Preterm AB Living   3       2     SAB TAB Ectopic Multiple Live Births   2              Past Medical History:  Diagnosis Date  . Pyelonephritis     Past Surgical History:  Procedure Laterality Date  . DILATION AND CURETTAGE OF UTERUS     x2    Family History  Problem Relation Age of Onset  . Diabetes Paternal Uncle   . Cancer Maternal Grandmother   . Diabetes Maternal Grandmother   . Hypertension Maternal Grandmother   . Cancer Paternal Grandmother        liver    Social History  Substance Use Topics  . Smoking status: Former Games developermoker  . Smokeless tobacco: Never Used  . Alcohol use No     Comment: Last drank in Oct/2017, per pt drank 20 beers     Allergies:  Allergies  Allergen Reactions  . Latex Itching    Prescriptions Prior to Admission  Medication Sig Dispense Refill Last Dose  . acetaminophen (TYLENOL) 500 MG tablet Take 500 mg by mouth every 6 (six) hours as needed.   Taking  . docusate sodium (COLACE) 100 MG capsule Take 1 capsule (100 mg total) by mouth 2 (two) times daily. 60 capsule 0 Taking  . Elastic Bandages & Supports (COMFORT FIT MATERNITY SUPP MED) MISC 1 Device by Does not apply route daily as needed. 1 each 0 Taking  . polyethylene glycol (MIRALAX / GLYCOLAX) packet Take 17 g by mouth daily as needed for mild constipation. 17 each 0 Taking  . Prenat w/o A Vit-FeFum-FePo-FA (CONCEPT OB) 130-92.4-1 MG CAPS Take 1 capsule by mouth daily.   Taking    Review of Systems  Gastrointestinal: Positive for abdominal pain.   Genitourinary: Positive for vaginal discharge.   Physical Exam   Blood pressure 116/68, pulse (!) 116, temperature 98.5 F (36.9 C), temperature source Oral, resp. rate 18, last menstrual period 02/23/2016.  Physical Exam  Nursing note and vitals reviewed. Constitutional: She is oriented to person, place, and time. She appears well-developed and well-nourished.  HENT:  Head: Normocephalic and atraumatic.  Neck: Normal range of motion.  Respiratory: Effort normal. No respiratory distress.  GI: Soft. She exhibits no distension. There is no tenderness.  Genitourinary:  Genitourinary Comments: External: no lesions or erythema Vagina: rugated, pink, moist, thick adherent yellow cheesy discharge to walls and cervix, small amt clear fluid pooling, fern negative SVE: closed/50  Musculoskeletal: Normal range of motion.  Neurological: She is alert and oriented to person, place, and time.  Skin: Skin is warm and dry.  Psychiatric: She has a normal mood and affect.   EFM: 135 bpm, mod variability, + accels, no decels Toco: irregular  Results for orders placed or performed during the hospital encounter of 10/30/16 (from the past 24 hour(s))  Amnisure [facility]     Status: None  Collection Time: 10/30/16  3:40 PM  Result Value Ref Range   Amnisure ROM NEGATIVE    MAU Course  Procedures  MDM Labs ordered and reviewed. No evidence of PTL or ROM. Will treat yeast. Stable for discharge home.  Assessment and Plan   1. [redacted] weeks gestation of pregnancy   2. Supervision of other normal pregnancy, antepartum   3. Preterm contractions   4. Yeast vaginitis   5. NST (non-stress test) reactive    Discharge home Follow up in OB office next week PTL precautions Rx Diflucan  Allergies as of 10/30/2016      Reactions   Latex Itching      Medication List    TAKE these medications   acetaminophen 500 MG tablet Commonly known as:  TYLENOL Take 500 mg by mouth  every 6 (six) hours as needed.   COMFORT FIT MATERNITY SUPP MED Misc 1 Device by Does not apply route daily as needed.   docusate sodium 100 MG capsule Commonly known as:  COLACE Take 1 capsule (100 mg total) by mouth 2 (two) times daily.   fluconazole 150 MG tablet Commonly known as:  DIFLUCAN Take 1 tablet (150 mg total) by mouth once. May repeat on day 4   polyethylene glycol packet Commonly known as:  MIRALAX / GLYCOLAX Take 17 g by mouth daily as needed for mild constipation.   prenatal multivitamin Tabs tablet Take 1 tablet by mouth daily at 12 noon.      Donette Larry, CNM 10/30/2016, 3:49 PM

## 2016-10-30 NOTE — Discharge Instructions (Signed)
Evaluación de los movimientos fetales  °(Fetal Movement Counts) °Nombre del paciente: __________________________________________________ Fecha de parto estimada: ____________________ °La evaluación de los movimientos fetales es muy recomendable en los embarazos de alto riesgo, pero también es una buena idea que lo hagan todas las embarazadas. El médico le indicará que comience a contarlos a las 28 semanas de embarazo. Los movimientos fetales suelen aumentar:  °· Después de una comida completa. °· Después de la actividad física. °· Después de comer o beber algo dulce o frío. °· En reposo. °Preste atención cuando sienta que el bebé está más activo. Esto le ayudará a notar un patrón de ciclos de vigilia y sueño de su bebé y cuáles son los factores que contribuyen a un aumento de los movimientos fetales. Es importante llevar a cabo un recuento de movimientos fetales, al mismo tiempo cada día, cuando el bebé normalmente está más activo.  °CÓMO CONTAR LOS MOVIMIENTOS FETALES °1. Busque un lugar tranquilo y cómodo para sentarse o recostarse sobre el lado izquierdo. Al recostarse sobre su lado izquierdo, le proporciona una mejor circulación de sangre y oxígeno al bebé. °2. Anote el día y la hora en una hoja de papel o en un diario. °3. Comience contando las pataditas, revoloteos, chasquidos, vueltas o pinchazos en un período de 2 horas. Debe sentir al menos 10 movimientos en 2 horas. °4. Si no siente 10 movimientos en 2 horas, espere 2 ó 3 horas y cuente de nuevo. Busque cambios en el patrón o si no cuenta lo suficiente en 2 horas. °SOLICITE ATENCIÓN MÉDICA SI:  °· Siente menos de 10 pataditas en 2 horas, en dos intentos. °· No hay movimientos durante una hora. °· El patrón se modifica o le lleva más tiempo cada día contar las 10 pataditas. °· Siente que el bebé no se mueve como lo hace habitualmente. °Fecha: ____________ Movimientos: ____________ Hora de inicio: ____________ Hora de finalización: ____________  °Fecha:  ____________ Movimientos: ____________ Hora de inicio: ____________ Hora de finalización: ____________  °Fecha: ____________ Movimientos: ____________ Hora de inicio: ____________ Hora de finalización: ____________  °Fecha: ____________ Movimientos: ____________ Hora de inicio: ____________ Hora de finalización: ____________  °Fecha: ____________ Movimientos: ____________ Hora de inicio: ____________ Hora de finalización: ____________  °Fecha: ____________ Movimientos: ____________ Hora de inicio: ____________ Hora de finalización: ____________  °Fecha: ____________ Movimientos: ____________ Hora de inicio: ____________ Hora de finalización: ____________  °Fecha: ____________ Movimientos: ____________ Hora de inicio: ____________ Hora de finalización: ____________  °Fecha: ____________ Movimientos: ____________ Hora de inicio: ____________ Hora de finalización: ____________  °Fecha: ____________ Movimientos: ____________ Hora de inicio: ____________ Hora de finalización: ____________  °Fecha: ____________ Movimientos: ____________ Hora de inicio: ____________ Hora de finalización: ____________  °Fecha: ____________ Movimientos: ____________ Hora de inicio: ____________ Hora de finalización: ____________  °Fecha: ____________ Movimientos: ____________ Hora de inicio: ____________ Hora de finalización: ____________  °Fecha: ____________ Movimientos: ____________ Hora de inicio: ____________ Hora de finalización: ____________  °Fecha: ____________ Movimientos: ____________ Hora de inicio: ____________ Hora de finalización: ____________  °Fecha: ____________ Movimientos: ____________ Hora de inicio: ____________ Hora de finalización: ____________  °Fecha: ____________ Movimientos: ____________ Hora de inicio: ____________ Hora de finalización: ____________  °Fecha: ____________ Movimientos: ____________ Hora de inicio: ____________ Hora de finalización: ____________  °Fecha: ____________ Movimientos: ____________ Hora  de inicio: ____________ Hora de finalización: ____________  °Fecha: ____________ Movimientos: ____________ Hora de inicio: ____________ Hora de finalización: ____________  °Fecha: ____________ Movimientos: ____________ Hora de inicio: ____________ Hora de finalización: ____________  °Fecha: ____________ Movimientos: ____________ Hora de inicio: ____________ Hora de   finalización: ____________  °Fecha: ____________ Movimientos: ____________ Hora de inicio: ____________ Hora de finalización: ____________  °Fecha: ____________ Movimientos: ____________ Hora de inicio: ____________ Hora de finalización: ____________  °Fecha: ____________ Movimientos: ____________ Hora de inicio: ____________ Hora de finalización: ____________  °Fecha: ____________ Movimientos: ____________ Hora de inicio: ____________ Hora de finalización: ____________  °Fecha: ____________ Movimientos: ____________ Hora de inicio: ____________ Hora de finalización: ____________  °Fecha: ____________ Movimientos: ____________ Hora de inicio: ____________ Hora de finalización: ____________  °Fecha: ____________ Movimientos: ____________ Hora de inicio: ____________ Hora de finalización: ____________  °Fecha: ____________ Movimientos: ____________ Hora de inicio: ____________ Hora de finalización: ____________  °Fecha: ____________ Movimientos: ____________ Hora de inicio: ____________ Hora de finalización: ____________  °Fecha: ____________ Movimientos: ____________ Hora de inicio: ____________ Hora de finalización: ____________  °Fecha: ____________ Movimientos: ____________ Hora de inicio: ____________ Hora de finalización: ____________  °Fecha: ____________ Movimientos: ____________ Hora de inicio: ____________ Hora de finalización: ____________  °Fecha: ____________ Movimientos: ____________ Hora de inicio: ____________ Hora de finalización: ____________  °Fecha: ____________ Movimientos: ____________ Hora de inicio: ____________ Hora de finalización:  ____________  °Fecha: ____________ Movimientos: ____________ Hora de inicio: ____________ Hora de finalización: ____________  °Fecha: ____________ Movimientos: ____________ Hora de inicio: ____________ Hora de finalización: ____________  °Fecha: ____________ Movimientos: ____________ Hora de inicio: ____________ Hora de finalización: ____________  °Fecha: ____________ Movimientos: ____________ Hora de inicio: ____________ Hora de finalización: ____________  °Fecha: ____________ Movimientos: ____________ Hora de inicio: ____________ Hora de finalización: ____________  °Fecha: ____________ Movimientos: ____________ Hora de inicio: ____________ Hora de finalización: ____________  °Fecha: ____________ Movimientos: ____________ Hora de inicio: ____________ Hora de finalización: ____________  °Fecha: ____________ Movimientos: ____________ Hora de inicio: ____________ Hora de finalización: ____________  °Fecha: ____________ Movimientos: ____________ Hora de inicio: ____________ Hora de finalización: ____________  °Fecha: ____________ Movimientos: ____________ Hora de inicio: ____________ Hora de finalización: ____________  °Fecha: ____________ Movimientos: ____________ Hora de inicio: ____________ Hora de finalización: ____________  °Fecha: ____________ Movimientos: ____________ Hora de inicio: ____________ Hora de finalización: ____________  °Fecha: ____________ Movimientos: ____________ Hora de inicio: ____________ Hora de finalización: ____________  °Fecha: ____________ Movimientos: ____________ Hora de inicio: ____________ Hora de finalización: ____________  °Fecha: ____________ Movimientos: ____________ Hora de inicio: ____________ Hora de finalización: ____________  °Fecha: ____________ Movimientos: ____________ Hora de inicio: ____________ Hora de finalización: ____________  °Fecha: ____________ Movimientos: ____________ Hora de inicio: ____________ Hora de finalización: ____________  °Fecha: ____________  Movimientos: ____________ Hora de inicio: ____________ Hora de finalización: ____________  °Fecha: ____________ Movimientos: ____________ Hora de inicio: ____________ Hora de finalización: ____________  °Fecha: ____________ Movimientos: ____________ Hora de inicio: ____________ Hora de finalización: ____________  °Esta información no tiene como fin reemplazar el consejo del médico. Asegúrese de hacerle al médico cualquier pregunta que tenga. °Document Released: 08/17/2007 Document Revised: 04/26/2012 °Elsevier Interactive Patient Education © 2017 Else[employer] Inc[employer] ° ° °Contracciones de Braxton Hicks °(Braxton Hicks Contractions) °Durante el embarazo, pueden presentarse contracciones uterinas que no siempre indican que está en trabajo de parto. °¿QUÉ SON LAS CONTRACCIONES DE BRAXTON HICKS? °Las contracciones que se presentan antes del trabajo de parto se conocen como contracciones de Braxton Hicks o falso trabajo de parto. Hacia el final del embarazo (32 a 34 semanas), estas contracciones pueden aparecen con más frecuencia y volverse más intensas. No corresponden al trabajo de parto verdadero porque estas contracciones no producen el agrandamiento (la dilatación) y el afinamiento del cuello del útero. Algunas veces, es difícil distinguirlas del trabajo de parto verdadero porque en algunos casos pueden ser muy intensas, y las personas tienen   diferentes niveles de tolerancia al dolor. No debe sentirse avergonzada si concurre al hospital con falso trabajo de parto. En ocasiones, la única forma de saber si el trabajo de parto es verdadero es que el médico determine si hay cambios en el cuello del útero. °Si no hay problemas prenatales u otras complicaciones de salud asociadas con el embarazo, no habrá inconvenientes si la envían a su casa con falso trabajo de parto y espera que comience el verdadero. °CÓMO DIFERENCIAR EL TRABAJO DE PARTO FALSO DEL VERDADERO °Falso trabajo de parto °· Las contracciones del falso trabajo de  parto duran menos y no son tan intensas como las verdaderas. °· Generalmente son irregulares. °· A menudo, se sienten en la parte delantera de la parte baja del abdomen y en la ingle, °· y pueden desaparecer cuando camina o cambia de posición mientras está acostada. °· Las contracciones se vuelven más débiles y su duración es menor a medida que el tiempo transcurre. °· Por lo general, no se hacen progresivamente más intensas, regulares y cercanas entre sí como en el caso del trabajo de parto verdadero. °Verdadero trabajo de parto °· Las contracciones del verdadero trabajo de parto duran de 30 a 70 segundos, son muy regulares y suelen volverse más intensas, y aumenta su frecuencia. °· No desaparecen cuando camina. °· La molestia generalmente se siente en la parte superior del útero y se extiende hacia la zona inferior del abdomen y hacia la cintura. °· El médico podrá examinarla para determinar si el trabajo de parto es verdadero. El examen mostrará si el cuello del útero se está dilatando y afinando. °LO QUE DEBE RECORDAR °· Continúe haciendo los ejercicios habituales y siga otras indicaciones que el médico le dé. °· Tome todos los medicamentos como le indicó el médico. °· Concurra a las visitas prenatales regulares. °· Coma y beba con moderación si cree que está en trabajo de parto. °· Si las contracciones de Braxton Hicks le provocan incomodidad: °? Cambie de posición: si está acostada o descansando, camine; si está caminando, descanse. °? Siéntese y descanse en una bañera con agua tibia. °? Beba 2 o 3 vasos de agua. La deshidratación puede provocar contracciones. °? Respire lenta y profundamente varias veces por hora. ° °¿CUÁNDO DEBO BUSCAR ASISTENCIA MÉDICA INMEDIATA? °Solicite atención médica de inmediato si: °· Las contracciones se intensifican, se hacen más regulares y cercanas entre sí. °· Tiene una pérdida de líquido por la vagina. °· Tiene fiebre. °· Elimina mucosidad manchada con sangre. °· Tiene una  hemorragia vaginal abundante. °· Tiene dolor abdominal permanente. °· Tiene un dolor en la zona lumbar que nunca tuvo antes. °· Siente que la cabeza del bebé empuja hacia abajo y ejerce presión en la zona pélvica. °· El bebé no se mueve tanto como solía. °Esta información no tiene como fin reemplazar el consejo del médico. Asegúrese de hacerle al médico cualquier pregunta que tenga. °Document Released: 02/17/2005 Document Revised: 09/01/2015 Document Reviewed: 02/19/2013 °Elsevier Interactive Patient Education © 2017 Elsevier Inc. ° °

## 2016-10-30 NOTE — MAU Note (Signed)
Pt said she was here last night and left this morning around 0300. Said at 0500 the ctx started picking up again and now are 5 minutes apart.

## 2016-11-01 ENCOUNTER — Inpatient Hospital Stay (HOSPITAL_COMMUNITY)
Admission: AD | Admit: 2016-11-01 | Discharge: 2016-11-01 | Disposition: A | Discharge status: Home / Self Care | Payer: Medicaid Other | Source: Ambulatory Visit | Attending: Obstetrics and Gynecology | Admitting: Obstetrics and Gynecology

## 2016-11-01 ENCOUNTER — Encounter (HOSPITAL_COMMUNITY): Payer: Self-pay | Admitting: *Deleted

## 2016-11-01 DIAGNOSIS — Z87891 Personal history of nicotine dependence: Secondary | ICD-10-CM | POA: Diagnosis not present

## 2016-11-01 DIAGNOSIS — Z3A36 36 weeks gestation of pregnancy: Secondary | ICD-10-CM | POA: Diagnosis not present

## 2016-11-01 DIAGNOSIS — O99613 Diseases of the digestive system complicating pregnancy, third trimester: Secondary | ICD-10-CM | POA: Diagnosis not present

## 2016-11-01 DIAGNOSIS — O9989 Other specified diseases and conditions complicating pregnancy, childbirth and the puerperium: Secondary | ICD-10-CM | POA: Diagnosis not present

## 2016-11-01 DIAGNOSIS — Z91040 Latex allergy status: Secondary | ICD-10-CM | POA: Insufficient documentation

## 2016-11-01 DIAGNOSIS — K5900 Constipation, unspecified: Secondary | ICD-10-CM | POA: Diagnosis not present

## 2016-11-01 DIAGNOSIS — R109 Unspecified abdominal pain: Secondary | ICD-10-CM | POA: Diagnosis present

## 2016-11-01 MED ORDER — FLEET ENEMA 7-19 GM/118ML RE ENEM
1.0000 | ENEMA | Freq: Once | RECTAL | Status: AC
  Administered 2016-11-01: 1 via RECTAL

## 2016-11-01 NOTE — Discharge Instructions (Signed)
Estreimiento - Adultos (Constipation, Adult) Se llama constipacin cuando:  Elimina heces (defeca) menos de 3 veces por semana.  Tiene dificultad para defecar.  Las heces son secas y duras o son ms grandes que lo normal. CUIDADOS EN EL HOGAR  Consuma alimentos con alto contenido de fibra. Por ejemplo, frutas, vegetales, porotos y cereales integrales, como el arroz integral.  Evite los alimentos ricos en grasas y azcar. Estos incluyen patatas fritas, hamburguesas, galletas, dulces y refrescos.  Si no consume suficientes alimentos ricos en fibras, tome productos que tengan agregado de fibra (suplementos).  Beba suficiente lquido para mantener el pis (orina) claro o de color amarillo plido.  Haga ejercicio en forma regular, o como lo indique su mdico.  Vaya al bao cuando sienta la necesidad de defecar. No se aguante las ganas.  Solo tome los medicamentos que le haya indicado su mdico. No tome medicamentos que le ayuden a defecar (laxantes) sin antes consultarlo con su mdico.  SOLICITE AYUDA DE INMEDIATO SI:  Observa sangre brillante en las heces (materia fecal).  El estreimiento dura ms de 4 das o empeora.  Tiene dolor en el vientre (abdominal) o el trasero (recto).  Las heces son delgadas (como un lpiz).  Pierde peso de manera inexplicable.  ASEGRESE DE QUE:  Comprende estas instrucciones.  Controlar su afeccin.  Recibir ayuda de inmediato si no mejora o si empeora.  Esta informacin no tiene como fin reemplazar el consejo del mdico. Asegrese de hacerle al mdico cualquier pregunta que tenga. Document Released: 06/12/2010 Document Revised: 05/31/2014 Document Reviewed: 10/29/2015 Elsevier Interactive Patient Education  2017 Elsevier Inc.  

## 2016-11-01 NOTE — MAU Provider Note (Signed)
Patient Paula LentJulissa Lebron-Ubiera is a 21 y.o.  [id number] At [id number] here with complaints of contractions and abdominal pain from not being able to have a bowel movement. She has been taking miralax but not her colace. Patient had one bowel movement yesterday and Saturday but only passed a small amount of stool.  She says she has been drinking 12-13 glasses of water per day. She denies bleeding, leaking of fluid or decreased fetal movements.   Patient has LGA on her problem list. [facility].   The patient's visitor is requesting a C-section for pain. She has had 14 MAU visits in the past 6 months for non-specific complaints.  History     CSN: [id number]  Arrival date and time: 11/01/16 1448   First Provider Initiated Contact with Patient 11/01/16 1559      Chief Complaint  Patient presents with  . Abdominal Pain   Abdominal Pain  This is a new problem. The current episode started today. Progression since onset: she feels like they are going away but then they come back stronger. The pain is located in the LLQ, RLQ and suprapubic region. The pain is at a severity of 10/10. The quality of the pain is colicky. Associated symptoms include constipation. The pain is relieved by being still.    OB History    Gravida Para Term Preterm AB Living   3       2     SAB TAB Ectopic Multiple Live Births   2              Past Medical History:  Diagnosis Date  . Pyelonephritis     Past Surgical History:  Procedure Laterality Date  . DILATION AND CURETTAGE OF UTERUS     x2    Family History  Problem Relation Age of Onset  . Diabetes Paternal Uncle   . Cancer Maternal Grandmother   . Diabetes Maternal Grandmother   . Hypertension Maternal Grandmother   . Cancer Paternal Grandmother        liver    Social History  Substance Use Topics  . Smoking status: Former Games developermoker  . Smokeless tobacco: Never Used  . Alcohol use No     Comment: Last drank in Oct/2017, per pt  drank 20 beers     Allergies:  Allergies  Allergen Reactions  . Latex Itching    Prescriptions Prior to Admission  Medication Sig Dispense Refill Last Dose  . acetaminophen (TYLENOL) 500 MG tablet Take 500 mg by mouth every 6 (six) hours as needed.   Past Week at Unknown time  . docusate sodium (COLACE) 100 MG capsule Take 1 capsule (100 mg total) by mouth 2 (two) times daily. 60 capsule 0 10/31/2016 at Unknown time  . polyethylene glycol (MIRALAX / GLYCOLAX) packet Take 17 g by mouth daily as needed for mild constipation. 17 each 0 11/01/2016 at Unknown time  . Prenatal Vit-Fe Fumarate-FA (PRENATAL MULTIVITAMIN) TABS tablet Take 1 tablet by mouth daily at 12 noon.   10/31/2016 at Unknown time  . Elastic Bandages & Supports (COMFORT FIT MATERNITY SUPP MED) MISC 1 Device by Does not apply route daily as needed. 1 each 0 Taking    Review of Systems  Respiratory: Negative.   Cardiovascular: Negative.   Gastrointestinal: Positive for abdominal pain and constipation.  Genitourinary: Negative.   Neurological: Negative.    Physical Exam   Blood pressure 122/63, pulse (!) 102, temperature 97.5 F (36.4 C),  temperature source Oral, resp. rate 18, last menstrual period 02/23/2016, SpO2 100 %.  Physical Exam  Constitutional: She is oriented to person, place, and time. She appears well-developed.  HENT:  Head: Normocephalic.  Neck: Normal range of motion.  Respiratory: Effort normal.  GI: Soft.  Genitourinary: Vagina normal. No vaginal discharge found.  Musculoskeletal: Normal range of motion.  Neurological: She is alert and oriented to person, place, and time. She has normal reflexes.  Skin: Skin is warm and dry.  Cervix is 1 cm and soft; large amount of stool palpated through the vaginal wall.   MAU Course  Procedures  MDM This CNM examined the patient immediately upon her entry into the MAU as patient was complaining loudly of abdominal pain. As soon as she was informed of her  cervical exam, patient calmed down and began talking with her visitors. Patient received an enema in MAU and passed a large amount of stool. Patient is now using her phone in bed and talking with her visitor.  I explained to patient and her visitor that a C-section is not indicated at this time and that she should talk with her provider at her clinic appt tomorrow (11-02-2016).   NST 130 bpm, mod variability, present acel, neg decels, uterine irratability    Assessment and Plan   1. Constipation during pregnancy in third trimester    2. Patient stable for discharge. Reviewed with patient the importance of taking her colace and miralax as recommended and keeping her appt tomorrow at [facility]. Patient verbalized understanding.  3. Reviewed warning signs of when to return to the MAU (bleeding, leaking of fluid, decreased fetal movements).    Charlesetta Garibaldi Keath Matera   11/01/2016, 4:05 PM

## 2016-11-01 NOTE — MAU Note (Signed)
Patient brought from car directly to mau room; patient tearful  +lower abdominal pain Intermittent Started around 11am  Denies LOF or VB.  +FM  Samara DeistKathryn CNM bedside--VE 1cm

## 2016-11-02 ENCOUNTER — Ambulatory Visit (INDEPENDENT_AMBULATORY_CARE_PROVIDER_SITE_OTHER): Payer: Medicaid Other | Admitting: Medical

## 2016-11-02 VITALS — BP 114/62 | HR 97 | Wt 196.9 lb

## 2016-11-02 DIAGNOSIS — Z3480 Encounter for supervision of other normal pregnancy, unspecified trimester: Secondary | ICD-10-CM

## 2016-11-02 DIAGNOSIS — O26843 Uterine size-date discrepancy, third trimester: Secondary | ICD-10-CM

## 2016-11-02 DIAGNOSIS — Z3483 Encounter for supervision of other normal pregnancy, third trimester: Secondary | ICD-10-CM

## 2016-11-02 NOTE — Patient Instructions (Signed)
Evaluación de los movimientos fetales  °(Fetal Movement Counts) °Nombre del paciente: __________________________________________________ Fecha de parto estimada: ____________________ °La evaluación de los movimientos fetales es muy recomendable en los embarazos de alto riesgo, pero también es una buena idea que lo hagan todas las embarazadas. El médico le indicará que comience a contarlos a las 28 semanas de embarazo. Los movimientos fetales suelen aumentar:  °· Después de una comida completa. °· Después de la actividad física. °· Después de comer o beber algo dulce o frío. °· En reposo. °Preste atención cuando sienta que el bebé está más activo. Esto le ayudará a notar un patrón de ciclos de vigilia y sueño de su bebé y cuáles son los factores que contribuyen a un aumento de los movimientos fetales. Es importante llevar a cabo un recuento de movimientos fetales, al mismo tiempo cada día, cuando el bebé normalmente está más activo.  °CÓMO CONTAR LOS MOVIMIENTOS FETALES °1. Busque un lugar tranquilo y cómodo para sentarse o recostarse sobre el lado izquierdo. Al recostarse sobre su lado izquierdo, le proporciona una mejor circulación de sangre y oxígeno al bebé. °2. Anote el día y la hora en una hoja de papel o en un diario. °3. Comience contando las pataditas, revoloteos, chasquidos, vueltas o pinchazos en un período de 2 horas. Debe sentir al menos 10 movimientos en 2 horas. °4. Si no siente 10 movimientos en 2 horas, espere 2 ó 3 horas y cuente de nuevo. Busque cambios en el patrón o si no cuenta lo suficiente en 2 horas. °SOLICITE ATENCIÓN MÉDICA SI:  °· Siente menos de 10 pataditas en 2 horas, en dos intentos. °· No hay movimientos durante una hora. °· El patrón se modifica o le lleva más tiempo cada día contar las 10 pataditas. °· Siente que el bebé no se mueve como lo hace habitualmente. °Fecha: ____________ Movimientos: ____________ Hora de inicio: ____________ Hora de finalización: ____________  °Fecha:  ____________ Movimientos: ____________ Hora de inicio: ____________ Hora de finalización: ____________  °Fecha: ____________ Movimientos: ____________ Hora de inicio: ____________ Hora de finalización: ____________  °Fecha: ____________ Movimientos: ____________ Hora de inicio: ____________ Hora de finalización: ____________  °Fecha: ____________ Movimientos: ____________ Hora de inicio: ____________ Hora de finalización: ____________  °Fecha: ____________ Movimientos: ____________ Hora de inicio: ____________ Hora de finalización: ____________  °Fecha: ____________ Movimientos: ____________ Hora de inicio: ____________ Hora de finalización: ____________  °Fecha: ____________ Movimientos: ____________ Hora de inicio: ____________ Hora de finalización: ____________  °Fecha: ____________ Movimientos: ____________ Hora de inicio: ____________ Hora de finalización: ____________  °Fecha: ____________ Movimientos: ____________ Hora de inicio: ____________ Hora de finalización: ____________  °Fecha: ____________ Movimientos: ____________ Hora de inicio: ____________ Hora de finalización: ____________  °Fecha: ____________ Movimientos: ____________ Hora de inicio: ____________ Hora de finalización: ____________  °Fecha: ____________ Movimientos: ____________ Hora de inicio: ____________ Hora de finalización: ____________  °Fecha: ____________ Movimientos: ____________ Hora de inicio: ____________ Hora de finalización: ____________  °Fecha: ____________ Movimientos: ____________ Hora de inicio: ____________ Hora de finalización: ____________  °Fecha: ____________ Movimientos: ____________ Hora de inicio: ____________ Hora de finalización: ____________  °Fecha: ____________ Movimientos: ____________ Hora de inicio: ____________ Hora de finalización: ____________  °Fecha: ____________ Movimientos: ____________ Hora de inicio: ____________ Hora de finalización: ____________  °Fecha: ____________ Movimientos: ____________ Hora  de inicio: ____________ Hora de finalización: ____________  °Fecha: ____________ Movimientos: ____________ Hora de inicio: ____________ Hora de finalización: ____________  °Fecha: ____________ Movimientos: ____________ Hora de inicio: ____________ Hora de finalización: ____________  °Fecha: ____________ Movimientos: ____________ Hora de inicio: ____________ Hora de   finalización: ____________  °Fecha: ____________ Movimientos: ____________ Hora de inicio: ____________ Hora de finalización: ____________  °Fecha: ____________ Movimientos: ____________ Hora de inicio: ____________ Hora de finalización: ____________  °Fecha: ____________ Movimientos: ____________ Hora de inicio: ____________ Hora de finalización: ____________  °Fecha: ____________ Movimientos: ____________ Hora de inicio: ____________ Hora de finalización: ____________  °Fecha: ____________ Movimientos: ____________ Hora de inicio: ____________ Hora de finalización: ____________  °Fecha: ____________ Movimientos: ____________ Hora de inicio: ____________ Hora de finalización: ____________  °Fecha: ____________ Movimientos: ____________ Hora de inicio: ____________ Hora de finalización: ____________  °Fecha: ____________ Movimientos: ____________ Hora de inicio: ____________ Hora de finalización: ____________  °Fecha: ____________ Movimientos: ____________ Hora de inicio: ____________ Hora de finalización: ____________  °Fecha: ____________ Movimientos: ____________ Hora de inicio: ____________ Hora de finalización: ____________  °Fecha: ____________ Movimientos: ____________ Hora de inicio: ____________ Hora de finalización: ____________  °Fecha: ____________ Movimientos: ____________ Hora de inicio: ____________ Hora de finalización: ____________  °Fecha: ____________ Movimientos: ____________ Hora de inicio: ____________ Hora de finalización: ____________  °Fecha: ____________ Movimientos: ____________ Hora de inicio: ____________ Hora de finalización:  ____________  °Fecha: ____________ Movimientos: ____________ Hora de inicio: ____________ Hora de finalización: ____________  °Fecha: ____________ Movimientos: ____________ Hora de inicio: ____________ Hora de finalización: ____________  °Fecha: ____________ Movimientos: ____________ Hora de inicio: ____________ Hora de finalización: ____________  °Fecha: ____________ Movimientos: ____________ Hora de inicio: ____________ Hora de finalización: ____________  °Fecha: ____________ Movimientos: ____________ Hora de inicio: ____________ Hora de finalización: ____________  °Fecha: ____________ Movimientos: ____________ Hora de inicio: ____________ Hora de finalización: ____________  °Fecha: ____________ Movimientos: ____________ Hora de inicio: ____________ Hora de finalización: ____________  °Fecha: ____________ Movimientos: ____________ Hora de inicio: ____________ Hora de finalización: ____________  °Fecha: ____________ Movimientos: ____________ Hora de inicio: ____________ Hora de finalización: ____________  °Fecha: ____________ Movimientos: ____________ Hora de inicio: ____________ Hora de finalización: ____________  °Fecha: ____________ Movimientos: ____________ Hora de inicio: ____________ Hora de finalización: ____________  °Fecha: ____________ Movimientos: ____________ Hora de inicio: ____________ Hora de finalización: ____________  °Fecha: ____________ Movimientos: ____________ Hora de inicio: ____________ Hora de finalización: ____________  °Fecha: ____________ Movimientos: ____________ Hora de inicio: ____________ Hora de finalización: ____________  °Fecha: ____________ Movimientos: ____________ Hora de inicio: ____________ Hora de finalización: ____________  °Fecha: ____________ Movimientos: ____________ Hora de inicio: ____________ Hora de finalización: ____________  °Fecha: ____________ Movimientos: ____________ Hora de inicio: ____________ Hora de finalización: ____________  °Fecha: ____________  Movimientos: ____________ Hora de inicio: ____________ Hora de finalización: ____________  °Fecha: ____________ Movimientos: ____________ Hora de inicio: ____________ Hora de finalización: ____________  °Fecha: ____________ Movimientos: ____________ Hora de inicio: ____________ Hora de finalización: ____________  °Esta información no tiene como fin reemplazar el consejo del médico. Asegúrese de hacerle al médico cualquier pregunta que tenga. °Document Released: 08/17/2007 Document Revised: 04/26/2012 °Elsevier Interactive Patient Education © 2017 [employer] ° ° °Contracciones de Braxton Hicks °(Braxton Hicks Contractions) °Durante el embarazo, pueden presentarse contracciones uterinas que no siempre indican que está en trabajo de parto. °¿QUÉ SON LAS CONTRACCIONES DE BRAXTON HICKS? °Las contracciones que se presentan antes del trabajo de parto se conocen como contracciones de Braxton Hicks o falso trabajo de parto. Hacia el final del embarazo (32 a 34 semanas), estas contracciones pueden aparecen con más frecuencia y volverse más intensas. No corresponden al trabajo de parto verdadero porque estas contracciones no producen el agrandamiento (la dilatación) y el afinamiento del cuello del útero. Algunas veces, es difícil distinguirlas del trabajo de parto verdadero porque en algunos casos pueden ser muy intensas, y las personas tienen   diferentes niveles de tolerancia al dolor. No debe sentirse avergonzada si concurre al hospital con falso trabajo de parto. En ocasiones, la única forma de saber si el trabajo de parto es verdadero es que el médico determine si hay cambios en el cuello del útero. °Si no hay problemas prenatales u otras complicaciones de salud asociadas con el embarazo, no habrá inconvenientes si la envían a su casa con falso trabajo de parto y espera que comience el verdadero. °CÓMO DIFERENCIAR EL TRABAJO DE PARTO FALSO DEL VERDADERO °Falso trabajo de parto °· Las contracciones del falso trabajo de  parto duran menos y no son tan intensas como las verdaderas. °· Generalmente son irregulares. °· A menudo, se sienten en la parte delantera de la parte baja del abdomen y en la ingle, °· y pueden desaparecer cuando camina o cambia de posición mientras está acostada. °· Las contracciones se vuelven más débiles y su duración es menor a medida que el tiempo transcurre. °· Por lo general, no se hacen progresivamente más intensas, regulares y cercanas entre sí como en el caso del trabajo de parto verdadero. °Verdadero trabajo de parto °· Las contracciones del verdadero trabajo de parto duran de 30 a 70 segundos, son muy regulares y suelen volverse más intensas, y aumenta su frecuencia. °· No desaparecen cuando camina. °· La molestia generalmente se siente en la parte superior del útero y se extiende hacia la zona inferior del abdomen y hacia la cintura. °· El médico podrá examinarla para determinar si el trabajo de parto es verdadero. El examen mostrará si el cuello del útero se está dilatando y afinando. °LO QUE DEBE RECORDAR °· Continúe haciendo los ejercicios habituales y siga otras indicaciones que el médico le dé. °· Tome todos los medicamentos como le indicó el médico. °· Concurra a las visitas prenatales regulares. °· Coma y beba con moderación si cree que está en trabajo de parto. °· Si las contracciones de Braxton Hicks le provocan incomodidad: °? Cambie de posición: si está acostada o descansando, camine; si está caminando, descanse. °? Siéntese y descanse en una bañera con agua tibia. °? Beba 2 o 3 vasos de agua. La deshidratación puede provocar contracciones. °? Respire lenta y profundamente varias veces por hora. ° °¿CUÁNDO DEBO BUSCAR ASISTENCIA MÉDICA INMEDIATA? °Solicite atención médica de inmediato si: °· Las contracciones se intensifican, se hacen más regulares y cercanas entre sí. °· Tiene una pérdida de líquido por la vagina. °· Tiene fiebre. °· Elimina mucosidad manchada con sangre. °· Tiene una  hemorragia vaginal abundante. °· Tiene dolor abdominal permanente. °· Tiene un dolor en la zona lumbar que nunca tuvo antes. °· Siente que la cabeza del bebé empuja hacia abajo y ejerce presión en la zona pélvica. °· El bebé no se mueve tanto como solía. °Esta información no tiene como fin reemplazar el consejo del médico. Asegúrese de hacerle al médico cualquier pregunta que tenga. °Document Released: 02/17/2005 Document Revised: 09/01/2015 Document Reviewed: 02/19/2013 °Elsevier Interactive Patient Education © 2017 Elsevier Inc. ° °

## 2016-11-02 NOTE — Progress Notes (Signed)
   PRENATAL VISIT NOTE  Subjective:  Paula Gamble is a 21 y.o. G3P0020 at 5080w1d being seen today for ongoing prenatal care.  She is currently monitored for the following issues for this low-risk pregnancy and has Encounter for supervision of normal pregnancy, antepartum and Uterine size date discrepancy pregnancy, third trimester on her problem list.  Patient reports occasional contractions and vaginal discharge.  Contractions: Irregular. Vag. Bleeding: None.  Movement: Present. Denies leaking of fluid.   The following portions of the patient's history were reviewed and updated as appropriate: allergies, current medications, past family history, past medical history, past social history, past surgical history and problem list. Problem list updated.  Objective:   Vitals:   11/02/16 1255  BP: 114/62  Pulse: 97  Weight: 196 lb 14.4 oz (89.3 kg)    Fetal Status: Fetal Heart Rate (bpm): 135 Fundal Height: 40 cm Movement: Present     General:  Alert, oriented and cooperative. Patient is in no acute distress.  Skin: Skin is warm and dry. No rash noted.   Cardiovascular: Normal heart rate noted  Respiratory: Normal respiratory effort, no problems with respiration noted  Abdomen: Soft, gravid, appropriate for gestational age. Pain/Pressure: Present     Pelvic:  Cervical exam deferred        Extremities: Normal range of motion.  Edema: Mild pitting, slight indentation  Mental Status: Normal mood and affect. Normal behavior. Normal judgment and thought content.   Assessment and Plan:  Pregnancy: G3P0020 at 2980w1d  1. Supervision of other normal pregnancy, antepartum - Culture, beta strep (group b only) - Patient had negative GC/Chlamydia < 2 weeks ago  2. Uterine size date discrepancy pregnancy, third trimester - US MFM OB FOLLOW UP; scheduled  Preterm labor symptoms and general obstetric precautions including but not limited to vaginal bleeding, contractions, leaking of fluid  and fetal movement were reviewed in detail with the patient. Please refer to After Visit Summary for other counseling recommendations.  Return in about 1 week (around 11/09/2016) for LOB  wtih MD or fellow.   Vonzella NippleJulie Pilar Westergaard, PA-C

## 2016-11-05 LAB — CULTURE, BETA STREP (GROUP B ONLY): Strep Gp B Culture: POSITIVE — AB

## 2016-11-09 ENCOUNTER — Encounter: Payer: Self-pay | Admitting: Obstetrics and Gynecology

## 2016-11-11 ENCOUNTER — Encounter: Payer: Self-pay | Admitting: Obstetrics and Gynecology

## 2016-11-11 ENCOUNTER — Ambulatory Visit (INDEPENDENT_AMBULATORY_CARE_PROVIDER_SITE_OTHER): Payer: Medicaid Other | Admitting: Obstetrics and Gynecology

## 2016-11-11 VITALS — BP 114/67 | HR 92 | Wt 201.3 lb

## 2016-11-11 DIAGNOSIS — Z3483 Encounter for supervision of other normal pregnancy, third trimester: Secondary | ICD-10-CM

## 2016-11-11 DIAGNOSIS — O99820 Streptococcus B carrier state complicating pregnancy: Secondary | ICD-10-CM | POA: Insufficient documentation

## 2016-11-11 DIAGNOSIS — Z789 Other specified health status: Secondary | ICD-10-CM

## 2016-11-11 DIAGNOSIS — O3663X0 Maternal care for excessive fetal growth, third trimester, not applicable or unspecified: Secondary | ICD-10-CM

## 2016-11-11 DIAGNOSIS — Z3480 Encounter for supervision of other normal pregnancy, unspecified trimester: Secondary | ICD-10-CM

## 2016-11-11 DIAGNOSIS — Z758 Other problems related to medical facilities and other health care: Secondary | ICD-10-CM | POA: Insufficient documentation

## 2016-11-11 NOTE — Progress Notes (Signed)
Prenatal Visit Note Date: 11/11/2016 Clinic: [facility]  Subjective:  Paula Gamble is a 21 y.o. G3P0020 at 4264w3d being seen today for ongoing prenatal care.  She is currently monitored for the following issues for this low-risk pregnancy and has Encounter for supervision of normal pregnancy, antepartum; LGA (large for gestational age) fetus affecting management of mother; Language barrier; and GBS (group B Streptococcus carrier), +RV culture, currently pregnant on her problem list.  Patient reports no complaints.   Contractions: Not present. Vag. Bleeding: None.  Movement: Present. Denies leaking of fluid.   The following portions of the patient's history were reviewed and updated as appropriate: allergies, current medications, past family history, past medical history, past social history, past surgical history and problem list. Problem list updated.  Objective:   Vitals:   11/11/16 1425  BP: 114/67  Pulse: 92  Weight: 201 lb 4.8 oz (91.3 kg)    Fetal Status: Fetal Heart Rate (bpm): 130 Fundal Height: 39 cm Movement: Present  Presentation: Vertex  General:  Alert, oriented and cooperative. Patient is in no acute distress.  Skin: Skin is warm and dry. No rash noted.   Cardiovascular: Normal heart rate noted  Respiratory: Normal respiratory effort, no problems with respiration noted  Abdomen: Soft, gravid, appropriate for gestational age. Pain/Pressure: Present     Pelvic:  Cervical exam deferred        Extremities: Normal range of motion.  Edema: Trace  Mental Status: Normal mood and affect. Normal behavior. Normal judgment and thought content.   Urinalysis:      Assessment and Plan:  Pregnancy: G3P0020 at 3964w3d  Routine care. Has repeat growth scheduled for LGA fetus. nexplanon GBS +: tx in labor  interpeter used  Term labor symptoms and general obstetric precautions including but not limited to vaginal bleeding, contractions, leaking of fluid  and fetal movement were reviewed in detail with the patient. Please refer to After Visit Summary for other counseling recommendations.  Return in about 1 week (around 11/18/2016) for 7-10d rob.   Ridgway BingPickens, Maxine Huynh, MD

## 2016-11-11 NOTE — Progress Notes (Signed)
Stratus interprerter Moses Lakelaudia [phone]

## 2016-11-15 ENCOUNTER — Ambulatory Visit (HOSPITAL_COMMUNITY)
Admission: RE | Admit: 2016-11-15 | Discharge: 2016-11-15 | Disposition: A | Discharge status: Home / Self Care | Payer: Medicaid Other | Source: Ambulatory Visit | Attending: Medical | Admitting: Medical

## 2016-11-15 DIAGNOSIS — Z3A38 38 weeks gestation of pregnancy: Secondary | ICD-10-CM | POA: Diagnosis not present

## 2016-11-15 DIAGNOSIS — O3663X0 Maternal care for excessive fetal growth, third trimester, not applicable or unspecified: Secondary | ICD-10-CM | POA: Diagnosis not present

## 2016-11-15 DIAGNOSIS — O26843 Uterine size-date discrepancy, third trimester: Secondary | ICD-10-CM | POA: Diagnosis not present

## 2016-11-16 ENCOUNTER — Ambulatory Visit (INDEPENDENT_AMBULATORY_CARE_PROVIDER_SITE_OTHER): Payer: Medicaid Other | Admitting: Obstetrics and Gynecology

## 2016-11-16 VITALS — BP 123/60 | HR 98 | Wt 199.5 lb

## 2016-11-16 DIAGNOSIS — Z3490 Encounter for supervision of normal pregnancy, unspecified, unspecified trimester: Secondary | ICD-10-CM

## 2016-11-16 DIAGNOSIS — O3660X0 Maternal care for excessive fetal growth, unspecified trimester, not applicable or unspecified: Secondary | ICD-10-CM

## 2016-11-16 DIAGNOSIS — Z3483 Encounter for supervision of other normal pregnancy, third trimester: Secondary | ICD-10-CM

## 2016-11-16 NOTE — Progress Notes (Signed)
   PRENATAL VISIT NOTE  Subjective:  Paula Gamble is a 21 y.o. G3P0020 at 9716w1d being seen today for ongoing prenatal care.  She is currently monitored for the following issues for this low-risk pregnancy and has Encounter for supervision of normal pregnancy, antepartum; LGA (large for gestational age) fetus affecting management of mother; Language barrier; and GBS (group B Streptococcus carrier), +RV culture, currently pregnant on her problem list.  Patient reports no complaints.  Contractions: Irregular. Vag. Bleeding: None.  Movement: Present. Denies leaking of fluid.   The following portions of the patient's history were reviewed and updated as appropriate: allergies, current medications, past family history, past medical history, past social history, past surgical history and problem list. Problem list updated.  Objective:   Vitals:   11/16/16 1544  BP: 123/60  Pulse: 98  Weight: 199 lb 8 oz (90.5 kg)    Fetal Status: Fetal Heart Rate (bpm): 130   Movement: Present  Presentation: Vertex  General:  Alert, oriented and cooperative. Patient is in no acute distress.  Skin: Skin is warm and dry. No rash noted.   Cardiovascular: Normal heart rate noted  Respiratory: Normal respiratory effort, no problems with respiration noted  Abdomen: Soft, gravid, appropriate for gestational age. Pain/Pressure: Present     Pelvic:  Cervical exam performed Dilation: Fingertip      Extremities: Normal range of motion.  Edema: Trace  Mental Status: Normal mood and affect. Normal behavior. Normal judgment and thought content.   Assessment and Plan:  Pregnancy: G3P0020 at 1716w1d  1. Encounter for supervision of normal pregnancy, antepartum, unspecified gravidity Doing well no compalints  2. Excessive fetal growth affecting management of pregnancy, antepartum, single or unspecified fetus EFW 4043g on 6/25 AC >95% Continue expectant manegment at this time.  Term labor symptoms and general  obstetric precautions including but not limited to vaginal bleeding, contractions, leaking of fluid and fetal movement were reviewed in detail with the patient. Please refer to After Visit Summary for other counseling recommendations.  No Follow-up on file.   Ernestina PennaNicholas Leary Mcnulty, MD

## 2016-11-16 NOTE — Patient Instructions (Signed)

## 2016-11-17 ENCOUNTER — Inpatient Hospital Stay (HOSPITAL_COMMUNITY)
Admission: AD | Admit: 2016-11-17 | Discharge: 2016-11-17 | Disposition: A | Discharge status: Home / Self Care | Payer: Medicaid Other | Source: Ambulatory Visit | Attending: Obstetrics and Gynecology | Admitting: Obstetrics and Gynecology

## 2016-11-17 DIAGNOSIS — O479 False labor, unspecified: Secondary | ICD-10-CM | POA: Insufficient documentation

## 2016-11-17 DIAGNOSIS — Z3A38 38 weeks gestation of pregnancy: Secondary | ICD-10-CM | POA: Diagnosis not present

## 2016-11-17 NOTE — MAU Note (Signed)
Per use of interpreter services, at this point, pt. Does not want an epidural or pain medication.

## 2016-11-17 NOTE — MAU Note (Signed)
I have communicated with Cleone Slimaroline Neill, CNM-S and reviewed vital signs:  Vitals:   11/17/16 2125 11/17/16 2221  BP: 125/67 112/66  Pulse: 99 94  Resp: 20 18  Temp: 98.3 F (36.8 C)     Vaginal exam:  Dilation: Fingertip Effacement (%): Thick Cervical Position: Posterior Station: -3 Exam by:: Moet Mikulski, RN ,   Also reviewed contraction pattern and that non-stress test is reactive.  It has been documented that patient is contracting irregularly with no cervical change not indicating active labor.  Patient denies any other complaints.  Based on this report provider has given order for discharge.  A discharge order and diagnosis entered by a provider.   Labor discharge instructions reviewed with patient.

## 2016-11-17 NOTE — MAU Note (Signed)
V[employer] stratus used for conversationsJonetta Speak: Luis [phone], Princess BruinsBivianna [phone], Maurine MinisterDennis [phone] (the machine keeps disconnecting). Discharge instructions and labor precautions reviewed with patient, SVE explained to patient and her mother...who is at her bedside.

## 2016-11-17 NOTE — MAU Note (Signed)
Pt here with c/o contractions tonight, starting at 2pm; Denies any bleeding or leaking of fluid. Having some discharge. Reports positive fetal movement.

## 2016-11-17 NOTE — MAU Note (Signed)
Pt. Signed discharged papers via hard copy, electronic system disconnected in patient's room. Hard copy place with files and sent to medical records.

## 2016-11-18 ENCOUNTER — Encounter (HOSPITAL_COMMUNITY): Payer: Self-pay | Admitting: *Deleted

## 2016-11-18 ENCOUNTER — Inpatient Hospital Stay (HOSPITAL_COMMUNITY)
Admission: AD | Admit: 2016-11-18 | Discharge: 2016-11-18 | Disposition: A | Discharge status: Home / Self Care | Payer: Medicaid Other | Source: Ambulatory Visit | Attending: Obstetrics & Gynecology | Admitting: Obstetrics & Gynecology

## 2016-11-18 DIAGNOSIS — Z8249 Family history of ischemic heart disease and other diseases of the circulatory system: Secondary | ICD-10-CM | POA: Insufficient documentation

## 2016-11-18 DIAGNOSIS — Z3A38 38 weeks gestation of pregnancy: Secondary | ICD-10-CM | POA: Insufficient documentation

## 2016-11-18 DIAGNOSIS — Z91040 Latex allergy status: Secondary | ICD-10-CM | POA: Diagnosis not present

## 2016-11-18 DIAGNOSIS — R109 Unspecified abdominal pain: Secondary | ICD-10-CM | POA: Diagnosis not present

## 2016-11-18 DIAGNOSIS — Z800 Family history of malignant neoplasm of digestive organs: Secondary | ICD-10-CM | POA: Diagnosis not present

## 2016-11-18 DIAGNOSIS — R51 Headache: Secondary | ICD-10-CM | POA: Diagnosis not present

## 2016-11-18 DIAGNOSIS — Z833 Family history of diabetes mellitus: Secondary | ICD-10-CM | POA: Diagnosis not present

## 2016-11-18 DIAGNOSIS — Z79899 Other long term (current) drug therapy: Secondary | ICD-10-CM | POA: Diagnosis not present

## 2016-11-18 DIAGNOSIS — R1010 Upper abdominal pain, unspecified: Secondary | ICD-10-CM | POA: Insufficient documentation

## 2016-11-18 DIAGNOSIS — O26893 Other specified pregnancy related conditions, third trimester: Secondary | ICD-10-CM | POA: Insufficient documentation

## 2016-11-18 DIAGNOSIS — Z87891 Personal history of nicotine dependence: Secondary | ICD-10-CM | POA: Insufficient documentation

## 2016-11-18 DIAGNOSIS — H538 Other visual disturbances: Secondary | ICD-10-CM | POA: Diagnosis not present

## 2016-11-18 LAB — COMPREHENSIVE METABOLIC PANEL
ALBUMIN: 2.9 g/dL — AB (ref 3.5–5.0)
ALT: 10 U/L — AB (ref 14–54)
ANION GAP: 8 (ref 5–15)
AST: 18 U/L (ref 15–41)
Alkaline Phosphatase: 196 U/L — ABNORMAL HIGH (ref 38–126)
BUN: 7 mg/dL (ref 6–20)
CO2: 22 mmol/L (ref 22–32)
Calcium: 8.9 mg/dL (ref 8.9–10.3)
Chloride: 106 mmol/L (ref 101–111)
Creatinine, Ser: 0.56 mg/dL (ref 0.44–1.00)
GFR calc Af Amer: >60 mL/min (ref >60)
GFR calc non Af Amer: >60 mL/min (ref >60)
Glucose, Bld: 102 mg/dL — ABNORMAL HIGH (ref 65–99)
Potassium: 3.9 mmol/L (ref 3.5–5.1)
SODIUM: 136 mmol/L (ref 135–145)
Total Bilirubin: 0.2 mg/dL — ABNORMAL LOW (ref 0.3–1.2)
Total Protein: 6.2 g/dL — ABNORMAL LOW (ref 6.5–8.1)

## 2016-11-18 LAB — URINALYSIS, ROUTINE W REFLEX MICROSCOPIC
Bilirubin Urine: NEGATIVE
Glucose, UA: NEGATIVE mg/dL
Hgb urine dipstick: NEGATIVE
KETONES UR: NEGATIVE mg/dL
Leukocytes, UA: NEGATIVE
NITRITE: NEGATIVE
PROTEIN: NEGATIVE mg/dL
Specific Gravity, Urine: 1.005 (ref 1.005–1.030)
pH: 7 (ref 5.0–8.0)

## 2016-11-18 LAB — PROTEIN / CREATININE RATIO, URINE
Creatinine, Urine: 31 mg/dL
Total Protein, Urine: <6 mg/dL

## 2016-11-18 LAB — CBC
HCT: 35.7 % — ABNORMAL LOW (ref 36.0–46.0)
HEMOGLOBIN: 12.2 g/dL (ref 12.0–15.0)
MCH: 32.4 pg (ref 26.0–34.0)
MCHC: 34.2 g/dL (ref 30.0–36.0)
MCV: 94.7 fL (ref 78.0–100.0)
Platelets: 166 10*3/uL (ref 150–400)
RBC: 3.77 MIL/uL — ABNORMAL LOW (ref 3.87–5.11)
RDW: 13.8 % (ref 11.5–15.5)
WBC: 9.2 10*3/uL (ref 4.0–10.5)

## 2016-11-18 MED ORDER — BUTALBITAL-APAP-CAFFEINE 50-325-40 MG PO TABS
2.0000 | ORAL_TABLET | Freq: Once | ORAL | Status: AC
  Administered 2016-11-18: 2 via ORAL
  Filled 2016-11-18: qty 2

## 2016-11-18 NOTE — Discharge Instructions (Signed)
Dolor abdominal durante el embarazo (Abdominal Pain During Pregnancy) El dolor de vientre (abdominal) es habitual durante el embarazo. Generalmente no se trata de un problema grave. Otras veces puede ser un signo de que algo no anda bien. Siempre comunquese con su mdico si tiene dolor abdominal. CUIDADOS EN EL HOGAR Controle el dolor para ver si hay cambios. Las indicaciones que siguen pueden ayudarla a sentirse mejor:  Notenga sexo (relaciones sexuales) ni se coloque nada dentro de la vagina hasta que se sienta mejor.  Haga reposo hasta que el dolor se calme.  Si siente ganas de vomitar (nuseas ) beba lquidos claros. No consuma alimentos slidos hasta que se sienta mejor.  Slo tome los medicamentos que le haya indicado su mdico.  Cumpla con las visitas al mdico segn las indicaciones. SOLICITE AYUDA DE INMEDIATO SI:  Tiene un sangrado, pierde lquido o elimina trozos de tejido por la vagina.  Siente ms dolor o clicos.  Comienza a vomitar.  Siente dolor al orinar u observa sangre en la orina.  Tiene fiebre.  No siente que el beb se mueva mucho.  Se siente muy dbil o cree que va a desmayarse.  Tiene dificultad para respirar con o sin dolor en el vientre.  Siente un dolor de cabeza muy intenso y dolor en el vientre.  Observa que sale un lquido por la vagina y tiene dolor abdominal.  La materia fecal es lquida (diarrea).  El dolor en el viente no desaparece, o empeora, luego de hacer reposo. ASEGRESE DE QUE:  Comprende estas instrucciones.  Controlar su afeccin.  Recibir ayuda de inmediato si no mejora o si empeora. Esta informacin no tiene como fin reemplazar el consejo del mdico. Asegrese de hacerle al mdico cualquier pregunta que tenga. Document Released: 01/20/2011 Document Revised: 09/01/2015 Document Reviewed: 12/07/2012 Elsevier Interactive Patient Education  2018 [employer].  

## 2016-11-18 NOTE — MAU Provider Note (Signed)
History     CSN: [id number]  Arrival date and time: 11/18/16 4098   First Provider Initiated Contact with Patient 11/18/16 2108      Chief Complaint  Patient presents with  . Headache   G3P0020 @38 .3 weeks here with HA and abdominal pain. HA started earlier today. Location is frontal. She took 1 Tylenol and had no relief. Reports intermittent blurry vision over the last 3 hrs. Upper abdominal pain started 10 minutes ago. Location is in fundal region. No VB, LOF, or regular ctx. Reports good FM.    OB History    Gravida Para Term Preterm AB Living   3       2     SAB TAB Ectopic Multiple Live Births   2              Past Medical History:  Diagnosis Date  . Pyelonephritis     Past Surgical History:  Procedure Laterality Date  . DILATION AND CURETTAGE OF UTERUS     x2    Family History  Problem Relation Age of Onset  . Diabetes Paternal Uncle   . Cancer Maternal Grandmother   . Diabetes Maternal Grandmother   . Hypertension Maternal Grandmother   . Cancer Paternal Grandmother        liver    Social History  Substance Use Topics  . Smoking status: Former Games developer  . Smokeless tobacco: Never Used  . Alcohol use No     Comment: Last drank in Oct/2017, per pt drank 20 beers     Allergies:  Allergies  Allergen Reactions  . Latex Itching    Prescriptions Prior to Admission  Medication Sig Dispense Refill Last Dose  . polyethylene glycol (MIRALAX / GLYCOLAX) packet Take 17 g by mouth daily as needed for mild constipation. 17 each 0 Past Week at Unknown time  . Prenatal Vit-Fe Fumarate-FA (PRENATAL MULTIVITAMIN) TABS tablet Take 1 tablet by mouth daily at 12 noon.   11/17/2016 at Unknown time    Review of Systems  Eyes: Positive for visual disturbance.  Respiratory: Negative for shortness of breath.   Gastrointestinal: Positive for abdominal pain.  Genitourinary: Negative for vaginal bleeding and vaginal discharge.  Neurological: Positive for headaches.    Physical Exam   Blood pressure 116/64, pulse 89, temperature 98.1 F (36.7 C), resp. rate 16, last menstrual period 02/23/2016.  Physical Exam  Constitutional: She is oriented to person, place, and time. She appears well-developed and well-nourished. No distress.  HENT:  Head: Normocephalic and atraumatic.  Neck: Normal range of motion.  Cardiovascular: Normal rate.   Respiratory: Effort normal. No respiratory distress.  GI: Soft. She exhibits no distension. There is no tenderness.  gravid  Musculoskeletal: Normal range of motion.  Neurological: She is alert and oriented to person, place, and time.  Skin: Skin is warm and dry.  Psychiatric: She has a normal mood and affect.  EFM: 125 bpm, mod variability, + accels, no decels Toco: irregular  Results for orders placed or performed during the hospital encounter of 11/18/16 (from the past 24 hour(s))  Urinalysis, Routine w reflex microscopic     Status: Abnormal   Collection Time: 11/18/16  7:15 PM  Result Value Ref Range   Color, Urine STRAW (A) YELLOW   APPearance CLEAR CLEAR   Specific Gravity, Urine 1.005 1.005 - 1.030   pH 7.0 5.0 - 8.0   Glucose, UA NEGATIVE NEGATIVE mg/dL   Hgb urine dipstick NEGATIVE NEGATIVE   Bilirubin Urine  NEGATIVE NEGATIVE   Ketones, ur NEGATIVE NEGATIVE mg/dL   Protein, ur NEGATIVE NEGATIVE mg/dL   Nitrite NEGATIVE NEGATIVE   Leukocytes, UA NEGATIVE NEGATIVE  Protein / creatinine ratio, urine     Status: None (Preliminary result)   Collection Time: 11/18/16  7:15 PM  Result Value Ref Range   Creatinine, Urine 31.00 mg/dL   Total Protein, Urine PENDING mg/dL   Protein Creatinine Ratio PENDING 0.00 - 0.15 mg/mg[Cre]  CBC     Status: Abnormal   Collection Time: 11/18/16  9:29 PM  Result Value Ref Range   WBC 9.2 4.0 - 10.5 K/uL   RBC 3.77 (L) 3.87 - 5.11 MIL/uL   Hemoglobin 12.2 12.0 - 15.0 g/dL   HCT 16.135.7 (L) 09.636.0 - 04.546.0 %   MCV 94.7 78.0 - 100.0 fL   MCH 32.4 26.0 - 34.0 pg   MCHC 34.2  30.0 - 36.0 g/dL   RDW 40.913.8 81.111.5 - 91.415.5 %   Platelets 166 150 - 400 K/uL  Comprehensive metabolic panel     Status: Abnormal   Collection Time: 11/18/16  9:29 PM  Result Value Ref Range   Sodium 136 135 - 145 mmol/L   Potassium 3.9 3.5 - 5.1 mmol/L   Chloride 106 101 - 111 mmol/L   CO2 22 22 - 32 mmol/L   Glucose, Bld 102 (H) 65 - 99 mg/dL   BUN 7 6 - 20 mg/dL   Creatinine, Ser 7.820.56 0.44 - 1.00 mg/dL   Calcium 8.9 8.9 - 95.610.3 mg/dL   Total Protein 6.2 (L) 6.5 - 8.1 g/dL   Albumin 2.9 (L) 3.5 - 5.0 g/dL   AST 18 15 - 41 U/L   ALT 10 (L) 14 - 54 U/L   Alkaline Phosphatase 196 (H) 38 - 126 U/L   Total Bilirubin 0.2 (L) 0.3 - 1.2 mg/dL   GFR calc non Af Amer >60 >60 mL/min   GFR calc Af Amer >60 >60 mL/min   Anion gap 8 5 - 15    MAU Course  Procedures Fioricet  MDM Labs ordered and reviewed.  Transfer of care given to Leda MinHogan, CNM Bhambri, Melanie, CNM  11/18/2016 10:02 PM    Assessment and Plan   1. Abdominal pain during pregnancy in third trimester   2. [redacted] weeks gestation of pregnancy    DC home Comfort measures reviewed  3rd Trimester precautions  labor precautions  Fetal kick counts RX: none  Return to MAU as needed FU with OB as planned  Follow-up Information    [facility].   Specialty:  Obstetrics and Gynecology Contact information: [address] [place] [id number] [phone]

## 2016-11-18 NOTE — MAU Note (Signed)
Pt reports headache for one hour. Took 500 mg tylenol at headache onset with no relief.

## 2016-11-21 ENCOUNTER — Encounter (HOSPITAL_COMMUNITY): Payer: Self-pay | Admitting: *Deleted

## 2016-11-21 ENCOUNTER — Inpatient Hospital Stay (HOSPITAL_COMMUNITY)
Admission: AD | Admit: 2016-11-21 | Discharge: 2016-11-21 | Disposition: A | Discharge status: Home / Self Care | Payer: Medicaid Other | Source: Ambulatory Visit | Attending: Obstetrics & Gynecology | Admitting: Obstetrics & Gynecology

## 2016-11-21 DIAGNOSIS — O26893 Other specified pregnancy related conditions, third trimester: Secondary | ICD-10-CM | POA: Diagnosis not present

## 2016-11-21 DIAGNOSIS — R51 Headache: Secondary | ICD-10-CM | POA: Diagnosis not present

## 2016-11-21 DIAGNOSIS — K5900 Constipation, unspecified: Secondary | ICD-10-CM

## 2016-11-21 DIAGNOSIS — R519 Headache, unspecified: Secondary | ICD-10-CM

## 2016-11-21 DIAGNOSIS — Z3A38 38 weeks gestation of pregnancy: Secondary | ICD-10-CM | POA: Insufficient documentation

## 2016-11-21 DIAGNOSIS — O9989 Other specified diseases and conditions complicating pregnancy, childbirth and the puerperium: Secondary | ICD-10-CM | POA: Diagnosis not present

## 2016-11-21 DIAGNOSIS — Z3689 Encounter for other specified antenatal screening: Secondary | ICD-10-CM

## 2016-11-21 LAB — CBC
HCT: 36.3 % (ref 36.0–46.0)
HEMOGLOBIN: 12.2 g/dL (ref 12.0–15.0)
MCH: 31.9 pg (ref 26.0–34.0)
MCHC: 33.6 g/dL (ref 30.0–36.0)
MCV: 95 fL (ref 78.0–100.0)
PLATELETS: 153 10*3/uL (ref 150–400)
RBC: 3.82 MIL/uL — ABNORMAL LOW (ref 3.87–5.11)
RDW: 13.8 % (ref 11.5–15.5)
WBC: 9.2 10*3/uL (ref 4.0–10.5)

## 2016-11-21 LAB — COMPREHENSIVE METABOLIC PANEL
ALK PHOS: 221 U/L — AB (ref 38–126)
ALT: 11 U/L — AB (ref 14–54)
ANION GAP: 7 (ref 5–15)
AST: 17 U/L (ref 15–41)
Albumin: 2.9 g/dL — ABNORMAL LOW (ref 3.5–5.0)
BUN: 9 mg/dL (ref 6–20)
CALCIUM: 9.1 mg/dL (ref 8.9–10.3)
CO2: 21 mmol/L — ABNORMAL LOW (ref 22–32)
CREATININE: 0.47 mg/dL (ref 0.44–1.00)
Chloride: 109 mmol/L (ref 101–111)
Glucose, Bld: 97 mg/dL (ref 65–99)
Potassium: 3.8 mmol/L (ref 3.5–5.1)
SODIUM: 137 mmol/L (ref 135–145)
Total Bilirubin: 0.4 mg/dL (ref 0.3–1.2)
Total Protein: 6 g/dL — ABNORMAL LOW (ref 6.5–8.1)

## 2016-11-21 LAB — PROTEIN / CREATININE RATIO, URINE
CREATININE, URINE: 69 mg/dL
PROTEIN CREATININE RATIO: 0.16 mg/mg{creat} — AB (ref 0.00–0.15)
TOTAL PROTEIN, URINE: 11 mg/dL

## 2016-11-21 MED ORDER — LACTATED RINGERS IV BOLUS (SEPSIS)
1000.0000 mL | Freq: Once | INTRAVENOUS | Status: AC
  Administered 2016-11-21: 1000 mL via INTRAVENOUS

## 2016-11-21 MED ORDER — DIPHENHYDRAMINE HCL 50 MG/ML IJ SOLN
25.0000 mg | Freq: Once | INTRAMUSCULAR | Status: AC
  Administered 2016-11-21: 25 mg via INTRAVENOUS
  Filled 2016-11-21: qty 1

## 2016-11-21 MED ORDER — PROCHLORPERAZINE EDISYLATE 5 MG/ML IJ SOLN
10.0000 mg | Freq: Once | INTRAMUSCULAR | Status: AC
  Administered 2016-11-21: 10 mg via INTRAVENOUS
  Filled 2016-11-21: qty 2

## 2016-11-21 MED ORDER — DEXAMETHASONE SODIUM PHOSPHATE 10 MG/ML IJ SOLN
10.0000 mg | Freq: Once | INTRAMUSCULAR | Status: AC
  Administered 2016-11-21: 10 mg via INTRAVENOUS
  Filled 2016-11-21: qty 1

## 2016-11-21 NOTE — MAU Provider Note (Signed)
CC: HA  First Provider Initiated Contact with Patient 11/21/16 1850     S: Paula Gamble  is a 21 y.o. y.o. year old 583P0020 female at 2941w6d weeks gestation who presents to MAU with severe HA and blurry vision. No Hx HTN. Seen for same problem 11/18/16, but Sx have worsened. Had Pre-E work-up that was neg. Hx few previous similar HA's prior to pregnancy. Resolved w/ IV meds at ED.   Associated symptoms: Neg for fever, chills, weakness, difficulties w/ speech or gait, neck stiffness, congestion, epigastric pain, N/V. Pos vision changes, sensitivity to light. Contractions: Mild Vaginal bleeding: Neg Fetal movement: Nml There are no aggravating or aleviating factors.  O:  Patient Vitals for the past 24 hrs:  BP Temp Temp src Pulse Resp SpO2  11/21/16 2054 (!) 100/46 98 F (36.7 C) Oral 89 16 -  11/21/16 1825 128/65 98.5 F (36.9 C) - (!) 103 18 100 %   General: Mild distress Heart: Mild tachycardia Lungs: Normal rate and effort Abd: Soft, NT, Gravid, S=D Extremities: No Pedal edema Neuro: 2+ deep tendon reflexes, No clonus Pelvic:  Dilation: Fingertip Effacement (%): Thick Exam by:: ginger morris rn  EFM: 130, Moderate variability, 15 x 15 accelerations, no decelerations Toco: Irreg, mild  Results for orders placed or performed during the hospital encounter of 11/21/16 (from the past 24 hour(s))  CBC     Status: Abnormal   Collection Time: 11/21/16  7:30 PM  Result Value Ref Range   WBC 9.2 4.0 - 10.5 K/uL   RBC 3.82 (L) 3.87 - 5.11 MIL/uL   Hemoglobin 12.2 12.0 - 15.0 g/dL   HCT 16.136.3 09.636.0 - 04.546.0 %   MCV 95.0 78.0 - 100.0 fL   MCH 31.9 26.0 - 34.0 pg   MCHC 33.6 30.0 - 36.0 g/dL   RDW 40.913.8 81.111.5 - 91.415.5 %   Platelets 153 150 - 400 K/uL  Comprehensive metabolic panel     Status: Abnormal   Collection Time: 11/21/16  7:30 PM  Result Value Ref Range   Sodium 137 135 - 145 mmol/L   Potassium 3.8 3.5 - 5.1 mmol/L   Chloride 109 101 - 111 mmol/L   CO2 21 (L) 22 - 32  mmol/L   Glucose, Bld 97 65 - 99 mg/dL   BUN 9 6 - 20 mg/dL   Creatinine, Ser 7.820.47 0.44 - 1.00 mg/dL   Calcium 9.1 8.9 - 95.610.3 mg/dL   Total Protein 6.0 (L) 6.5 - 8.1 g/dL   Albumin 2.9 (L) 3.5 - 5.0 g/dL   AST 17 15 - 41 U/L   ALT 11 (L) 14 - 54 U/L   Alkaline Phosphatase 221 (H) 38 - 126 U/L   Total Bilirubin 0.4 0.3 - 1.2 mg/dL   GFR calc non Af Amer >60 >60 mL/min   GFR calc Af Amer >60 >60 mL/min   Anion gap 7 5 - 15  Protein / creatinine ratio, urine     Status: Abnormal   Collection Time: 11/21/16  7:45 PM  Result Value Ref Range   Creatinine, Urine 69.00 mg/dL   Total Protein, Urine 11 mg/dL   Protein Creatinine Ratio 0.16 (H) 0.00 - 0.15 mg/mg[Cre]   MAU Course/MDM  Orders Placed This Encounter  Procedures  . CBC  . Comprehensive metabolic panel  . Protein / creatinine ratio, urine  . Contraction - monitoring  . External fetal heart monitoring  . Vaginal exam  . Insert peripheral IV  . Discharge patient  Meds ordered this encounter  Medications  . lactated ringers bolus 1,000 mL  . dexamethasone (DECADRON) injection 10 mg  . prochlorperazine (COMPAZINE) injection 10 mg  . diphenhydrAMINE (BENADRYL) injection 25 mg   HA resolved. No evidence of Pre-E or other emergent condition.   A: [redacted]w[redacted]d week IUP HA in pregnancy w/out evidence of Pre-E FHR reactive  P: Discharge home in stable condition. HA red flags and Preeclampsia precautions reviewed. Follow-up at your doctor's office as scheduled or sooner as needed if symptoms worsen. Return to maternity admissions as needed in emergencies  [place], [place] 11/21/2016 7:09 PM

## 2016-11-21 NOTE — Discharge Instructions (Signed)
General Headache Without Cause A headache is pain or discomfort felt around the head or neck area. The specific cause of a headache may not be found. There are many causes and types of headaches. A few common ones are:  Tension headaches.  Migraine headaches.  Cluster headaches.  Chronic daily headaches.  Follow these instructions at home: Watch your condition for any changes. Take these steps to help with your condition: Managing pain  Take over-the-counter and prescription medicines only as told by your health care provider.  Lie down in a dark, quiet room when you have a headache.  If directed, apply ice to the head and neck area: ? Put ice in a plastic bag. ? Place a towel between your skin and the bag. ? Leave the ice on for 20 minutes, 2-3 times per day.  Use a heating pad or hot shower to apply heat to the head and neck area as told by your health care provider.  Keep lights dim if bright lights bother you or make your headaches worse. Eating and drinking  Eat meals on a regular schedule.  Limit alcohol use.  Decrease the amount of caffeine you drink, or stop drinking caffeine. General instructions  Keep all follow-up visits as told by your health care provider. This is important.  Keep a headache journal to help find out what may trigger your headaches. For example, write down: ? What you eat and drink. ? How much sleep you get. ? Any change to your diet or medicines.  Try massage or other relaxation techniques.  Limit stress.  Sit up straight, and do not tense your muscles.  Do not use tobacco products, including cigarettes, chewing tobacco, or e-cigarettes. If you need help quitting, ask your health care provider.  Exercise regularly as told by your health care provider.  Sleep on a regular schedule. Get 7-9 hours of sleep, or the amount recommended by your health care provider. Contact a health care provider if:  Your symptoms are not helped by  medicine.  You have a headache that is different from the usual headache.  You have nausea or you vomit.  You have a fever. Get help right away if:  Your headache becomes severe.  You have repeated vomiting.  You have a stiff neck.  You have a loss of vision.  You have problems with speech.  You have pain in the eye or ear.  You have muscular weakness or loss of muscle control.  You lose your balance or have trouble walking.  You feel faint or pass out.  You have confusion. This information is not intended to replace advice given to you by your health care provider. Make sure you discuss any questions you have with your health care provider. Document Released: 05/10/2005 Document Revised: 10/16/2015 Document Reviewed: 09/02/2014 Elsevier Interactive Patient Education  2017 Elsevier Inc.  

## 2016-11-21 NOTE — MAU Note (Signed)
Pt presents to MAU with complaints of lower abdominal cramping, headache and a decrease in fetal movement since this morning. Denies any VB or LOF

## 2016-11-22 ENCOUNTER — Ambulatory Visit (INDEPENDENT_AMBULATORY_CARE_PROVIDER_SITE_OTHER): Payer: Medicaid Other | Admitting: Obstetrics and Gynecology

## 2016-11-22 VITALS — BP 132/70 | HR 102 | Wt 206.8 lb

## 2016-11-22 DIAGNOSIS — O3660X0 Maternal care for excessive fetal growth, unspecified trimester, not applicable or unspecified: Secondary | ICD-10-CM

## 2016-11-22 DIAGNOSIS — Z3480 Encounter for supervision of other normal pregnancy, unspecified trimester: Secondary | ICD-10-CM

## 2016-11-22 DIAGNOSIS — Z789 Other specified health status: Secondary | ICD-10-CM

## 2016-11-22 DIAGNOSIS — O99820 Streptococcus B carrier state complicating pregnancy: Secondary | ICD-10-CM

## 2016-11-22 DIAGNOSIS — O3663X0 Maternal care for excessive fetal growth, third trimester, not applicable or unspecified: Secondary | ICD-10-CM

## 2016-11-22 DIAGNOSIS — Z3483 Encounter for supervision of other normal pregnancy, third trimester: Secondary | ICD-10-CM

## 2016-11-22 NOTE — Patient Instructions (Signed)
Parto vaginal (Vaginal Delivery) Durante el parto, el mdico la ayudar a dar a luz a su beb. En elparto vaginal, deber pujar para que el beb salga por la vagina. Sin embargo, antes de que pueda sacar al beb, es necesario que ocurran ciertas cosas. La abertura del tero (cuello del tero) tiene que ablandarse, hacerse ms delgado y abrirse (dilatar) hasta que llegue a 10 cm. Adems, el beb tiene que bajar desde el tero a la vagina. SIGNOS DE TRABAJO DE PARTO  El mdico tendr primero que asegurarse de que usted est en trabajo de parto. Algunos signos son:   Eliminar lo que se llama tapn mucoso antes del inicio del trabajo de parto. Este es una pequea cantidad de mucosidad teida con sangre.  Tener contracciones uterinas regulares y dolorosas.   El tiempo entre las contracciones debe acortarse  Las molestias y el dolor se harn ms intensos gradualmente.  El dolor de las contracciones empeora al caminar y no se alivia con el reposo.   El cuello del tero se hace mas delgado (se borra) y se dilata. ANTES DEL PARTO Una vez que se inicie el trabajo de parto y sea admitida en el hospital o sanatorio, el mdico podr hacer lo siguiente:   Realizar un examen fsico.  Controlar si hay complicaciones relacionadas con el trabajo de parto.  Verificar su presin arterial, temperatura y pulso y la frecuencia cardaca (signos vitales).   Determinar si se ha roto el saco amnitico y cundo ha ocurrido.  Realizar un examen vaginal (utilizando un guante estril y un lubricante) para determinar: ? La posicin (presentacin) del beb. El beb se presenta con la cabeza primero (vertex) en el canal de parto (vagina), o estn los pies o las nalgas primero (de nalgas)? ? El nivel (estacin) de la cabeza del beb dentro del canal de parto. ? El borramiento y la dilatacin del cuello uterino  El monitor fetal electrnico generalmente se coloca sobre el abdomen al llegar. Se utiliza para  controlar las contracciones y la frecuencia cardaca del beb. ? Cuando el monitor est en el abdomen (monitor fetal externo), slo toma la frecuencia y la duracin de las contracciones. No informa acerca de la intensidad de las contracciones. ? Si el mdico necesita saber exactamente la intensidad de las contracciones o cul es la frecuencia cardaca del beb, colocar un monitor interno en la vagina y el tero. El mdico comentar los riesgos y los beneficios de usar un monitor interno y le pedir autorizacin antes de colocar el dispositivo. ? El monitoreo fetal continuo ser necesario si le han aplicado una epidural, si le administran ciertos medicamentos (como oxitocina) y si tiene complicaciones del embarazo o del trabajo de parto.  Podrn colocarle una va intravenosa en una vena del brazo para suministrarle lquidos y medicamentos, si es necesario. TRES ETAPAS DEL TRABAJO DE PARTO Y EL PARTO El trabajo de parto y el parto normales se dividen en tres etapas. Primera etapa Esta etapa comienza cuando comienzan las contracciones regulares y el cuello comienza a borrarse y dilatarse. Finaliza cuando el cuello est completamente abierto (completamente dilatado). La primera etapa es la etapa ms larga del trabajo de parto y puede durar desde 3 horas a 15 horas.  Algunos mtodos estn disponibles para ayudar con el dolor del parto. Usted y su mdico decidirn qu opcin es la mejor para usted. Las opciones incluyen:   Medicamentos narcticos. Estos son medicamentos fuertes que usted puede recibir a travs de una va intravenosa o   como inyeccin en el msculo. Estos medicamentos alivian el dolor pero no hacen que desaparezca completamente.  Epidural. Se administra un medicamento a travs de un tubo delgado que se inserta en la espalda. El medicamento adormece la parte inferior del cuerpo y evita el dolor en esa zona.  Bloqueo paracervical Es una inyeccin de un anestsico en cada lado del cuello  uterino.  Usted podr pedir un parto natural, que implica que no se usen analgsicos ni epidural durante el parto y el trabajo de parto. En cambio, podr tener otro tipo de ayuda como ejercicios respiratorios para hacer frente al dolor. Segunda etapa La segunda etapa del trabajo de parto comienza cuando el cuello se ha dilatado completamente a 10 cm. Contina hasta que usted puja al beb hacia abajo, por el canal de parto, y el beb nace. Esta etapa puede durar slo algunos minutos o algunas horas.  La posicin del la cabeza del beb a medida que pasa por el canal de parto, es informada como un nmero, llamado estacin. Si la cabeza del beb no ha iniciado su descenso, la estacin se describe como que est en menos 3 (-3). Cuando la cabeza del beb est en la estacin cero, est en el medio del canal de parto y se encaja en la pelvis. La estacin en la que se encuentra el beb indica el progreso de la segunda etapa del trabajo de parto.  Cuando el beb nace, el mdico lo sostendr con la cabeza hacia abajo para evitar que el lquido amnitico, el moco y la sangre entren en los pulmones del beb. La boca y la nariz del beb podrn ser succionadas con un pequeo bulbo para retirar todo lquido adicional.  El mdico podr colocar al beb sobre su estmago. Es importante evitar que el beb tome fro. Para hacerlo, el mdico secar al beb, lo colocar directamente sobre su piel, (sin mantas entre usted y el beb) y lo cubrir con mantas secas y tibias.  Se corta el cordn umbilical. Tercera etapa Durante la tercera etapa del trabajo de parto, el mdico sacar la placenta (alumbramiento) y se asegurar de que el sangrado est controlado. La salida de la placenta generalmente demora 5 minutos pero puede tardar hasta 30 minutos. Luego de la salida de la placenta, le darn un medicamento por va intravenosa o inyectable para ayudar a contraer el tero y controlar el sangrado. Si planea amamantar al beb,  puede intentar en este momento Luego de la salida de la placenta, el tero debe contraerse y quedar muy firme. Si el tero no queda firme, el mdico lo masajear. Esto es importante debido a que la contraccin del tero ayuda a cortar el sangrado en el sitio en que la placenta estaba unida al tero. Si el tero no se contrae adecuadamente ni permanece firme, podr causar un sangrado abundante. Si hay mucho sangrado, podrn darle medicamentos para contraer el tero y detener el sangrado.  Esta informacin no tiene como fin reemplazar el consejo del mdico. Asegrese de hacerle al mdico cualquier pregunta que tenga. Document Released: 04/22/2008 Document Revised: 05/31/2014 Elsevier Interactive Patient Education  2017 Elsevier Inc.  

## 2016-11-22 NOTE — Progress Notes (Signed)
Subjective:  Paula Gamble is a 21 y.o. G3P0020 at 811w0d being seen today for ongoing prenatal care.  She is currently monitored for the following issues for this low-risk pregnancy and has Encounter for supervision of normal pregnancy, antepartum; LGA (large for gestational age) fetus affecting management of mother; Language barrier; and GBS (group B Streptococcus carrier), +RV culture, currently pregnant on her problem list.  Patient reports no complaints.  Contractions: Irregular. Vag. Bleeding: None.  Movement: (!) Decreased. Denies leaking of fluid.   The following portions of the patient's history were reviewed and updated as appropriate: allergies, current medications, past family history, past medical history, past social history, past surgical history and problem list. Problem list updated.  Objective:   Vitals:   11/22/16 1425  BP: 132/70  Pulse: (!) 102  Weight: 206 lb 12.8 oz (93.8 kg)    Fetal Status: Fetal Heart Rate (bpm): 130   Movement: (!) Decreased     General:  Alert, oriented and cooperative. Patient is in no acute distress.  Skin: Skin is warm and dry. No rash noted.   Cardiovascular: Normal heart rate noted  Respiratory: Normal respiratory effort, no problems with respiration noted  Abdomen: Soft, gravid, appropriate for gestational age. Pain/Pressure: Present     Pelvic:  Cervical exam performed        Extremities: Normal range of motion.  Edema: Trace  Mental Status: Normal mood and affect. Normal behavior. Normal judgment and thought content.   Urinalysis:      Assessment and Plan:  Pregnancy: G3P0020 at 101w0d  1. GBS (group B Streptococcus carrier), +RV culture, currently pregnant Tx while in labor   2. Language barrier Interrupter used today  3. Excessive fetal growth affecting management of pregnancy, antepartum, single or unspecified fetus Stable  4. Supervision of other normal pregnancy, antepartum Labor precautions  Term labor  symptoms and general obstetric precautions including but not limited to vaginal bleeding, contractions, leaking of fluid and fetal movement were reviewed in detail with the patient. Please refer to After Visit Summary for other counseling recommendations.  Return in about 1 week (around 11/29/2016) for OB visit.   Hermina StaggersErvin, Lazar Tierce L, MD

## 2016-11-23 ENCOUNTER — Encounter (HOSPITAL_COMMUNITY): Payer: Self-pay | Admitting: *Deleted

## 2016-11-23 ENCOUNTER — Inpatient Hospital Stay (HOSPITAL_COMMUNITY)
Admission: AD | Admit: 2016-11-23 | Discharge: 2016-11-29 | DRG: 765 | Disposition: A | Discharge status: Home / Self Care | Payer: Medicaid Other | Source: Ambulatory Visit | Attending: Obstetrics & Gynecology | Admitting: Obstetrics & Gynecology

## 2016-11-23 ENCOUNTER — Encounter (HOSPITAL_COMMUNITY): Payer: Self-pay

## 2016-11-23 ENCOUNTER — Inpatient Hospital Stay (EMERGENCY_DEPARTMENT_HOSPITAL)
Admission: AD | Admit: 2016-11-23 | Discharge: 2016-11-23 | Disposition: A | Discharge status: Home / Self Care | Payer: Medicaid Other | Source: Ambulatory Visit | Attending: Obstetrics & Gynecology | Admitting: Obstetrics & Gynecology

## 2016-11-23 DIAGNOSIS — K567 Ileus, unspecified: Secondary | ICD-10-CM | POA: Diagnosis not present

## 2016-11-23 DIAGNOSIS — Z3A39 39 weeks gestation of pregnancy: Secondary | ICD-10-CM

## 2016-11-23 DIAGNOSIS — Z87891 Personal history of nicotine dependence: Secondary | ICD-10-CM | POA: Diagnosis not present

## 2016-11-23 DIAGNOSIS — O4292 Full-term premature rupture of membranes, unspecified as to length of time between rupture and onset of labor: Secondary | ICD-10-CM | POA: Diagnosis present

## 2016-11-23 DIAGNOSIS — O9089 Other complications of the puerperium, not elsewhere classified: Secondary | ICD-10-CM | POA: Diagnosis not present

## 2016-11-23 DIAGNOSIS — Z91040 Latex allergy status: Secondary | ICD-10-CM

## 2016-11-23 DIAGNOSIS — O9963 Diseases of the digestive system complicating the puerperium: Secondary | ICD-10-CM | POA: Diagnosis not present

## 2016-11-23 DIAGNOSIS — O3663X0 Maternal care for excessive fetal growth, third trimester, not applicable or unspecified: Secondary | ICD-10-CM

## 2016-11-23 DIAGNOSIS — R079 Chest pain, unspecified: Secondary | ICD-10-CM | POA: Diagnosis not present

## 2016-11-23 DIAGNOSIS — Z3493 Encounter for supervision of normal pregnancy, unspecified, third trimester: Secondary | ICD-10-CM | POA: Diagnosis present

## 2016-11-23 DIAGNOSIS — R0602 Shortness of breath: Secondary | ICD-10-CM | POA: Diagnosis not present

## 2016-11-23 DIAGNOSIS — O471 False labor at or after 37 completed weeks of gestation: Secondary | ICD-10-CM

## 2016-11-23 DIAGNOSIS — O99824 Streptococcus B carrier state complicating childbirth: Secondary | ICD-10-CM | POA: Diagnosis present

## 2016-11-23 DIAGNOSIS — O4290 Premature rupture of membranes, unspecified as to length of time between rupture and onset of labor, unspecified weeks of gestation: Secondary | ICD-10-CM

## 2016-11-23 DIAGNOSIS — Z3480 Encounter for supervision of other normal pregnancy, unspecified trimester: Secondary | ICD-10-CM

## 2016-11-23 DIAGNOSIS — O479 False labor, unspecified: Secondary | ICD-10-CM

## 2016-11-23 DIAGNOSIS — K5900 Constipation, unspecified: Secondary | ICD-10-CM | POA: Diagnosis not present

## 2016-11-23 DIAGNOSIS — O324XX0 Maternal care for high head at term, not applicable or unspecified: Secondary | ICD-10-CM | POA: Diagnosis present

## 2016-11-23 LAB — CBC
HCT: 34.4 % — ABNORMAL LOW (ref 36.0–46.0)
HCT: 37.5 % (ref 36.0–46.0)
Hemoglobin: 11.7 g/dL — ABNORMAL LOW (ref 12.0–15.0)
Hemoglobin: 12.6 g/dL (ref 12.0–15.0)
MCH: 32.1 pg (ref 26.0–34.0)
MCH: 32.3 pg (ref 26.0–34.0)
MCHC: 34 g/dL (ref 30.0–36.0)
MCHC: 33.6 g/dL (ref 30.0–36.0)
MCV: 94.5 fL (ref 78.0–100.0)
MCV: 96.2 fL (ref 78.0–100.0)
Platelets: 141 10*3/uL — ABNORMAL LOW (ref 150–400)
Platelets: 160 10*3/uL (ref 150–400)
RBC: 3.64 MIL/uL — ABNORMAL LOW (ref 3.87–5.11)
RBC: 3.9 MIL/uL (ref 3.87–5.11)
RDW: 13.8 % (ref 11.5–15.5)
RDW: 14.1 % (ref 11.5–15.5)
WBC: 11.8 10*3/uL — ABNORMAL HIGH (ref 4.0–10.5)
WBC: 12.6 10*3/uL — ABNORMAL HIGH (ref 4.0–10.5)

## 2016-11-23 LAB — POCT FERN TEST: POCT FERN TEST: POSITIVE

## 2016-11-23 LAB — COMPREHENSIVE METABOLIC PANEL
ALBUMIN: 3 g/dL — AB (ref 3.5–5.0)
ALT: 13 U/L — ABNORMAL LOW (ref 14–54)
ANION GAP: 9 (ref 5–15)
AST: 20 U/L (ref 15–41)
Alkaline Phosphatase: 211 U/L — ABNORMAL HIGH (ref 38–126)
BILIRUBIN TOTAL: 0.4 mg/dL (ref 0.3–1.2)
BUN: 7 mg/dL (ref 6–20)
CALCIUM: 9 mg/dL (ref 8.9–10.3)
CO2: 20 mmol/L — ABNORMAL LOW (ref 22–32)
Chloride: 109 mmol/L (ref 101–111)
Creatinine, Ser: 0.49 mg/dL (ref 0.44–1.00)
GFR calc non Af Amer: >60 mL/min (ref >60)
Glucose, Bld: 90 mg/dL (ref 65–99)
POTASSIUM: 4 mmol/L (ref 3.5–5.1)
SODIUM: 138 mmol/L (ref 135–145)
TOTAL PROTEIN: 6.5 g/dL (ref 6.5–8.1)

## 2016-11-23 LAB — TYPE AND SCREEN
ABO/RH(D): O POS
Antibody Screen: NEGATIVE

## 2016-11-23 LAB — PROTEIN / CREATININE RATIO, URINE
Creatinine, Urine: 36 mg/dL
PROTEIN CREATININE RATIO: 0.25 mg/mg{creat} — AB (ref 0.00–0.15)
TOTAL PROTEIN, URINE: 9 mg/dL

## 2016-11-23 LAB — OB RESULTS CONSOLE GBS: GBS: POSITIVE

## 2016-11-23 MED ORDER — PENICILLIN G POTASSIUM 5000000 UNITS IJ SOLR: 2.5000 10*6.[IU] | INTRAMUSCULAR | Status: DC

## 2016-11-23 MED ORDER — ACETAMINOPHEN 325 MG PO TABS
650.0000 mg | ORAL_TABLET | ORAL | Status: DC | PRN
  Administered 2016-11-23 – 2016-11-24 (×2): 650 mg via ORAL
  Filled 2016-11-23 (×2): qty 2

## 2016-11-23 MED ORDER — PENICILLIN G POTASSIUM 5000000 UNITS IJ SOLR
5.0000 10*6.[IU] | Freq: Once | INTRAMUSCULAR | Status: AC
  Administered 2016-11-23: 5 10*6.[IU] via INTRAVENOUS
  Filled 2016-11-23: qty 5

## 2016-11-23 MED ORDER — LACTATED RINGERS IV SOLN
INTRAVENOUS | Status: DC
  Administered 2016-11-23 – 2016-11-25 (×4): via INTRAVENOUS

## 2016-11-23 MED ORDER — OXYTOCIN BOLUS FROM INFUSION: 500.0000 mL | Freq: Once | INTRAVENOUS | Status: DC

## 2016-11-23 MED ORDER — OXYCODONE-ACETAMINOPHEN 5-325 MG PO TABS: 1.0000 | ORAL_TABLET | ORAL | Status: DC | PRN

## 2016-11-23 MED ORDER — PENICILLIN G POTASSIUM 5000000 UNITS IJ SOLR: 5.0000 10*6.[IU] | Freq: Once | INTRAMUSCULAR | Status: DC

## 2016-11-23 MED ORDER — LACTATED RINGERS IV SOLN: 500.0000 mL | INTRAVENOUS | Status: DC | PRN

## 2016-11-23 MED ORDER — PENICILLIN G POT IN DEXTROSE 60000 UNIT/ML IV SOLN
3.0000 10*6.[IU] | INTRAVENOUS | Status: DC
  Administered 2016-11-24 – 2016-11-25 (×7): 3 10*6.[IU] via INTRAVENOUS
  Filled 2016-11-23 (×11): qty 50

## 2016-11-23 MED ORDER — MISOPROSTOL 50MCG HALF TABLET
50.0000 ug | ORAL_TABLET | ORAL | Status: DC | PRN
  Administered 2016-11-23: 50 ug via ORAL
  Filled 2016-11-23 (×2): qty 1

## 2016-11-23 MED ORDER — LIDOCAINE HCL (PF) 1 % IJ SOLN: 30.0000 mL | INTRAMUSCULAR | Status: DC | PRN

## 2016-11-23 MED ORDER — SOD CITRATE-CITRIC ACID 500-334 MG/5ML PO SOLN
30.0000 mL | ORAL | Status: DC | PRN
  Administered 2016-11-25: 30 mL via ORAL
  Filled 2016-11-23: qty 15

## 2016-11-23 MED ORDER — TERBUTALINE SULFATE 1 MG/ML IJ SOLN: 0.2500 mg | Freq: Once | INTRAMUSCULAR | Status: DC | PRN

## 2016-11-23 MED ORDER — FLEET ENEMA 7-19 GM/118ML RE ENEM: 1.0000 | ENEMA | RECTAL | Status: DC | PRN

## 2016-11-23 MED ORDER — OXYTOCIN 40 UNITS IN LACTATED RINGERS INFUSION - SIMPLE MED
2.5000 [IU]/h | INTRAVENOUS | Status: DC
  Filled 2016-11-23: qty 1000

## 2016-11-23 MED ORDER — ACETAMINOPHEN 500 MG PO TABS
1000.0000 mg | ORAL_TABLET | Freq: Once | ORAL | Status: AC
  Administered 2016-11-23: 1000 mg via ORAL
  Filled 2016-11-23: qty 2

## 2016-11-23 MED ORDER — FENTANYL CITRATE (PF) 100 MCG/2ML IJ SOLN
50.0000 ug | INTRAMUSCULAR | Status: DC | PRN
  Administered 2016-11-23: 50 ug via INTRAVENOUS
  Administered 2016-11-24 (×3): 100 ug via INTRAVENOUS
  Filled 2016-11-23 (×4): qty 2

## 2016-11-23 MED ORDER — OXYCODONE-ACETAMINOPHEN 5-325 MG PO TABS: 2.0000 | ORAL_TABLET | ORAL | Status: DC | PRN

## 2016-11-23 MED ORDER — ONDANSETRON HCL 4 MG/2ML IJ SOLN
4.0000 mg | Freq: Four times a day (QID) | INTRAMUSCULAR | Status: DC | PRN
Start: 2016-11-23 — End: 2016-11-25
  Administered 2016-11-24: 4 mg via INTRAVENOUS
  Filled 2016-11-23: qty 2

## 2016-11-23 NOTE — Progress Notes (Signed)
Labor Progress Note Paula LentJulissa Gamble is a 21 y.o. G3P0020 at 3182w1d presented for PROM at term  S:  Feeling some ctx, declines pain meds.   O:  BP 118/63 (BP Location: Left Arm)   Pulse 88   Temp 98.1 F (36.7 C) (Oral)   Resp 18   Ht 5\' 4"  (1.626 m)   Wt 93.4 kg (206 lb)   LMP 02/23/2016   BMI 35.36 kg/m  EFM: baseline 125 bpm/ mod variability/ + accels/ no decels  Toco: 2-7 SVE: deferred Bedside US >vtx  A/P: 21 y.o. G3P0020 8082w1d  1. Labor: latent 2. FWB: Cat I 3. Pain: analgesia/anesthesia prn Start po Cytotec then Pitocin. Anticipate labor progression and SVD.  Paula LarryMelanie Vaibhav Gamble, CNM 9:57 PM

## 2016-11-23 NOTE — MAU Note (Signed)
Pt reports contractions, hip pain and HA that started 2 hours ago. Pt denies LOF or vaginal bleeding. Reports good fetal movement. Did not take anything for HA tonight.

## 2016-11-23 NOTE — Discharge Instructions (Signed)
You have constipation which is hard stools that are difficult to pass. It is important to have regular bowel movements every 1-3 days that are soft and easy to pass. Hard stools increase your risk of hemorrhoids and are very uncomfortable.   To prevent constipation you can increase the amount of fiber in your diet. Examples of foods with fiber are leafy greens, whole grain breads, oatmeal and other grains.  It is also important to drink at least eight 8oz glass of water everyday.   If you have not has a bowel movement in 4-5 days you made need to clean out your bowel.  This will have establish normal movement through your bowel.    Miralax Clean out  Take 8 capfuls of miralax in 64 oz of Gatorade. You can use any fluid that appeals to you (gatorade, water, juice)  Continue to drink at least eight 8 oz glasses of water throughout the day  You can repeat with another 8 capfuls of Miralax in 64 oz of Gatorade, if you are not having a large amount of stools  You will need to be at home and close to a bathroom for about 8 hours when you do the above as you may need to go to the bathroom frequently.   After you are cleaned out: - Start Colace100mg  twice daily - Start Miralax once daily - Start a daily fiber supplement like Metamucil or Citrucel - You can safely use enemas in pregnancy  - if you are having diarrhea, you can reduce to Colace once a day or Miralax every other day or a 1/2 capful daily.    Contracciones de Designer, multimediaBraxton Hicks (Braxton Hicks Contractions) Durante el [place]arazo, pueden presentarse contracciones uterinas que no siempre indican que est en Isla Vistatrabajo de parto. QU SON LAS CONTRACCIONES DE BRAXTON HICKS? Las State Farmcontracciones que se presentan antes del Turneytrabajo de [place]parto se conocen como contracciones de ArleyBraxton Hicks o falso trabajo de [place]o. Hacia el final del embarazo (32 a 34semanas), estas contracciones pueden aparecen con ms frecuencia y volverse ms intensas. No corresponden al  Aleen Campitrabajo de parto verdadero porque estas contracciones no producen el agrandamiento (la dilatacin) y el afinamiento del cuello del tero. Algunas veces, es difcil distinguirlas del trabajo de parto verdadero porque en algunos casos pueden ser D.R. Horton, Incmuy intensas, y las personas tienen diferentes niveles de tolerancia al [employer]. No debe sentirse avergonzada si concurre al hospital con falso trabajo de [place]o. En ocasiones, la nica forma de saber si el trabajo de parto es verdadero es que el mdico determine si hay cambios en el cuello del tero. Si no hay problemas prenatales u otras complicaciones de salud asociadas con el embarazo, no habr inconvenientes si la envan a su casa con falso trabajo de parto y espera que comience el verdadero. CMO DIFERENCIAR EL TRABAJO DE PARTO FALSO DEL VERDADERO Falso trabajo de parto  Las contracciones del falso trabajo de parto duran menos y no son tan intensas como las verdaderas.  Generalmente son irregulares.  A menudo, se sienten en la parte delantera de la parte baja del abdomen y en la ingle,  y pueden desaparecer cuando camina o cambia de posicin mientras est acostada.  Las contracciones se vuelven ms dbiles y su duracin es Adult nursemenor a medida que el tiempo transcurre.  Por lo general, no se hacen progresivamente ms intensas, regulares y Herbalistcercanas entre s como en el caso del Waconiatrabajo de parto verdadero. Theodis BlazeVerdadero trabajo de parto  Las contracciones del verdadero trabajo de [place]parto  duran de 30 a 70segundos, son muy regulares y suelen volverse ms intensas, y [place] su frecuencia.  No desaparecen cuando camina.  La molestia generalmente se siente en la parte superior del tero y se extiende hacia la zona inferior del abdomen y [employer] cintura.  El mdico podr examinarla para determinar si el trabajo de parto es verdadero. El examen mostrar si el cuello del tero se est dilatando y [place]. LO QUE DEBE RECORDAR  Contine haciendo los ejercicios  habituales y siga otras indicaciones que el mdico le d.  Tome todos los medicamentos como le indic el mdico.  Oceanographer a las visitas prenatales regulares.  Coma y beba con moderacin si cree que est en trabajo de parto.  Si las contracciones de [employer] provocan incomodidad: ? Cambie de posicin: si est acostada o descansando, camine; si est caminando, descanse. ? Sintese y descanse en una baera con agua tibia. ? Beba 2 o 3vasos de agua. La deshidratacin puede provocar contracciones. ? Respire lenta y profundamente varias veces por hora.  CUNDO DEBO BUSCAR ASISTENCIA MDICA INMEDIATA? Solicite atencin mdica de inmediato si:  Las contracciones se intensifican, se hacen ms regulares y Arboriculturist s.  Tiene una prdida de lquido por la vagina.  Tiene fiebre.  Elimina mucosidad manchada con [place].  Tiene una hemorragia vaginal abundante.  Tiene dolor abdominal permanente.  Tiene un dolor en la zona lumbar que nunca tuvo antes.  Siente que la cabeza del beb empuja hacia abajo y ejerce presin en la zona plvica.  El beb no se mueve Dentist. Esta informacin no tiene Theme park manager el consejo del mdico. Asegrese de hacerle al mdico cualquier pregunta que tenga. Document Released: 02/17/2005 Document Revised: 09/01/2015 Document Reviewed: 02/19/2013 Elsevier Interactive Patient Education  2017 Elsevier Inc.  Estreimiento - Adultos (Constipation, Adult) Se llama constipacin cuando:  Elimina heces (defeca) menos de 3 veces por semana.  Tiene dificultad para defecar.  Las heces son secas y duras o son ms grandes que lo normal. CUIDADOS EN EL HOGAR  Consuma alimentos con alto contenido de [place]. Por ejemplo, frutas, vegetales, porotos y cereales integrales, como el arroz integral.  Evite los alimentos ricos en grasas y International aid/development worker. Estos incluyen patatas fritas, hamburguesas, galletas, dulces y refrescos.  Si no consume  suficientes alimentos ricos en fibras, tome productos que tengan agregado de fibra (suplementos).  Beba suficiente lquido para mantener el pis (orina) claro o de color amarillo plido.  Haga ejercicio en forma regular, o como lo indique su mdico.  Vaya al bao cuando sienta la necesidad de Advertising copywriter. No se aguante las ganas.  Solo tome los medicamentos que le haya indicado su mdico. No tome medicamentos que le ayuden a Advertising copywriter (laxantes) sin antes consultarlo con su mdico.  SOLICITE AYUDA DE INMEDIATO SI:  Observa sangre brillante en las heces (materia fecal).  El estreimiento dura ms de 4 das o [place].  Tiene dolor en el vientre (abdominal) o el trasero (recto).  Las heces son delgadas (como un lpiz).  Pierde peso de [place] inexplicable.  ASEGRESE DE QUE:  Comprende estas instrucciones.  Controlar su afeccin.  Recibir ayuda de inmediato si no mejora o si empeora.  Esta informacin no tiene Theme park manager el consejo del mdico. Asegrese de hacerle al mdico cualquier pregunta que tenga. Document Released: 06/12/2010 Document Revised: 05/31/2014 Document Reviewed: 10/29/2015 Elsevier Interactive Patient Education  2017 [employer].

## 2016-11-23 NOTE — MAU Provider Note (Signed)
History     CSN: [id number]  Arrival date and time: 11/23/16 0119   First Provider Initiated Contact with Patient 11/23/16 0207  *Video interpreter Reuel Boom [phone]     Chief Complaint  Patient presents with  . Labor Eval   HPI  Ms. Temprence Rhines is a 21 yo G3P0020 at 39.[redacted] wks gestation presenting with complaints of contractions, H/A and constipation. She has not had a BM in 3 days, but taking Miralax.  She has not taken anything for the H/A since she was seen in MAU on 11/21/2016.  She reports her contractions started 2.5 hrs ago.  Denies VB or LOF.  She is requesting to be induced because "she can't take it anymore."  Past Medical History:  Diagnosis Date  . Pyelonephritis     Past Surgical History:  Procedure Laterality Date  . DILATION AND CURETTAGE OF UTERUS     x2    Family History  Problem Relation Age of Onset  . Diabetes Paternal Uncle   . Cancer Maternal Grandmother   . Diabetes Maternal Grandmother   . Hypertension Maternal Grandmother   . Cancer Paternal Grandmother        liver    Social History  Substance Use Topics  . Smoking status: Former Games developer  . Smokeless tobacco: Never Used  . Alcohol use No     Comment: Last drank in Oct/2017, per pt drank 20 beers     Allergies:  Allergies  Allergen Reactions  . Latex Itching    Prescriptions Prior to Admission  Medication Sig Dispense Refill Last Dose  . Prenatal Vit-Fe Fumarate-FA (PRENATAL MULTIVITAMIN) TABS tablet Take 1 tablet by mouth daily at 12 noon.   11/22/2016 at Unknown time  . polyethylene glycol (MIRALAX / GLYCOLAX) packet Take 17 g by mouth daily as needed for mild constipation. 17 each 0 Taking    Review of Systems  Constitutional: Negative.   HENT: Negative.   Eyes: Positive for photophobia.  Respiratory: Negative.   Cardiovascular: Negative.   Gastrointestinal: Positive for abdominal pain and constipation.  Genitourinary: Negative.   Musculoskeletal: Negative.   Skin:  Negative.   Neurological: Positive for headaches.  Endo/Heme/Allergies: Negative.   Psychiatric/Behavioral: Negative.    Physical Exam   Blood pressure (!) 110/57, pulse (!) 106, temperature 98.1 F (36.7 C), temperature source Oral, resp. rate 16, last menstrual period 02/23/2016, SpO2 99 %.  Physical Exam  Constitutional: She is oriented to person, place, and time. She appears well-developed and well-nourished.  Genitourinary:  Genitourinary Comments: VE by RN  HENT:  Head: Normocephalic and atraumatic.  Eyes: Pupils are equal, round, and reactive to light.  Neck: Normal range of motion.  Cardiovascular: Normal rate, regular rhythm and normal heart sounds.   Pulmonary/Chest: Effort normal and breath sounds normal.  Abdominal: Soft. Bowel sounds are normal.  Musculoskeletal: Normal range of motion.  Neurological: She is alert and oriented to person, place, and time.  Skin: Skin is warm and dry.  Psychiatric: She has a normal mood and affect. Her behavior is normal. Judgment and thought content normal.  Nursing note and vitals reviewed.   MAU Course  Procedures  MDM NST - FHR: 125 bpm / moderate variability / accels present / decels absent TOCO: regular every 4-8 mins CCUA CBC CMP P/C ratio *Consult with Dr. Debroah Loop @ 0330 re: PIH labs and H/A - ok to d/c home, F/U prn in [facility], keep appt on 7/9  Results for orders placed or performed during the  hospital encounter of 11/23/16 (from the past 24 hour(s))  CBC     Status: Abnormal   Collection Time: 11/23/16  2:34 AM  Result Value Ref Range   WBC 11.8 (H) 4.0 - 10.5 K/uL   RBC 3.64 (L) 3.87 - 5.11 MIL/uL   Hemoglobin 11.7 (L) 12.0 - 15.0 g/dL   HCT 78.234.4 (L) 95.636.0 - 21.346.0 %   MCV 94.5 78.0 - 100.0 fL   MCH 32.1 26.0 - 34.0 pg   MCHC 34.0 30.0 - 36.0 g/dL   RDW 08.613.8 57.811.5 - 46.915.5 %   Platelets 141 (L) 150 - 400 K/uL  Comprehensive metabolic panel     Status: Abnormal   Collection Time: 11/23/16  2:34 AM  Result Value  Ref Range   Sodium 138 135 - 145 mmol/L   Potassium 4.0 3.5 - 5.1 mmol/L   Chloride 109 101 - 111 mmol/L   CO2 20 (L) 22 - 32 mmol/L   Glucose, Bld 90 65 - 99 mg/dL   BUN 7 6 - 20 mg/dL   Creatinine, Ser 6.290.49 0.44 - 1.00 mg/dL   Calcium 9.0 8.9 - 52.810.3 mg/dL   Total Protein 6.5 6.5 - 8.1 g/dL   Albumin 3.0 (L) 3.5 - 5.0 g/dL   AST 20 15 - 41 U/L   ALT 13 (L) 14 - 54 U/L   Alkaline Phosphatase 211 (H) 38 - 126 U/L   Total Bilirubin 0.4 0.3 - 1.2 mg/dL   GFR calc non Af Amer >60 >60 mL/min   GFR calc Af Amer >60 >60 mL/min   Anion gap 9 5 - 15  Protein / creatinine ratio, urine     Status: Abnormal   Collection Time: 11/23/16  2:45 AM  Result Value Ref Range   Creatinine, Urine 36.00 mg/dL   Total Protein, Urine 9 mg/dL   Protein Creatinine Ratio 0.25 (H) 0.00 - 0.15 mg/mg[Cre]   Assessment and Plan  Constipation, unspecified constipation type - You have constipation which is hard stools that are difficult to pass. It is important to have regular bowel movements every 1-3 days that are soft and easy to pass. Hard stools increase your risk of hemorrhoids and are very uncomfortable.   To prevent constipation you can increase the amount of fiber in your diet. Examples of foods with fiber are leafy greens, whole grain breads, oatmeal and other grains.  It is also important to drink at least eight 8oz glass of water everyday.   If you have not has a bowel movement in 4-5 days you made need to clean out your bowel.  This will have establish normal movement through your bowel.    Miralax Clean out  Take 8 capfuls of miralax in 64 oz of gatorade. You can use any fluid that appeals to you (gatorade, water, juice)  Continue to drink at least eight 8 oz glasses of water throughout the day  You can repeat with another 8 capfuls of miralax in 64 oz of gatorade if you are not having a large amount of stools  You will need to be at home and close to a bathroom for about 8 hours when you do the  above as you may need to go to the bathroom frequently.   After you are cleaned out: - Start Colace100mg  twice daily - Start Miralax once daily - Start a daily fiber supplement like metamucil or citrucel - You can safely use enemas in pregnancy  - if you are having diarrhea  you can reduce to Colace once a day or miralax every other day or a 1/2 capful daily.    False labor - Instructions on braxton-Hicks contractions given - Advised to keep scheduled appt on 11/29/2016 -- can discuss IOL at that appt - PEC s/x discussed  Discharge home Patient verbalized an understanding of the plan of care and agrees.   Raelyn Mora, MSN, CNM 11/23/2016, 2:38 AM

## 2016-11-23 NOTE — H&P (Signed)
LABOR ADMISSION HISTORY AND PHYSICAL  Davida Falconi is a 21 y.o. female G3P0020 with IUP at [redacted]w[redacted]d by 6 week [place] presenting for SROM at 1630. She reports +FMs, no VB, has blurry vision and headache, but has not eaten anything since 4 am and thinks it is from not eating. BPs are normal. No peripheral edema or RUQ pain.  She plans on breast and bottle feeding. She requests nexplanon for birth control.  Dating: By [place] --->  Estimated Date of Delivery: 11/29/16  @[redacted]w[redacted]d , CWD, normal anatomy, cephalic presentation, 4043g, 16% EFW   Prenatal History/Complications:  Past Medical History: Past Medical History:  Diagnosis Date  . Pyelonephritis     Past Surgical History: Past Surgical History:  Procedure Laterality Date  . DILATION AND CURETTAGE OF UTERUS     x2    Obstetrical History: OB History    Gravida Para Term Preterm AB Living   3       2     SAB TAB Ectopic Multiple Live Births   2              Social History: Social History   Social History  . Marital status: Married    Spouse name: N/A  . Number of children: N/A  . Years of education: N/A   Social History Main Topics  . Smoking status: Former Games developer  . Smokeless tobacco: Never Used  . Alcohol use No     Comment: Last drank in Oct/2017, per pt drank 20 beers   . Drug use: No  . Sexual activity: Yes    Birth control/ protection: None   Other Topics Concern  . None   Social History Narrative  . None    Family History: Family History  Problem Relation Age of Onset  . Diabetes Paternal Uncle   . Cancer Maternal Grandmother   . Diabetes Maternal Grandmother   . Hypertension Maternal Grandmother   . Cancer Paternal Grandmother        liver    Allergies: Allergies  Allergen Reactions  . Latex Itching    Prescriptions Prior to Admission  Medication Sig Dispense Refill Last Dose  . polyethylene glycol (MIRALAX / GLYCOLAX) packet Take 17 g by mouth daily as needed for mild constipation. 17 each 0  Taking  . Prenatal Vit-Fe Fumarate-FA (PRENATAL MULTIVITAMIN) TABS tablet Take 1 tablet by mouth daily at 12 noon.   11/22/2016 at Unknown time     Review of Systems   All systems reviewed and negative except as stated in HPI  Last menstrual period 02/23/2016. General appearance: alert and cooperative Extremities: Homans sign is negative, no sign of DVT Presentation: cephalic Fetal monitoringBaseline: 125 bpm, Variability: Good {> 6 bpm), Accelerations: Reactive and Decelerations: Absent Uterine activityFrequency: Irregular, Every 2-6 minutes and Intensity: mild Dilation: 1 Effacement (%): 50 Station: -3 Exam by:: Exie Parody, RN   Prenatal labs: ABO, Rh: O/POS/-- (12/05 1030) Antibody: NEG (12/05 1030) Rubella: Immune RPR: Non Reactive (04/25 0819)  HBsAg: NEGATIVE (12/05 1030)  HIV: Non Reactive (04/25 0819)  GBS: Positive (07/03 0000)    Prenatal Transfer Tool  Maternal Diabetes: No Genetic Screening:  Maternal Ultrasounds/Referrals: Normal Fetal Ultrasounds or other Referrals:  None Maternal Substance Abuse:  No Significant Maternal Medications:  None Significant Maternal Lab Results: Lab values include: Group B Strep positive  Results for orders placed or performed during the hospital encounter of 11/23/16 (from the past 24 hour(s))  OB RESULT CONSOLE Group B Strep   Collection  Time: 11/23/16 12:00 AM  Result Value Ref Range   GBS Positive   POCT fern test   Collection Time: 11/23/16  5:34 PM  Result Value Ref Range   POCT Fern Test Positive = ruptured amniotic membanes   Results for orders placed or performed during the hospital encounter of 11/23/16 (from the past 24 hour(s))  CBC   Collection Time: 11/23/16  2:34 AM  Result Value Ref Range   WBC 11.8 (H) 4.0 - 10.5 K/uL   RBC 3.64 (L) 3.87 - 5.11 MIL/uL   Hemoglobin 11.7 (L) 12.0 - 15.0 g/dL   HCT 57.834.4 (L) 46.936.0 - 62.946.0 %   MCV 94.5 78.0 - 100.0 fL   MCH 32.1 26.0 - 34.0 pg   MCHC 34.0 30.0 - 36.0 g/dL    RDW 52.813.8 41.311.5 - 24.415.5 %   Platelets 141 (L) 150 - 400 K/uL  Comprehensive metabolic panel   Collection Time: 11/23/16  2:34 AM  Result Value Ref Range   Sodium 138 135 - 145 mmol/L   Potassium 4.0 3.5 - 5.1 mmol/L   Chloride 109 101 - 111 mmol/L   CO2 20 (L) 22 - 32 mmol/L   Glucose, Bld 90 65 - 99 mg/dL   BUN 7 6 - 20 mg/dL   Creatinine, Ser 0.100.49 0.44 - 1.00 mg/dL   Calcium 9.0 8.9 - 27.210.3 mg/dL   Total Protein 6.5 6.5 - 8.1 g/dL   Albumin 3.0 (L) 3.5 - 5.0 g/dL   AST 20 15 - 41 U/L   ALT 13 (L) 14 - 54 U/L   Alkaline Phosphatase 211 (H) 38 - 126 U/L   Total Bilirubin 0.4 0.3 - 1.2 mg/dL   GFR calc non Af Amer >60 >60 mL/min   GFR calc Af Amer >60 >60 mL/min   Anion gap 9 5 - 15  Protein / creatinine ratio, urine   Collection Time: 11/23/16  2:45 AM  Result Value Ref Range   Creatinine, Urine 36.00 mg/dL   Total Protein, Urine 9 mg/dL   Protein Creatinine Ratio 0.25 (H) 0.00 - 0.15 mg/mg[Cre]    Patient Active Problem List   Diagnosis Date Noted  . Language barrier 11/11/2016  . GBS (group B Streptococcus carrier), +RV culture, currently pregnant 11/11/2016  . LGA (large for gestational age) fetus affecting management of mother 10/09/2016  . Encounter for supervision of normal pregnancy, antepartum 04/16/2016    Assessment: Darryl LentJulissa Lebron-Ubiera is a 21 y.o. G3P0020 at 5576w1d here with SROM at 1630. Macrosomia with EFW >90% one week ago.    #Labor:expectant management #Pain: control #FWB: Cat 1 #ID:  GBS positive #MOF: breast #MOC:nexplanon #Circ: Unsure if inpt or outpt  SwazilandJordan Shirley, DO Family Medicine Resident PGY-1  11/23/2016, 6:25 PM   Midwife attestation: I have seen and examined this patient; I agree with above documentation in the resident's note.   Regan RakersJulissa Verlee RossettiLebron-Ubiera is a 21 y.o. G3P0020 here for PROM at term. Her pregnancy is complicated by LGA, GBS carrier, and excessive weight gain.   PE: Gen: calm comfortable, NAD Resp: normal effort, no  distress Abd: gravid  ROS, labs, PMH reviewed  Assessment/Plan: Admit to LD Labor: latent >start po Cytotec FWB: Cat I ID: GBS pos > PCN  Donette LarryMelanie Dorothe Elmore, CNM  11/23/2016, 11:12 PM

## 2016-11-24 ENCOUNTER — Inpatient Hospital Stay (HOSPITAL_COMMUNITY): Payer: Medicaid Other | Admitting: Anesthesiology

## 2016-11-24 LAB — RPR: RPR Ser Ql: NONREACTIVE

## 2016-11-24 MED ORDER — LACTATED RINGERS IV SOLN: 500.0000 mL | Freq: Once | INTRAVENOUS | Status: DC

## 2016-11-24 MED ORDER — LIDOCAINE HCL (PF) 1 % IJ SOLN
INTRAMUSCULAR | Status: DC | PRN
  Administered 2016-11-24: 3 mL
  Administered 2016-11-24: 2 mL
  Administered 2016-11-24: 5 mL

## 2016-11-24 MED ORDER — TERBUTALINE SULFATE 1 MG/ML IJ SOLN: 0.2500 mg | Freq: Once | INTRAMUSCULAR | Status: DC | PRN

## 2016-11-24 MED ORDER — EPHEDRINE 5 MG/ML INJ: 10.0000 mg | INTRAVENOUS | Status: DC | PRN

## 2016-11-24 MED ORDER — PHENYLEPHRINE 40 MCG/ML (10ML) SYRINGE FOR IV PUSH (FOR BLOOD PRESSURE SUPPORT)
80.0000 ug | PREFILLED_SYRINGE | INTRAVENOUS | Status: DC | PRN
  Filled 2016-11-24: qty 10

## 2016-11-24 MED ORDER — PHENYLEPHRINE 40 MCG/ML (10ML) SYRINGE FOR IV PUSH (FOR BLOOD PRESSURE SUPPORT): 80.0000 ug | PREFILLED_SYRINGE | INTRAVENOUS | Status: DC | PRN

## 2016-11-24 MED ORDER — EPHEDRINE 5 MG/ML INJ
10.0000 mg | INTRAVENOUS | Status: DC | PRN
Start: 2016-11-24 — End: 2016-11-25

## 2016-11-24 MED ORDER — PHENYLEPHRINE 40 MCG/ML (10ML) SYRINGE FOR IV PUSH (FOR BLOOD PRESSURE SUPPORT)
80.0000 ug | PREFILLED_SYRINGE | INTRAVENOUS | Status: DC | PRN
  Administered 2016-11-24: 80 ug via INTRAVENOUS

## 2016-11-24 MED ORDER — DIPHENHYDRAMINE HCL 50 MG/ML IJ SOLN
12.5000 mg | INTRAMUSCULAR | Status: DC | PRN
  Administered 2016-11-25 (×2): 12.5 mg via INTRAVENOUS
  Filled 2016-11-24: qty 1

## 2016-11-24 MED ORDER — LACTATED RINGERS IV SOLN
500.0000 mL | Freq: Once | INTRAVENOUS | Status: AC
  Administered 2016-11-24: 500 mL via INTRAVENOUS

## 2016-11-24 MED ORDER — FENTANYL 2.5 MCG/ML BUPIVACAINE 1/10 % EPIDURAL INFUSION (WH - ANES)
14.0000 mL/h | INTRAMUSCULAR | Status: DC | PRN
Start: 2016-11-24 — End: 2016-11-25
  Administered 2016-11-24 – 2016-11-25 (×4): 14 mL/h via EPIDURAL
  Filled 2016-11-24 (×4): qty 100

## 2016-11-24 MED ORDER — OXYTOCIN 40 UNITS IN LACTATED RINGERS INFUSION - SIMPLE MED
1.0000 m[IU]/min | INTRAVENOUS | Status: DC
  Administered 2016-11-24: 8 m[IU]/min via INTRAVENOUS
  Administered 2016-11-24: 6 m[IU]/min via INTRAVENOUS
  Administered 2016-11-24: 2 m[IU]/min via INTRAVENOUS

## 2016-11-24 NOTE — Progress Notes (Addendum)
Patient ID: [name], female   DOB: 1995/08/11, 21 y.o.   MRN: [id number] Labor Progress Note  S: Pt seen and examined with video interpreter. Pt is complaining of  HA with no visual changes. She is asking about her leg being numb. She has an epidural and is laying on the same leg.She is reassured that this is normal and her questions regarding labor progress were addressed.     O:  BP (!) 100/45   Pulse 80   Temp 98.4 F (36.9 C) (Oral)   Resp 16   Ht 5\' 4"  (1.626 m)   Wt 93.4 kg (206 lb)   LMP 02/23/2016   SpO2 99%   BMI 35.36 kg/m  FHT: 135 bpm, mod var, +accels, no decels TOCO: contractions q 3-5 min, patient looks comfortable during contractions CVE: Dilation: 3 Effacement (%): 90 Cervical Position: Middle Station: -2 Presentation: Vertex Exam by:: Fabian NovemberM. Bhambri, CNM   A&P: 21 y.o. G3P0020 1536w2d   Fore-bag ruptured with moderately stained meconium fluid. Tylenol given for headache Currently on pitocin 24milli-units/min Continue expectant management Anticipate SVD  [place]azilandJordan J Shirley, DO 10:27 AM   I spoke with and examined patient and agree with resident/PA/SNM's note and plan of care.  Admitted w/ PROM 7/3 @ 1630, has received cytotec, now on pitocin @ 184mu/min. Has mild headache, bp's normal. Temp 100.0. Cx 4/ 90/-2, vtx w/ forebag. AROM forebag mod MSF. Cat 1 FHR. UCs q 3-55mins. EFW by Leopold's app 9lbs, pelvis feels adequate.  Continue increasing pitocin per protocol to achieve adequate labor Expect NSVB Cheral MarkerKimberly R. Abhijot Straughter, CNM, [facility] IncWHNP-BC 11/24/2016 11:20 AM

## 2016-11-24 NOTE — Progress Notes (Signed)
S: G3P0020 at 3764w2d admitted for PROM seen & examined for progress of labor. Patient comfortable with epidural  Dilation: 5.5 Effacement (%): 90 Cervical Position: Middle Station: 0 Presentation: Vertex Exam by:: Dr Mosetta PuttFeng    FHT: 120 bpm, mod var, +accels, no decels TOCO: irregular q1-5952m   A/P: Labor: continue to inc pit per protocol. IUPC in place Pain: epidural FWB: category I tracing   Continue expectant management Anticipate SVD  Howard PouchLauren Zayaan Kozak, MD PGY-2 [facility]

## 2016-11-24 NOTE — Progress Notes (Signed)
Labor Progress Note Paula LentJulissa Gamble is a 21 y.o. G3P0020 at 5315w2d presented for PROM  S:  Comfortable with epidural, resting.  O:  BP (!) 91/53   Pulse 79   Temp 98.1 F (36.7 C) (Oral)   Resp 20   Ht 5\' 4"  (1.626 m)   Wt 93.4 kg (206 lb)   LMP 02/23/2016   SpO2 99%   BMI 35.36 kg/m  EFM: baseline 125 bpm/ mod variability/ + accels/ no decels  Toco: 1-7 SVE: Dilation: 3 Effacement (%): 90 Cervical Position: Middle Station: -2 Presentation: Vertex Exam by:: Fabian NovemberM. Richerd Grime, CNM   A/P: 21 y.o. G3P0020 3315w2d  1. Labor: latent 2. FWB: Cat I 3. Pain: epidural Start Pitocin. Anticipate labor progression and SVD.  Donette LarryMelanie Elward Nocera, CNM 6:22 AM

## 2016-11-24 NOTE — Anesthesia Procedure Notes (Signed)
Epidural Patient location during procedure: OB Start time: 11/24/2016 5:00 AM End time: 11/24/2016 5:13 AM  Staffing Anesthesiologist: Marcene DuosFITZGERALD, Morayma [place] Performed: anesthesiologist   Preanesthetic Checklist Completed: patient identified, site marked, surgical consent, pre-op evaluation, timeout performed, IV checked, risks and benefits discussed and monitors and equipment checked  Epidural Patient position: sitting Prep: site prepped and draped and DuraPrep Patient monitoring: continuous pulse ox and blood pressure Approach: midline Location: L3-L4 Injection technique: LOR air  Needle:  Needle type: Tuohy  Needle gauge: 17 G Needle length: 9 cm and 9 Needle insertion depth: 8 cm Catheter type: closed end flexible Catheter size: 19 Gauge Catheter at skin depth: 16 cm Test dose: negative  Assessment Events: blood not aspirated, injection not painful, no injection resistance, negative IV test and no paresthesia

## 2016-11-24 NOTE — Anesthesia Preprocedure Evaluation (Signed)
Anesthesia Evaluation  Patient identified by MRN, date of birth, ID band Patient awake    Reviewed: Allergy & Precautions, Patient's Chart, lab work & pertinent test results  Airway Mallampati: II  TM Distance: >3 FB     Dental   Pulmonary former smoker,    Pulmonary exam normal        Cardiovascular negative cardio ROS Normal cardiovascular exam     Neuro/Psych negative neurological ROS     GI/Hepatic negative GI ROS, Neg liver ROS,   Endo/Other  negative endocrine ROS  Renal/GU negative Renal ROS     Musculoskeletal   Abdominal   Peds  Hematology negative hematology ROS (+)   Anesthesia Other Findings   Reproductive/Obstetrics (+) Pregnancy                             Lab Results  Component Value Date   WBC 12.6 (H) 11/23/2016   HGB 12.6 11/23/2016   HCT 37.5 11/23/2016   MCV 96.2 11/23/2016   PLT 160 11/23/2016   Lab Results  Component Value Date   CREATININE 0.49 11/23/2016   BUN 7 11/23/2016   NA 138 11/23/2016   K 4.0 11/23/2016   CL 109 11/23/2016   CO2 20 (L) 11/23/2016    Anesthesia Physical Anesthesia Plan  ASA: II  Anesthesia Plan: Epidural   Post-op Pain Management:    Induction:   PONV Risk Score and Plan: Treatment may vary due to age or medical condition  Airway Management Planned: Natural Airway  Additional Equipment:   Intra-op Plan:   Post-operative Plan:   Informed Consent: I have reviewed the patients History and Physical, chart, labs and discussed the procedure including the risks, benefits and alternatives for the proposed anesthesia with the patient or authorized representative who has indicated his/her understanding and acceptance.     Plan Discussed with:   Anesthesia Plan Comments:         Anesthesia Quick Evaluation

## 2016-11-24 NOTE — Progress Notes (Signed)
Patient ID: Paula LentJulissa Gamble, female   DOB: Jun 29, 1995, 21 y.o.   MRN: [id number] Paula LentJulissa Gamble is a 21 y.o. G3P0020 at 3235w2d admitted for PROM  Subjective: Doing well, comfortable  Objective: BP 102/64   Pulse 87   Temp 98.7 F (37.1 C) (Oral)   Resp 16   Ht 5\' 4"  (1.626 m)   Wt 93.4 kg (206 lb)   LMP 02/23/2016   SpO2 99%   BMI 35.36 kg/m  Total I/O In: -  Out: 1750 [Urine:1750]  FHT:  FHR: 125 bpm, variability: moderate,  accelerations:  Present,  decelerations:  Present earlies UC:   regular, every 2-4 minutes  SVE:   Dilation: 5.5 Effacement (%): 90 Station: -1 Exam by:: kbooker, cnm IUPC inserted w/o difficulty  Pitocin @ 18 mu/min  Labs: Lab Results  Component Value Date   WBC 12.6 (H) 11/23/2016   HGB 12.6 11/23/2016   HCT 37.5 11/23/2016   MCV 96.2 11/23/2016   PLT 160 11/23/2016    Assessment / Plan: Spontaneous labor, progressing normally  Labor: Progressing normally Fetal Wellbeing:  Category I Pain Control:  Epidural Pre-eclampsia: n/a I/D:  pcn for gbs+ Anticipated MOD:  NSVD  Marge DuncansBooker, Kimberly Randall CNM, WHNP-BC 11/24/2016, 6:39 PM

## 2016-11-24 NOTE — Anesthesia Pain Management Evaluation Note (Signed)
  CRNA Pain Management Visit Note  Patient: Paula LentJulissa Gamble, 21 y.o., female  "Hello I am a member of the anesthesia team at [facility]. We have an anesthesia team available at all times to provide care throughout the hospital, including epidural management and anesthesia for C-section. I don't know your plan for the delivery whether it a natural birth, water birth, IV sedation, nitrous supplementation, doula or epidural, but we want to meet your pain goals."   1.Was your pain managed to your expectations on prior hospitalizations?   No prior hospitalizations  2.What is your expectation for pain management during this hospitalization?     Epidural  3.How can we help you reach that goal? Epidural in place.  Record the patient's initial score and the patient's pain goal.   Pain: 0  Pain Goal: 10 prior to epidural placement.  Spanish interpreter, Eta, at bedside during interview. All questioned answered. Patient has headache; Eta said she will relay this information to L&D RN.  [facility] wants you to be able to say your pain was always managed very well.  Tymira Horkey L 11/24/2016

## 2016-11-24 NOTE — Progress Notes (Signed)
This note also relates to the following rows which could not be included: Dose (milli-units/min) Oxytocin - Cannot attach notes to extension rows Rate (mL/hr) Oxytocin - Cannot attach notes to extension rows Concentration Oxytocin - Cannot attach notes to extension rows  Pitocin had been previously documented @ 758milli-units by previous nurse but was not increased on the pump from 6mu to 8mu.  I increased pitocin at this time (1508) to 8mu.

## 2016-11-24 NOTE — Progress Notes (Signed)
I pad interpreter used

## 2016-11-25 ENCOUNTER — Encounter (HOSPITAL_COMMUNITY): Payer: Self-pay | Admitting: Anesthesiology

## 2016-11-25 ENCOUNTER — Encounter (HOSPITAL_COMMUNITY)
Admission: AD | Disposition: A | Discharge status: Home / Self Care | Payer: Self-pay | Source: Ambulatory Visit | Attending: Obstetrics & Gynecology

## 2016-11-25 DIAGNOSIS — Z3A39 39 weeks gestation of pregnancy: Secondary | ICD-10-CM

## 2016-11-25 DIAGNOSIS — O3663X0 Maternal care for excessive fetal growth, third trimester, not applicable or unspecified: Secondary | ICD-10-CM

## 2016-11-25 SURGERY — Surgical Case
Anesthesia: Regional

## 2016-11-25 MED ORDER — OXYCODONE-ACETAMINOPHEN 5-325 MG PO TABS
1.0000 | ORAL_TABLET | ORAL | Status: DC | PRN
  Administered 2016-11-25 – 2016-11-29 (×9): 1 via ORAL
  Filled 2016-11-25 (×10): qty 1

## 2016-11-25 MED ORDER — SENNOSIDES-DOCUSATE SODIUM 8.6-50 MG PO TABS
2.0000 | ORAL_TABLET | ORAL | Status: DC
  Administered 2016-11-25 – 2016-11-26 (×2): 2 via ORAL
  Filled 2016-11-25 (×4): qty 2

## 2016-11-25 MED ORDER — PROMETHAZINE HCL 25 MG/ML IJ SOLN: 6.2500 mg | INTRAMUSCULAR | Status: DC | PRN

## 2016-11-25 MED ORDER — PHENYLEPHRINE HCL 10 MG/ML IJ SOLN
INTRAMUSCULAR | Status: DC | PRN
  Administered 2016-11-25 (×2): 80 ug via INTRAVENOUS

## 2016-11-25 MED ORDER — OXYTOCIN 10 UNIT/ML IJ SOLN
INTRAMUSCULAR | Status: AC
  Filled 2016-11-25: qty 4

## 2016-11-25 MED ORDER — TETANUS-DIPHTH-ACELL PERTUSSIS 5-2.5-18.5 LF-MCG/0.5 IM SUSP
0.5000 mL | Freq: Once | INTRAMUSCULAR | Status: DC
  Filled 2016-11-25: qty 0.5

## 2016-11-25 MED ORDER — KETOROLAC TROMETHAMINE 60 MG/2ML IM SOLN
30.0000 mg | Freq: Once | INTRAMUSCULAR | Status: AC
  Administered 2016-11-25: 30 mg via INTRAMUSCULAR

## 2016-11-25 MED ORDER — PRENATAL MULTIVITAMIN CH
1.0000 | ORAL_TABLET | Freq: Every day | ORAL | Status: DC
  Administered 2016-11-25 – 2016-11-29 (×5): 1 via ORAL
  Filled 2016-11-25 (×7): qty 1

## 2016-11-25 MED ORDER — SIMETHICONE 80 MG PO CHEW
80.0000 mg | CHEWABLE_TABLET | ORAL | Status: DC
  Administered 2016-11-25 – 2016-11-28 (×4): 80 mg via ORAL
  Filled 2016-11-25 (×6): qty 1

## 2016-11-25 MED ORDER — SCOPOLAMINE 1 MG/3DAYS TD PT72
MEDICATED_PATCH | TRANSDERMAL | Status: DC | PRN
  Administered 2016-11-25: 1 via TRANSDERMAL

## 2016-11-25 MED ORDER — METHYLERGONOVINE MALEATE 0.2 MG PO TABS: 0.2000 mg | ORAL_TABLET | ORAL | Status: DC | PRN

## 2016-11-25 MED ORDER — ACETAMINOPHEN 325 MG PO TABS: 650.0000 mg | ORAL_TABLET | ORAL | Status: DC | PRN

## 2016-11-25 MED ORDER — LACTATED RINGERS IV SOLN
INTRAVENOUS | Status: DC | PRN
  Administered 2016-11-25: 05:00:00 via INTRAVENOUS

## 2016-11-25 MED ORDER — OXYTOCIN 40 UNITS IN LACTATED RINGERS INFUSION - SIMPLE MED: 2.5000 [IU]/h | INTRAVENOUS | Status: AC

## 2016-11-25 MED ORDER — OXYCODONE HCL 5 MG PO TABS: 5.0000 mg | ORAL_TABLET | Freq: Once | ORAL | Status: DC | PRN

## 2016-11-25 MED ORDER — SODIUM CHLORIDE 0.9 % IR SOLN
Status: DC | PRN
  Administered 2016-11-25: 1

## 2016-11-25 MED ORDER — OXYCODONE HCL 5 MG/5ML PO SOLN: 5.0000 mg | Freq: Once | ORAL | Status: DC | PRN

## 2016-11-25 MED ORDER — IBUPROFEN 600 MG PO TABS
600.0000 mg | ORAL_TABLET | Freq: Four times a day (QID) | ORAL | Status: DC
  Administered 2016-11-25 – 2016-11-29 (×15): 600 mg via ORAL
  Filled 2016-11-25 (×16): qty 1

## 2016-11-25 MED ORDER — SCOPOLAMINE 1 MG/3DAYS TD PT72
MEDICATED_PATCH | TRANSDERMAL | Status: AC
  Filled 2016-11-25: qty 1

## 2016-11-25 MED ORDER — SIMETHICONE 80 MG PO CHEW
80.0000 mg | CHEWABLE_TABLET | Freq: Three times a day (TID) | ORAL | Status: DC
  Administered 2016-11-25 – 2016-11-29 (×9): 80 mg via ORAL
  Filled 2016-11-25 (×16): qty 1

## 2016-11-25 MED ORDER — HYDROMORPHONE HCL 1 MG/ML IJ SOLN
INTRAMUSCULAR | Status: AC
  Filled 2016-11-25: qty 0.5

## 2016-11-25 MED ORDER — DIPHENHYDRAMINE HCL 25 MG PO CAPS
25.0000 mg | ORAL_CAPSULE | Freq: Four times a day (QID) | ORAL | Status: DC | PRN
  Administered 2016-11-25 – 2016-11-26 (×2): 25 mg via ORAL
  Filled 2016-11-25 (×3): qty 1

## 2016-11-25 MED ORDER — LIDOCAINE-EPINEPHRINE (PF) 2 %-1:200000 IJ SOLN
INTRAMUSCULAR | Status: AC
  Filled 2016-11-25: qty 40

## 2016-11-25 MED ORDER — SODIUM BICARBONATE 8.4 % IV SOLN
INTRAVENOUS | Status: DC | PRN
  Administered 2016-11-25 (×3): 5 mL via EPIDURAL

## 2016-11-25 MED ORDER — MORPHINE SULFATE (PF) 0.5 MG/ML IJ SOLN
INTRAMUSCULAR | Status: AC
  Filled 2016-11-25: qty 10

## 2016-11-25 MED ORDER — DIBUCAINE 1 % RE OINT
1.0000 "application " | TOPICAL_OINTMENT | RECTAL | Status: DC | PRN
  Filled 2016-11-25: qty 28

## 2016-11-25 MED ORDER — OXYCODONE-ACETAMINOPHEN 5-325 MG PO TABS
2.0000 | ORAL_TABLET | ORAL | Status: DC | PRN
  Administered 2016-11-26 – 2016-11-29 (×6): 2 via ORAL
  Filled 2016-11-25 (×6): qty 2

## 2016-11-25 MED ORDER — MORPHINE SULFATE (PF) 0.5 MG/ML IJ SOLN
INTRAMUSCULAR | Status: DC | PRN
  Administered 2016-11-25: 4 mg via EPIDURAL
  Administered 2016-11-25: 1 mg via INTRAVENOUS

## 2016-11-25 MED ORDER — HYDROMORPHONE HCL 1 MG/ML IJ SOLN
0.2500 mg | INTRAMUSCULAR | Status: DC | PRN
  Administered 2016-11-25 (×3): 0.5 mg via INTRAVENOUS

## 2016-11-25 MED ORDER — CEFAZOLIN SODIUM-DEXTROSE 2-3 GM-% IV SOLR
INTRAVENOUS | Status: DC | PRN
  Administered 2016-11-25: 2 g via INTRAVENOUS

## 2016-11-25 MED ORDER — COCONUT OIL OIL
1.0000 "application " | TOPICAL_OIL | Status: DC | PRN
  Filled 2016-11-25: qty 120

## 2016-11-25 MED ORDER — SIMETHICONE 80 MG PO CHEW
80.0000 mg | CHEWABLE_TABLET | ORAL | Status: DC | PRN
  Administered 2016-11-28 – 2016-11-29 (×2): 80 mg via ORAL
  Filled 2016-11-25 (×3): qty 1

## 2016-11-25 MED ORDER — MENTHOL 3 MG MT LOZG
1.0000 | LOZENGE | OROMUCOSAL | Status: DC | PRN
  Administered 2016-11-28: 3 mg via ORAL
  Filled 2016-11-25 (×2): qty 9

## 2016-11-25 MED ORDER — LACTATED RINGERS IV SOLN
INTRAVENOUS | Status: DC | PRN
  Administered 2016-11-25: 40 [IU] via INTRAVENOUS

## 2016-11-25 MED ORDER — CEFAZOLIN SODIUM-DEXTROSE 2-4 GM/100ML-% IV SOLN
INTRAVENOUS | Status: AC
  Filled 2016-11-25: qty 100

## 2016-11-25 MED ORDER — MEPERIDINE HCL 25 MG/ML IJ SOLN
INTRAMUSCULAR | Status: AC
  Filled 2016-11-25: qty 1

## 2016-11-25 MED ORDER — LACTATED RINGERS IV SOLN
INTRAVENOUS | Status: DC
  Administered 2016-11-25 (×2): via INTRAVENOUS

## 2016-11-25 MED ORDER — METHYLERGONOVINE MALEATE 0.2 MG/ML IJ SOLN: 0.2000 mg | INTRAMUSCULAR | Status: DC | PRN

## 2016-11-25 MED ORDER — ZOLPIDEM TARTRATE 5 MG PO TABS: 5.0000 mg | ORAL_TABLET | Freq: Every evening | ORAL | Status: DC | PRN

## 2016-11-25 MED ORDER — MEPERIDINE HCL 25 MG/ML IJ SOLN
INTRAMUSCULAR | Status: DC | PRN
  Administered 2016-11-25 (×2): 12.5 mg via INTRAVENOUS

## 2016-11-25 MED ORDER — WITCH HAZEL-GLYCERIN EX PADS: 1.0000 "application " | MEDICATED_PAD | CUTANEOUS | Status: DC | PRN

## 2016-11-25 MED ORDER — KETOROLAC TROMETHAMINE 30 MG/ML IJ SOLN
INTRAMUSCULAR | Status: AC
  Filled 2016-11-25: qty 1

## 2016-11-25 MED ORDER — HYDROMORPHONE HCL 1 MG/ML IJ SOLN
INTRAMUSCULAR | Status: AC
  Administered 2016-11-25: 0.5 mg
  Filled 2016-11-25: qty 1

## 2016-11-25 MED ORDER — ONDANSETRON HCL 4 MG/2ML IJ SOLN
INTRAMUSCULAR | Status: DC | PRN
  Administered 2016-11-25: 4 mg via INTRAVENOUS

## 2016-11-25 MED ORDER — ONDANSETRON HCL 4 MG/2ML IJ SOLN
INTRAMUSCULAR | Status: AC
  Filled 2016-11-25: qty 2

## 2016-11-25 SURGICAL SUPPLY — 36 items

## 2016-11-25 NOTE — Anesthesia Postprocedure Evaluation (Signed)
Anesthesia Post Note  Patient: Paula LentJulissa Gamble  Procedure(s) Performed: Procedure(s) (LRB): CESAREAN SECTION (N/A)     Patient location during evaluation: Mother Baby Anesthesia Type: Epidural Level of consciousness: awake Pain management: pain level controlled Vital Signs Assessment: post-procedure vital signs reviewed and stable Respiratory status: spontaneous breathing Cardiovascular status: stable Postop Assessment: no headache, no backache, epidural receding, patient able to bend at knees, no signs of nausea or vomiting and adequate PO intake Anesthetic complications: no    Last Vitals:  Vitals:   11/25/16 1010 11/25/16 1115  BP: (!) 109/49 (!) 103/46  Pulse: 74 70  Resp: (!) 21 20  Temp: 36.7 C 37.1 C    Last Pain:  Vitals:   11/25/16 1115  TempSrc: Oral  PainSc: 0-No pain   Pain Goal:                 Azani Brogdon

## 2016-11-25 NOTE — Progress Notes (Signed)
Live in house interpreter called to pt's room to review education and assess patient. Interpreter reviewed pain management, how to call nurse for needs, using the bathroom every 3-4 hours, walking to control pain, and feeding intervention with infant. Interpreter thoroughly explained each topic and patient then verbalized understanding. Will continue to use interpreter throughout night and address concerns as needed

## 2016-11-25 NOTE — Progress Notes (Signed)
Provider at bedside to discuss risk and benefits of cesarean delivery. Possible complications discussed. Pt and family states understanding of procedure, complications, risks and benefits. Consent for c-section delivery signed and verified. Pt prepped for OR.

## 2016-11-25 NOTE — Progress Notes (Signed)
Foley D/C @1600  on today by Runner, broadcasting/film/videostudent RN.  Foley catheter was not entered to D/C in flow sheets.

## 2016-11-25 NOTE — Addendum Note (Signed)
Addendum  created 11/25/16 1317 by Renford DillsMullins, Kimberley Dastrup L, CRNA   Sign clinical note

## 2016-11-25 NOTE — Transfer of Care (Signed)
Immediate Anesthesia Transfer of Care Note  Patient: Paula LentJulissa Gamble  Procedure(s) Performed: Procedure(s): CESAREAN SECTION (N/A)  Patient Location: PACU  Anesthesia Type:Epidural  Level of Consciousness: awake, alert , oriented and patient cooperative  Airway & Oxygen Therapy: Patient Spontanous Breathing  Post-op Assessment: Report given to RN and Post -op Vital signs reviewed and stable  Post vital signs: Reviewed and stable  Last Vitals:  Vitals:   11/25/16 0247 11/25/16 0330  BP: 109/73 (!) 119/53  Pulse: (!) 103 94  Resp: 18 18  Temp:      Last Pain:  Vitals:   11/25/16 0130  TempSrc:   PainSc: Asleep         Complications: No apparent anesthesia complications

## 2016-11-25 NOTE — Anesthesia Postprocedure Evaluation (Signed)
Anesthesia Post Note  Patient: Paula LentJulissa Lebron-Ubiera  Procedure(s) Performed: Procedure(s) (LRB): CESAREAN SECTION (N/A)     Patient location during evaluation: Mother Baby Anesthesia Type: Epidural Level of consciousness: awake and alert Pain management: pain level controlled Vital Signs Assessment: post-procedure vital signs reviewed and stable Respiratory status: spontaneous breathing, nonlabored ventilation and respiratory function stable Cardiovascular status: stable Postop Assessment: no headache, no backache and epidural receding Anesthetic complications: no    Last Vitals:  Vitals:   11/25/16 0530 11/25/16 0545  BP: (!) 101/53 114/71  Pulse: (!) 103 84  Resp: 16 14  Temp:  37.3 C    Last Pain:  Vitals:   11/25/16 0600  TempSrc:   PainSc: 10-Worst pain ever   Pain Goal:                 Lowella CurbWarren Ray Zaria Taha

## 2016-11-25 NOTE — Lactation Note (Signed)
This note was copied from a baby's chart. Lactation Consultation Note  [employer]a Interpreter used for [place]. Baby 15 hours old P1.  Mother plans to do breast and formula. Baby sucking on Pacifier. Pacifier use not recommended at this time.  Reviewed hand expression. Drops expressed bilaterally. Spoon fed baby approx 4 ml of colostrum. Assisted with latching baby in football hold. Repositioned mother's hand from scissor hold to supporting breast underneath. With Tasha's RN assistance, had mother compress breast during feeding. Reviewed how to [place] baby. Discussed basics. Mom encouraged to feed baby 8-12 times/24 hours and with feeding cues.  Mom made aware of O/P services, breastfeeding support groups, community resources, and our phone # for post-discharge questions.  Mother will need positioning review and bf encouragement. Reviewed supply and demand.   Patient Name: Paula Gamble JYNWG'NToday's Date: 11/25/2016 Reason for consult: Initial assessment   Maternal Data Has patient been taught Hand Expression?: Yes Does the patient have breastfeeding experience prior to this delivery?: No  Feeding Feeding Type: Breast Fed  LATCH Score/Interventions Latch: Repeated attempts needed to sustain latch, nipple held in mouth throughout feeding, stimulation needed to elicit sucking reflex. Intervention(s): Adjust position;Assist with latch;Breast massage;Breast compression  Audible Swallowing: A few with stimulation Intervention(s): Skin to skin;Hand expression;Alternate breast massage  Type of Nipple: Everted at rest and after stimulation  Comfort (Breast/Nipple): Soft / non-tender     Hold (Positioning): Assistance needed to correctly position infant at breast and maintain latch.  LATCH Score: 7  Lactation Tools Discussed/Used     Consult Status Consult Status: Follow-up Date: 11/26/16 Follow-up type: In-patient    Dahlia ByesBerkelhammer, Lil Lepage [facility] 11/25/2016, 8:07  PM

## 2016-11-25 NOTE — Op Note (Signed)
Preoperative diagnosis:  1.  Intrauterine pregnancy at 2764w3d  weeks gestation                                         2.  Secondary arrest of descent and dilatation in the first stage of labor(5.5 cm)                                         3.  Estimated fetal weight 4000 g with abdominal circumference measured at the 97th percentile   Postoperative diagnosis:  Same as above plus asynclitism  Procedure:  Primary cesarean section  Surgeon:  Lazaro ArmsLuther H Jeren Dufrane MD  Assistant:    Anesthesia: Epidural  Findings:  .    Over a low transverse incision was delivered a viable female with Apgars of 8 and 9 weighing pending lbs.  oz. Uterus, tubes and ovaries were all normal.  There were no other significant findings  Description of operation:  Patient was taken to the operating room and placed in the sitting position where she underwent a spinal anesthetic. She was then placed in the supine position with tilt to the left side. When adequate anesthetic level was obtained she was prepped and draped in usual sterile fashion and a Foley catheter was placed. A Pfannenstiel skin incision was made and carried down sharply to the rectus fascia which was scored in the midline extended laterally. The fascia was taken off the muscles both superiorly and without difficulty. The muscles were divided.  The peritoneal cavity was entered.  Bladder blade was placed, no bladder flap was created.  A low transverse hysterotomy incision was made and delivered a viable female  infant at [id number] with Apgars of 8 and 9 weighingpending lbs  oz.  Cord pH was obtained and was penidng. The uterus was exteriorized. It was closed in 2 layers, the first being a running interlocking layer and the second being an imbricating layer using 0 monocryl on a CTX needle. There was good resulting hemostasis. The uterus tubes and ovaries were all normal. Peritoneal cavity was irrigated vigorously. The muscles and peritoneum were reapproximated loosely. The  fascia was closed using 0 Vicryl in running fashion. Subcutaneous tissue was made hemostatic and irrigated. The skin was closed using 4-0 Vicryl on a Keith needle in a subcuticular fashion.  Dermabond was placed for additional wound integrity and to serve as a barrier. Blood loss for the procedure was 500 cc. The patient received a gram of Ancef prophylactically. The patient was taken to the recovery room in good stable condition with all counts being correct x3.  EBL 500 cc  Moxie Kalil H 11/25/2016 5:00 AM

## 2016-11-25 NOTE — Progress Notes (Signed)
Patient ID: Darryl LentJulissa Lebron-Ubiera, female   DOB: [birth year], 21 y.o.   MRN: [id number]  Feeling vag pressure w/ ctx; uncomfortable  VSS, afeb FHR 125-135, +accels, no decels Ctx q 4 mins w/ Pit @ 3024mu/min; adequate MVUs since 2000 Cx unchanged for the most part since 1830 (6/80/thicker on R/-2)  IUP@term  Prolonged ROM Protracted active labor  Asked Dr Despina HiddenEure to come and eval for C/S  Cam HaiSHAW, Maresha Anastos CNM 11/25/2016 3:54 AM

## 2016-11-26 LAB — CBC
HEMATOCRIT: 29.5 % — AB (ref 36.0–46.0)
Hemoglobin: 10.2 g/dL — ABNORMAL LOW (ref 12.0–15.0)
MCH: 33.1 pg (ref 26.0–34.0)
MCHC: 34.6 g/dL (ref 30.0–36.0)
MCV: 95.8 fL (ref 78.0–100.0)
Platelets: 121 10*3/uL — ABNORMAL LOW (ref 150–400)
RBC: 3.08 MIL/uL — ABNORMAL LOW (ref 3.87–5.11)
RDW: 14.2 % (ref 11.5–15.5)
WBC: 11.9 10*3/uL — AB (ref 4.0–10.5)

## 2016-11-26 MED ORDER — OXYCODONE HCL 5 MG PO TABS
10.0000 mg | ORAL_TABLET | ORAL | Status: DC | PRN
  Administered 2016-11-26 – 2016-11-28 (×2): 10 mg via ORAL
  Filled 2016-11-26 (×2): qty 2

## 2016-11-26 NOTE — Lactation Note (Signed)
This note was copied from a baby's chart. Lactation Consultation Note  Patient Name: Paula Darryl LentJulissa Lebron-Ubiera EAVWU'JToday's Date: 11/26/2016 Reason for consult: Follow-up assessment   Follow up with mom of 34 hour old infant. Infant with 5 Bf for 10-20 minutes, EBM x 1 of 4 cc, 2 voids and 2 stools in last 24 hours. LATCH scores 7-8. Infant weight 8 lb 4.1 oz with 4% weight loss since birth. Infant currently asleep in crib.   Mom reports her milk is coming in, She report she does not have difficulty latching infant. She denies nipple pain.   Mom reports she is only giving formula when she is in a lot of pain.   Mom without questions/concerns at this time.    Maternal Data Formula Feeding for Exclusion: No  Feeding    LATCH Score/Interventions                      Lactation Tools Discussed/Used     Consult Status Consult Status: Follow-up Date: 11/27/16 Follow-up type: In-patient    Silas FloodSharon S Yazmin Locher 11/26/2016, 4:19 PM

## 2016-11-26 NOTE — Progress Notes (Signed)
POSTPARTUM PROGRESS NOTE  Post Partum Day 1 Subjective:  Paula Gamble is a 21 y.o. [id number] 7183w3d s/p pLTCS for FTP. Interpreter present during this encounter. No acute events overnight. Pt denies problems with ambulating, voiding or po intake. She is having some lower abdominal pain at her incision. She denies nausea or vomiting.  Pain is moderately controlled.  She has had flatus. She has not had bowel movement.  Lochia Small. She complains of generalized itching, which she attributes to the anesthesia.  She plans on bottle and breast feeding. Requests Nexplanon for contraception.  Objective: Blood pressure 114/62, pulse 68, temperature 98.6 F (37 C), temperature source Oral, resp. rate 18, height 5\' 4"  (1.626 m), weight 93.4 kg (206 lb), last menstrual period 02/23/2016, SpO2 96 %, unknown if currently breastfeeding.  Physical Exam:  General: Alert, cooperative and no distress Lochia: Normal flow Uterine Fundus: Firm DVT Evaluation: No calf swelling or tenderness Extremities: No edema   Recent Labs  11/23/16 1810 11/26/16 0510  HGB 12.6 10.2*  HCT 37.5 29.5*    Assessment/Plan:  ASSESSMENT: Paula Gamble is a 21 y.o. A2Z3086G3P1021 5183w3d s/p C/S. Patient overall continues to improve.  Continue pain management with Tylenol. Anticipate discharge tomorrow.    Jeanie CooksSarah Chen, Medical Student   I confirm that I have verified the information documented in the student's note and that I have also personally reperformed the physical exam and all medical decision making activities.  Luna KitchensKathryn Kooistra CNM

## 2016-11-27 ENCOUNTER — Inpatient Hospital Stay (HOSPITAL_COMMUNITY): Payer: Medicaid Other

## 2016-11-27 MED ORDER — BISACODYL 10 MG RE SUPP
10.0000 mg | Freq: Every day | RECTAL | Status: DC | PRN
  Administered 2016-11-27: 10 mg via RECTAL
  Filled 2016-11-27: qty 1

## 2016-11-27 MED ORDER — SENNOSIDES-DOCUSATE SODIUM 8.6-50 MG PO TABS
2.0000 | ORAL_TABLET | Freq: Two times a day (BID) | ORAL | Status: DC
  Administered 2016-11-27 – 2016-11-29 (×5): 2 via ORAL
  Filled 2016-11-27 (×5): qty 2

## 2016-11-27 NOTE — Progress Notes (Signed)
Video interpreter used [phone], discussed patient plan of care and newborn. Patient stated she had pain rated 7/10 after receiving 2 PRN Percocet's, later described pain as gas pains. Dr.Shank notified and encouraged patient to ambulate and he will follow up shortly. Also patient given warm juice and heat packs to alleviate pain. Floor interpreter Johnny BridgeMartha also followed up with patient and reiterated the importance of patient ambulating to help with gas pains. Will continue to monitor patient.

## 2016-11-27 NOTE — Progress Notes (Signed)
POSTPARTUM PROGRESS NOTE  Post Operative Day 2 Subjective:  Paula Gamble is a 21 y.o. [id number] 3433w3d s/p PLTCS for failure to progress.  No acute events overnight.  Pt denies problems with ambulating, voiding or po intake.  She denies nausea or vomiting.  Pain is moderately controlled.  She has not had flatus. She has not had bowel movement.  Lochia Small.  She reports signifant feelings of bloating and gas pains. She has been walking a little. Simethicone and percocet have not helped her pain.   Objective: Blood pressure 120/71, pulse 74, temperature 98.2 F (36.8 C), temperature source Oral, resp. rate 20, height 5\' 4"  (1.626 m), weight 206 lb (93.4 kg), last menstrual period 02/23/2016, SpO2 100 %, unknown if currently breastfeeding.  Physical Exam:  General: alert, cooperative and no distress Lochia:normal flow Chest: no respiratory distress Heart:regular rate, distal pulses intact Incision: c/d/i Abdomen: distended, tympanic decreased BS Uterine Fundus: firm, appropriately tender DVT Evaluation: No calf swelling or tenderness Extremities: trace edema   Recent Labs  11/26/16 0510  HGB 10.2*  HCT 29.5*    Assessment/Plan:  ASSESSMENT: Paula Gamble is a 21 y.o. [id number] 6333w3d s/p pltcs for failure to progress  #1: concern for post-operative ileus. Flat plat of the abdomen ordered. Patient not reporting significant nausea so no need for NPO status at this time.  Plan for discharge tomorrow   LOS: 4 days   Les Pouicholas SchenkMD 11/27/2016, 10:09 AM

## 2016-11-27 NOTE — Progress Notes (Signed)
Pt c/o back pain 10/10,constant and aching. Also stating she feels something like "air" under her dressing, and pressure in her throat when sitting and standing. Bowel sounds assessed, all quadrants audible and hypoactive. Dr. Genevie AnnSchenk notified and will follow up with patient shortly, also ordered patient to have one additional dose of percocet and clear liquid diet. Patient also given a suppository assisted by L.Clovis RileyMitchell and United Parcelranslator Raquel at bedside. Will continue to monitor patient and encourage ambulation.

## 2016-11-27 NOTE — Progress Notes (Signed)
RN called into patient's room by pt's mom. Pt found extremely drowsy and feeling faint sitting on the toilet in the bathroom. VS WML. Fundus firm and no excessive bleeding (small to scant amount on pad and in toilet). Pt unable to keep eyes open and states she cannot talk. Pt able to answer questions however. Pt stated she had no pain, but has only slept for 3 hours since giving birth 2 days ago. Ammonia used to keep pt awake while asking questions as she was drifting off often. After a few minutes, pt was walked back to bed with RN assistance and was educated on the importance of sleep. Pt refused to allow RN to take infant to nursery while mom rested. Infant found cueing in the crib. MOB states she has not been able to feed infant very well due to pain when latching and MOB reports infant does not like formula and will not take a bottle. RN assisted baby with formula feeding, infant taking 28mL with encouragement. Infant placed back in crib and MOB asleep in bed when RN left room. Pt's mother awake on the couch. Video interpreter [phone] used.

## 2016-11-27 NOTE — Progress Notes (Signed)
With assistance of intrepreter Raquel, explained to patient the importance of ambulating and being out of the bed. Explained that until she is passing gas or has a bowel movement she needs to be on clear liquid diet. Explained that she needs to walk in hallway at least 2 times tonight. Patient has not been to bathroom since suppostitory so instructed to go and see if she can pass any stool or flatus. Pt reports only "alittle" pain at present.  Informed patient that the pain medicine (percocet) can slow intestinal movement so it is important that she be out of bed moving if she is taking the pain medicine. Pt's mother at bedside to assist. Clear liquid dinner ordered for patient by intrepreter. Pt verbalized understanding of instructuions

## 2016-11-27 NOTE — Progress Notes (Signed)
Pt again instructed to ambulate at approx 2230- was in bathroom. Afterwards pt ststed needed to rest after being up to stool again. Pt c/o pain at this time- medicated and instructed needs to walk at 0100- will go back with intrepreter at that time.

## 2016-11-28 NOTE — Progress Notes (Signed)
With video  interpreter [phone], discussed with patient the type and location of pain- patient agreed that it feels like it could be gas- states is "crampy, colicky" throughout abd. Once again explained how ambulating can help. Pt asking about another suppository and was made aware that she can have another one- wants to wait till morning. Reports had 2 small bowel movements last evening after the suppository. Pt denies nausea so given warm prune/apple juice mixture. Pt reports is passing flatus. Instructed pt to ask MD about solid food when rounding. Pt and family stated they had no questions.

## 2016-11-28 NOTE — Lactation Note (Addendum)
This note was copied from a baby's chart. Lactation Consultation Note  Patient Name: Boy Darryl LentJulissa Lebron-Ubiera ZOXWR'UToday's Date: 11/28/2016 Reason for consult: Follow-up assessment  With this first time [age] year old mom, and term baby, now 5770 hours old. I had Spanish interpreter, Wallene HuhMarta, in the room to interpret for me. Mom has been breast feeding and then offering formula. I explained how this keeps the baby away from her breast, and decreases her milk supply. I suggested using a DEP, to have EBm to supplement the baby and to protect her milk supply, and  mom agreed to try this. I set up a DEP for mom, and [facility], if possible. Mom was able to hand express some milk after pumping, but found ti too painful. Mom knows to call for questions/conerns.    Maternal Data    Feeding Feeding Type: Breast Fed Length of feed: 10 min  LATCH Score/Interventions                      Lactation Tools Discussed/Used WIC Program: Yes [facility]ette Otto[facility] see if a [facility]P could be available to mom) Pump Review: Setup, frequency, and cleaning;Milk Storage;Other (comment) (pump setting and hand expression) Initiated by:: Jorie Zee Sabra HeckLee, Rn IBCLC Date initiated:: 11/28/16   Consult Status Consult Status: Follow-up Date: 11/29/16 Follow-up type: In-patient    Alfred LevinsLee, Rissa Turley Anne 11/28/2016, 11:31 AM

## 2016-11-28 NOTE — Progress Notes (Signed)
Pt ambulated short distance in hall- c/o pain of 10 while up.  Holding onto arms of family member while walking- RN standby assist. Encouraged pt to nap and try walking again before morning and increasing distance small amt each time. Pt reports is passing flatus.

## 2016-11-28 NOTE — Progress Notes (Signed)
Subjective: Postpartum Day 3: Cesarean Delivery Patient reports incisional pain, tolerating PO, + flatus and no problems voiding.    Objective: Vital signs in last 24 hours: Temp:  [98.6 F (37 C)-98.7 F (37.1 C)] 98.7 F (37.1 C) (07/08 0616) Pulse Rate:  [63-68] 68 (07/08 0616) Resp:  [16-20] 20 (07/08 0616) BP: (117)/(50-58) 117/50 (07/08 [id number])  Physical Exam:  General: alert, cooperative, appears stated age and no distress Lochia: appropriate Uterine Fundus: firm Incision: healing well, no significant drainage, no dehiscence, no significant erythema DVT Evaluation: No evidence of DVT seen on physical exam.   Recent Labs  11/26/16 0510  HGB 10.2*  HCT 29.5*    Assessment/Plan: Status post Cesarean section. Doing well postoperatively.  Continue current care. Will progress clear liq diet to reg and assess pt tolerance of reg diet.  Paula Gamble 11/28/2016, 9:50 AM

## 2016-11-28 NOTE — Progress Notes (Signed)
With assistance from interpreter Raquel, pt instructed on continued need for ambulation and that she needs to try walking farther. Explained to patient that pain is normal to some extent and that usually tolerance increases with increased activity. Discussed normal amount of lochia. Discussed discharge timeline. Discussed how to treat possible gassiness in baby. Pt and mother aware of availabililty of interpreter and to let RN know if one is needed. Dinner ordered for patient by interpreter.

## 2016-11-29 ENCOUNTER — Encounter: Payer: Self-pay | Admitting: Family Medicine

## 2016-11-29 LAB — BASIC METABOLIC PANEL
ANION GAP: 6 (ref 5–15)
BUN: 7 mg/dL (ref 6–20)
CHLORIDE: 107 mmol/L (ref 101–111)
CO2: 24 mmol/L (ref 22–32)
Calcium: 8.4 mg/dL — ABNORMAL LOW (ref 8.9–10.3)
Creatinine, Ser: 0.47 mg/dL (ref 0.44–1.00)
Glucose, Bld: 82 mg/dL (ref 65–99)
POTASSIUM: 3.8 mmol/L (ref 3.5–5.1)
SODIUM: 137 mmol/L (ref 135–145)

## 2016-11-29 LAB — TROPONIN I

## 2016-11-29 LAB — CBC
HCT: 29.6 % — ABNORMAL LOW (ref 36.0–46.0)
HEMOGLOBIN: 9.8 g/dL — AB (ref 12.0–15.0)
MCH: 32.1 pg (ref 26.0–34.0)
MCHC: 33.1 g/dL (ref 30.0–36.0)
MCV: 97 fL (ref 78.0–100.0)
Platelets: 173 10*3/uL (ref 150–400)
RBC: 3.05 MIL/uL — AB (ref 3.87–5.11)
RDW: 14 % (ref 11.5–15.5)
WBC: 8 10*3/uL (ref 4.0–10.5)

## 2016-11-29 LAB — BRAIN NATRIURETIC PEPTIDE: B NATRIURETIC PEPTIDE 5: 44.6 pg/mL (ref 0.0–100.0)

## 2016-11-29 MED ORDER — OXYCODONE-ACETAMINOPHEN 5-325 MG PO TABS: 1.0000 | ORAL_TABLET | Freq: Four times a day (QID) | ORAL | 0 refills | Status: DC | PRN

## 2016-11-29 MED ORDER — DOCUSATE SODIUM 100 MG PO CAPS: 100.0000 mg | ORAL_CAPSULE | Freq: Two times a day (BID) | ORAL | 2 refills | Status: DC | PRN

## 2016-11-29 MED ORDER — IBUPROFEN 600 MG PO TABS: 600.0000 mg | ORAL_TABLET | Freq: Four times a day (QID) | ORAL | 2 refills | Status: DC | PRN

## 2016-11-29 NOTE — Discharge Instructions (Signed)
Parto vaginal, cuidados posteriores  (Vaginal Delivery, Care After)  Siga estas instrucciones durante las próximas semanas. Estas indicaciones le proporcionan información acerca de cómo deberá cuidarse después del parto. El médico también podrá darle instrucciones más específicas. El tratamiento ha sido planificado según las prácticas médicas actuales, pero en algunos casos pueden ocurrir problemas. Llame al médico si tiene problemas o preguntas.  QUÉ ESPERAR DESPUÉS DEL PARTO  Después de un parto vaginal, es frecuente tener lo siguiente:  · Hemorragia leve de la vagina.  · Dolor en el abdomen, la vagina y la zona de la piel entre la abertura vaginal y el ano (perineo).  · Calambres pélvicos.  · Fatiga.  INSTRUCCIONES PARA EL CUIDADO EN EL HOGAR  Medicamentos  · Tome los medicamentos de venta libre y los recetados solamente como se lo haya indicado el médico.  · Si le recetaron un antibiótico, tómelo como se lo haya indicado el médico. No interrumpa la administración del antibiótico hasta que lo haya terminado.  Conducir  · No conduzca ni opere maquinaria pesada mientras toma analgésicos recetados.  · No conduzca durante 24 horas si le administraron un sedante.  Estilo de vida  · No beba alcohol. Esto es de suma importancia si está amamantando o toma analgésicos.  · No consuma productos que contengan tabaco, incluidos cigarrillos, tabaco de mascar o cigarrillos electrónicos. Si necesita ayuda para dejar de fumar, consulte al médico.  Comida y bebida  · Beba al menos 8 vasos de 8 onzas (240 cc) de agua todos los días a menos que el médico le indique lo contrario. Si elige amamantar al bebé, quizá deba beber aún más cantidad de agua.  · Ingiera alimentos ricos en fibras todos los días. Estos alimentos pueden ayudarla a prevenir o aliviar el estreñimiento. Los alimentos ricos en fibras incluyen, entre otros:  ? Panes y cereales integrales.  ? Arroz integral.  ? Frijoles.  ? Frutas y verduras  frescas.  Actividad  · Reanude sus actividades normales como se lo haya indicado el médico. Pregúntele al médico qué actividades son seguras para usted.  · Descanse todo lo que pueda. Trate de descansar o tomar una siesta mientras el bebé está durmiendo.  · No levante objetos que pesen más de 10 libras (4,5 kg) hasta que el médico le diga que es seguro hacerlo.  · Hable con el médico sobre cuándo puede volver a tener relaciones sexuales. Esto puede depender de lo siguiente:  ? Riesgo de sufrir infecciones.  ? Velocidad de cicatrización.  ? Comodidad y deseo de tener relaciones sexuales.  Cuidados vaginales  · Si le realizaron una episiotomía o tuvo un desgarro vaginal, contrólese la zona todos los días para detectar signos de infección. Esté atenta a los siguientes signos:  ? Aumento del enrojecimiento, la hinchazón o el dolor.  ? Más líquido o sangre.  ? Calor.  ? Pus o mal olor.  · No use tampones ni se haga duchas vaginales hasta que el médico la autorice.  · Controle la sangre que elimina por la vagina para detectar coágulos. Pueden tener el aspecto de grumos de color rojo oscuro, marrón o negro.  Instrucciones generales  · Mantenga el perineo limpio y seco, como se lo haya indicado el médico.  · Use ropa cómoda y suelta.  · Cuando vaya al baño, siempre higienícese de adelante hacia atrás.  · Pregúntele al médico si puede ducharse o tomar baños de inmersión. Si se le realizó una episiotomía o tuvo un desgarro perineal durante el trabajo del parto o   el parto, es posible que el médico le indique que no tome baños de inmersión durante un determinado tiempo.  · Use un sostén que sujete y ajuste bien sus pechos.  · Si es posible, pídale a alguien que la ayude con las tareas del hogar y a cuidar del bebé durante al menos algunos días después de salir del hospital.  · Concurra a todas las visitas de seguimiento para usted y el bebé, como se lo haya indicado el médico. Esto es importante.  SOLICITE ATENCIÓN MÉDICA  SI:  · Tiene los siguientes síntomas:  ? Secreción vaginal que tiene mal olor.  ? Dificultad para orinar.  ? Dolor al orinar.  ? Aumento o disminución repentinos de la frecuencia con que defeca.  ? Más enrojecimiento, hinchazón o dolor alrededor de la episiotomía o del desgarro vaginal.  ? Más secreción de líquido o sangre de la episiotomía o desgarro vaginal.  ? Pus o mal olor proveniente de la episiotomía o el desgarro vaginal.  ? Fiebre.  ? Erupción cutánea.  ? Poco interés o falta de interés en actividades que solían gustarle.  ? Dudas sobre su cuidado y el del bebé.  · Siente la episiotomía o el desgarro vaginal caliente al tacto.  · La episiotomía o el desgarro vaginal se está abriendo o no parece cicatrizar.  · Siente dolor en las mamas, o están duras o enrojecidas.  · Siente tristeza o preocupación de forma inusual.  · Siente náuseas o vomita.  · Elimina coágulos grandes por la vagina. Si expulsa un coágulo sanguíneo por la vagina, guárdelo para mostrárselo a su médico. No tire la cadena sin que el médico examine el coágulo antes.  · Orina más de lo habitual.  · Se siente mareada o se desmaya.  · No ha amamantado para nada y no ha tenido un período menstrual durante 12 semanas después del parto.  · Dejó de amamantar al bebé y no ha tenido su período menstrual durante 12 semanas después de dejar de amamantar.    SOLICITE ATENCIÓN MÉDICA DE INMEDIATO SI:  · Tiene los siguientes síntomas:  ? Dolor que no desaparece o no mejora con el medicamento.  ? Dolor en el pecho.  ? Dificultad para respirar.  ? Visión borrosa o manchas en la vista.  ? Pensamientos de autolesionarse o lesionar al bebé.  · Comienza a sentir dolor en el abdomen o en una de las piernas.  · Dolor de cabeza intenso.  · Se desmaya.  · Tiene una hemorragia tan intensa de la vagina que empapa dos toallitas sanitarias en una hora.    Esta información no tiene como fin reemplazar el consejo del médico. Asegúrese de hacerle al médico cualquier  pregunta que tenga.  Document Released: 05/10/2005 Document Revised: 09/01/2015 Document Reviewed: 05/25/2015  Elsevier Interactive Patient Education © 2017 [employer]vier Inc.

## 2016-11-29 NOTE — Progress Notes (Signed)
Despite being instructed on importance of ambulating numerous times over past 2 days, pt did not walk in hall except once to walk to ice machine (approx 20 feet from room). Has been up to bathroom but otherwise has been noted to be in bed since 1930.

## 2016-11-29 NOTE — Progress Notes (Signed)
Reassessed pt at 0345- sitting up in bed breastfeeding baby. Denies further chest pain but still c/o abd pain. Resp unlabored and regular with O2 sat of 99%. Reports will walk when done feeding baby. Pt calm and responding appropriately.

## 2016-11-29 NOTE — Lactation Note (Signed)
This note was copied from a baby's chart. Lactation Consultation Note  Patient Name: Paula Gamble Date: 11/29/2016 ( during consult - had 3 different Spanish interpreters see doc flow sheets, due poor connection )  Reason for consult: Follow-up assessment;Infant weight loss  Baby is 32 days old,  Moms feeding preference - Breast / formula  Supplementing 60 ml several times .  Baby at the breast at the start of the consult, latched with depth and feeding  And then got fussy and released. Mik isn't in yet , LC recommended pre -pump  Prior to every feeding to prime the milk ducts and then feed, supplement afterwards.  If the baby to fussy to latch, appetizer, then latch.  Per mom nipples are less sore with latch, and engorgement prevention and tx.  Mom has a hand pump and a DEBP kit for D/C.  LC recommended and encouraged baby get practice at the breast.     Maternal Data    Feeding Feeding Type: Breast Fed Length of feed: 5 min (on and off pattern )  LATCH Score/Interventions Latch: Repeated attempts needed to sustain latch, nipple held in mouth throughout feeding, stimulation needed to elicit sucking reflex. Intervention(s): Adjust position;Assist with latch;Breast massage;Breast compression  Audible Swallowing: None  Type of Nipple: Everted at rest and after stimulation  Comfort (Breast/Nipple): Soft / non-tender     Hold (Positioning): Assistance needed to correctly position infant at breast and maintain latch. Intervention(s): Breastfeeding basics reviewed;Support Pillows;Position options;Skin to skin  LATCH Score: 6  Lactation Tools Discussed/Used Tools: Pump Breast pump type: Manual   Consult Status Consult Status: Complete Date: 11/29/16 Follow-up type: In-patient    [place] 11/29/2016, 12:27 PM

## 2016-11-29 NOTE — Progress Notes (Signed)
See progress notes

## 2016-11-29 NOTE — Progress Notes (Signed)
CSW received consult for Edinburgh Postpartum Depression score of 17.  CSW met with MOB in her first floor room/147 to offer support and education.  CSW utilized a Administrator, sports through [employer] to speak with MOB.  FOB and a female visitor was also present and MOB gave permission to speak openly with them in the room. MOB presented as pleasant, with minimal interest in speaking with CSW.  She states she feels "good" at this time and stated no concerns or questions about her emotional state.  She was attentive while CSW provided education regarding signs and symptoms of PMADs.  She states no hx of mental illness and agrees to call her doctor if she has concerns about her emotional health at any time.  MOB was receptive to information given by CSW on outpatient mental health treatment if needed at any time.  CSW identifies no barriers to discharge.

## 2016-11-29 NOTE — Progress Notes (Signed)
Faculty Practice OB/GYN Attending Note   Subjective:  Called to evaluate patient with chest pain and SOB that occurred all of a sudden. On evaluation, patient is able to talk to me with the help a Spanish video interpreter.  She reports having sharp chest pain and associated SOB that was very painful prior to my arrival. No fevers, palpitations, radiation of pain or other symptoms.  Of note, patient is now POD#4 s/p LTCS, postoperative course complicated by ileus.      Objective:  Blood pressure (!) 122/51, pulse 86, temperature 97.7 F (36.5 C), temperature source Oral, resp. rate 14, height 5\' 4"  (1.626 m), weight 206 lb (93.4 kg), last menstrual period 02/23/2016, SpO2 100 %, unknown if currently breastfeeding. Gen: NAD HENT: Normocephalic, atraumatic Lungs: Normal respiratory effort Heart: Regular rate noted Abdomen: NT, firm fundus, soft Ext: 2+ DTRs, no edema, no cyanosis, negative Homan's sign  Assessment & Plan:  21 y.o. [id number] POD#4 s/p LTCS now with chest pain and SOB. Pain is now better, stable vitals.  100% oxygen saturation and normal HR are reassuring; but will obtain EKG and labs. If pain continues or other signs/symptoms worsen or get more concerning, will obtain CT scan to rule VTE.  Will continue close observation.  Of note, patient has history of panic attacks. If all the labs and signs are not concerning, will treat her for this.    Jaynie CollinsUGONNA  Leonte Horrigan, MD, FACOG Attending Obstetrician & Gynecologist Faculty Practice, [facility] [place]

## 2016-11-29 NOTE — Progress Notes (Signed)
At approx 0150 patient's mother called out on call light for assistance. When RN entered room within 1 minute, pt crying and screamed out. Would not answer questions about what was wrong- was shaking and hyperventilating. Language barrier but pt's mother was able to convey that pt was having right sided chest pain of sudden onset- described as tightness with it being worse with inspiration. Color pink. MD notified and V/S obtained with O2 sat 100% (see V/S flowsheet). Dr  Macon LargeAnyanwu in to evaluate pt and orders received. Video interpreter [phone] assisted staff with evaluation- very difficult at first to get pt to answer questions but did calm down and start answering question. Explained to pt and mother that an EKG and labwork would be obtained. Lab in at 0220 and EKG here at 0225. O2 sats consistently 98-100% on room air, V/S remaining stable. Pt assisted to bathroom at 0240 and tolerated well. When back to bed pt agrees that chest pain is better but still ranks as an 8 while abd pain is 10.  C/O  Frontal headache. Pt acting calmer and is breathing without difficulty- 16/min. Bil breath sounds clear. Will continue to watch. Patient had already received 2 percocet at 0132.

## 2016-11-29 NOTE — Plan of Care (Signed)
Problem: Pain Management: Goal: General experience of comfort will improve and pain level will decrease Outcome: Completed/Met Date Met: 11/29/16 Discharge education reviewed with patient and family with in patient interpreter's (Eda Royal) assistance . Patient and family verbalize understanding of information. [name], [name]

## 2016-11-29 NOTE — Discharge Summary (Signed)
OB Discharge Summary     Patient Name: Paula Gamble DOB: 1996/02/02 MRN: [id number]  Date of admission: 11/23/2016 Delivering MD: Duane Lope H   Date of discharge: 11/29/2016  Admitting diagnosis: 39wks ctx and bleeding Intrauterine pregnancy: [redacted]w[redacted]d     Secondary diagnosis:  Active Problems:   Leakage of amniotic fluid  Additional problems: None     Discharge diagnosis: Term Pregnancy Delivered                                                                                                Post partum procedures:None  Augmentation: Pitocin  Complications: None  Hospital course:  Onset of Labor With Unplanned C/S  21 y.o. yo [id number] at [redacted]w[redacted]d was admitted in Latent Labor on 11/23/2016. Patient had a labor course significant for arrest of dilation and descent in first stage of labor, suspected macrosomia. Membrane Rupture Time/Date: 4:30 PM ,11/23/2016   The patient went for cesarean section due to Arrest of Dilation and Arrest of Descent, and delivered a Viable infant,11/25/2016  Details of operation can be found in separate operative note. Patient had a postpartum course complicated by mild ileus which resolved. Also has an episode of chest pain with negative evaluation.  She is ambulating, tolerating a regular diet, passing flatus, and urinating well.  Patient is discharged home in stable condition 11/29/16.  Physical exam  Vitals:   11/29/16 0235 11/29/16 0300 11/29/16 0345 11/29/16 0445  BP: 112/63 123/65  111/61  Pulse: 79 73  69  Resp: 20 16  19   Temp:    98.5 F (36.9 C)  TempSrc:    Oral  SpO2: 98% 99% 99% 99%  Weight:      Height:       General: alert, cooperative and no distress Lochia: appropriate Uterine Fundus: firm Incision: Healing well with no significant drainage, No significant erythema, Dressing is clean, dry, and intact DVT Evaluation: No evidence of DVT seen on physical exam. Negative Homan's sign. No cords or calf tenderness. Labs: Lab Results   Component Value Date   WBC 8.0 11/29/2016   HGB 9.8 (L) 11/29/2016   HCT 29.6 (L) 11/29/2016   MCV 97.0 11/29/2016   PLT 173 11/29/2016   CMP Latest Ref Rng & Units 11/29/2016  Glucose 65 - 99 mg/dL 82  BUN 6 - 20 mg/dL 7  Creatinine 1.47 - 8.29 mg/dL 5.62  Sodium 130 - 865 mmol/L 137  Potassium 3.5 - 5.1 mmol/L 3.8  Chloride 101 - 111 mmol/L 107  CO2 22 - 32 mmol/L 24  Calcium 8.9 - 10.3 mg/dL 7.8(I)  Total Protein 6.5 - 8.1 g/dL -  Total Bilirubin 0.3 - 1.2 mg/dL -  Alkaline Phos 38 - 696 U/L -  AST 15 - 41 U/L -  ALT 14 - 54 U/L -    Discharge instruction: per After Visit Summary and "Baby and Me Booklet".  After visit meds:  Allergies as of 11/29/2016      Reactions   Latex Itching      Medication List    TAKE these medications   docusate  sodium 100 MG capsule Commonly known as:  COLACE Take 1 capsule (100 mg total) by mouth 2 (two) times daily as needed.   ibuprofen 600 MG tablet Commonly known as:  ADVIL,MOTRIN Take 1 tablet (600 mg total) by mouth every 6 (six) hours as needed for moderate pain or cramping.   oxyCODONE-acetaminophen 5-325 MG tablet Commonly known as:  PERCOCET/ROXICET Take 1-2 tablets by mouth every 6 (six) hours as needed for severe pain (pain scale 4-7).   polyethylene glycol packet Commonly known as:  MIRALAX / GLYCOLAX Take 17 g by mouth daily as needed for mild constipation.   prenatal multivitamin Tabs tablet Take 1 tablet by mouth daily at 12 noon.       Diet: routine diet  Activity: Advance as tolerated. Pelvic rest for 6 weeks.   Outpatient follow up:6 weeks Follow up Appt:Future Appointments Date Time Provider Department Center  01/06/2017 2:40 PM Marny LowensteinWenzel, Julie N, PA-C [facility]   Postpartum contraception: Nexplanon  Newborn Data: Live born female  Birth Weight: 8 lb 9.6 oz (3901 g) APGAR: 8, 9  Baby Feeding: Bottle and Breast Disposition:home with mother   11/29/2016 Jaynie CollinsUgonna Katerina Zurn, MD

## 2016-11-29 NOTE — Progress Notes (Signed)
Patient did not want to order lunch. Eda H Royal  Interpreter. °

## 2016-11-29 NOTE — Progress Notes (Signed)
Notified social work and Steffanie RainwaterNick Schenk, MD of Inocente SallesEdinburgh postnatal depression score of 17. To allow social work to see patient prior to discharge. Earl Galasborne, Linda HedgesStefanie Davis City[place]

## 2016-12-01 IMAGING — US US OB TRANSVAGINAL
1 series · 15 of 28 positions shown · non-contrast
Comparison: None.

CLINICAL DATA: Pain in pregnancy.

EXAM:
TRANSVAGINAL OB ULTRASOUND
TECHNIQUE: Transvaginal ultrasound was performed for complete evaluation of the
gestation as well as the maternal uterus, adnexal regions, and
pelvic cul-de-sac.

[Series 1: us ob transvaginal · 15 of 47 slices shown]
[im 1/47]
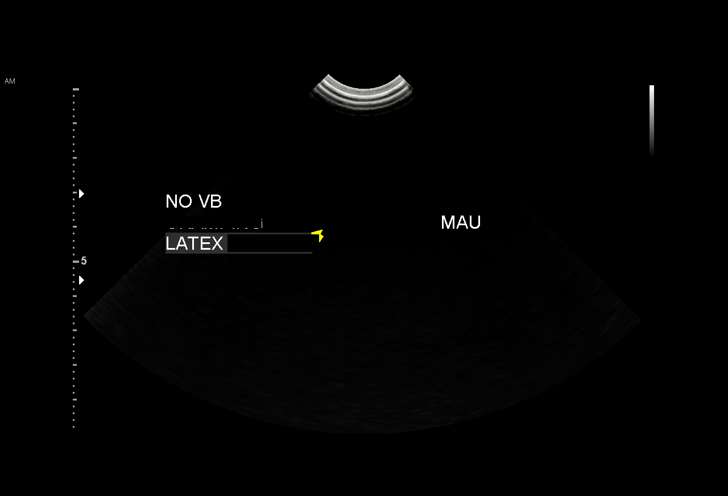
[im 4/47]
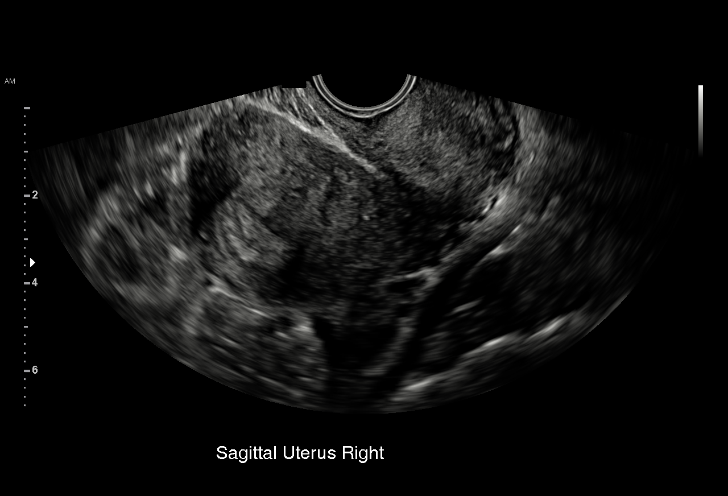
[im 7/47]
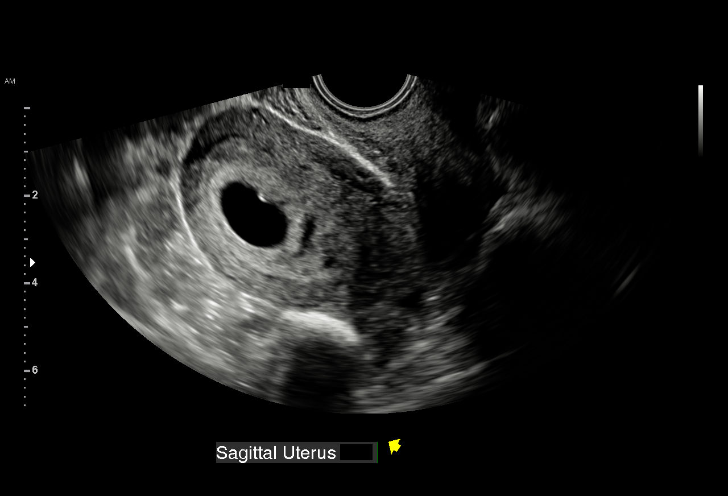
[im 11/47]
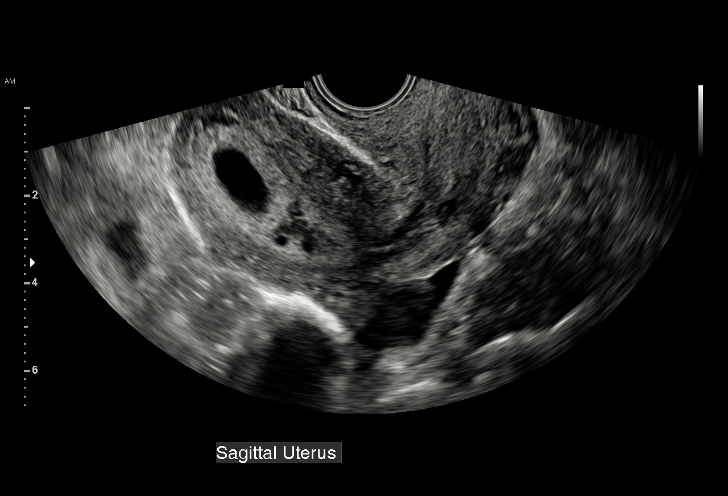
[im 14/47]
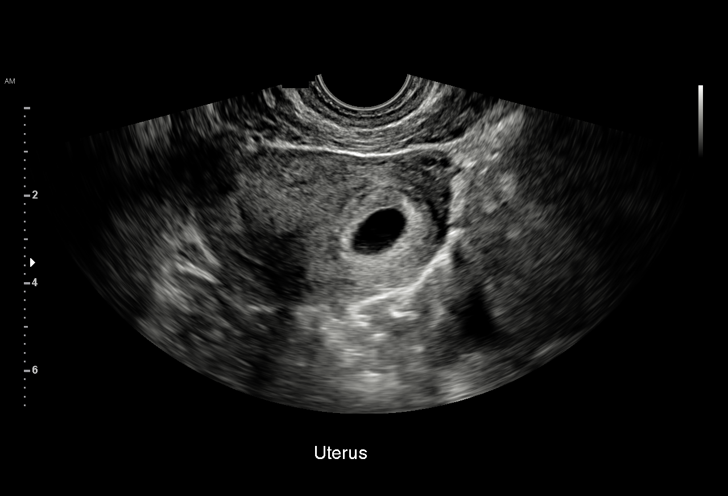
[im 18/47]
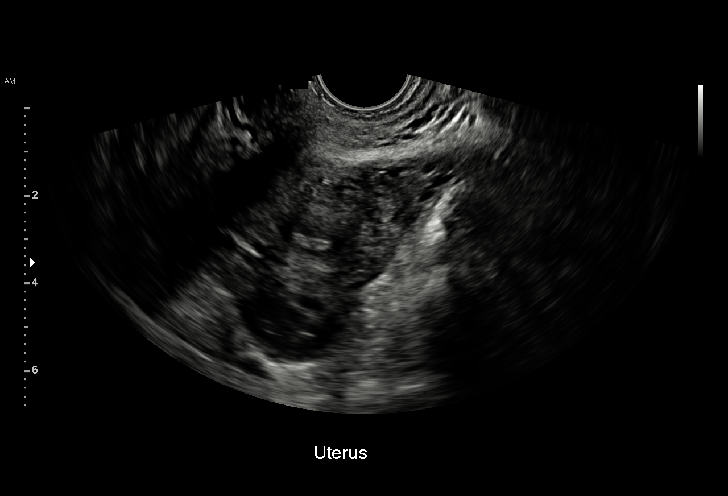
[im 21/47]
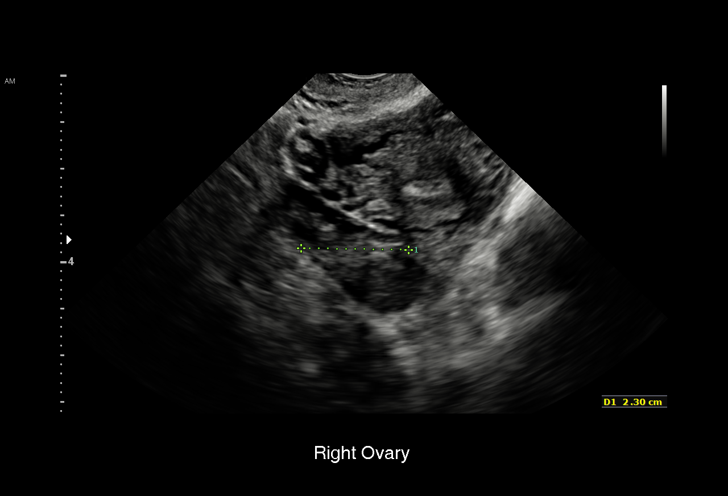
[im 24/47]
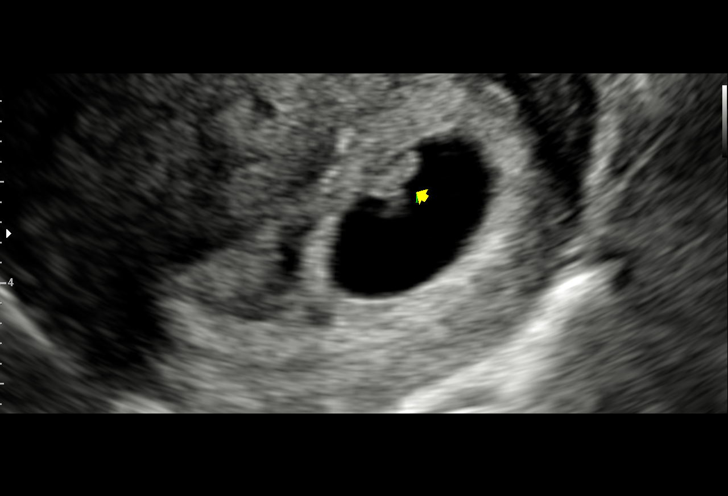
[im 26/47]
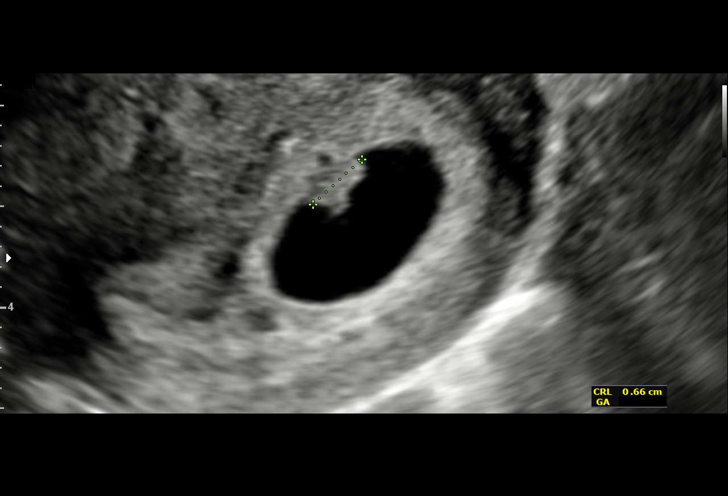
[im 29/47]
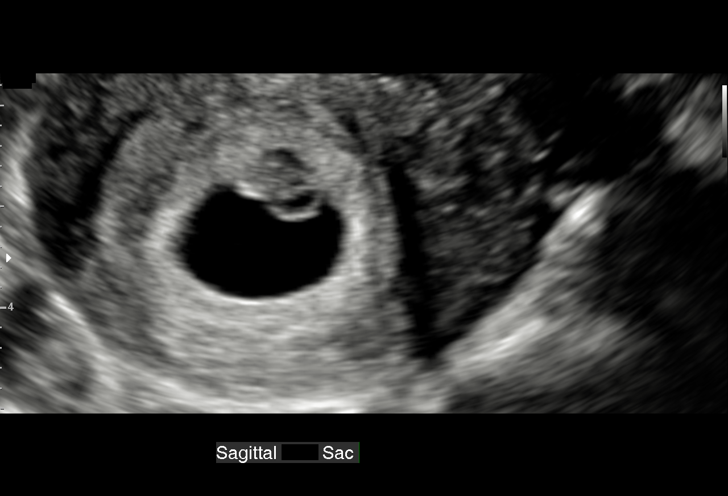
[im 33/47]
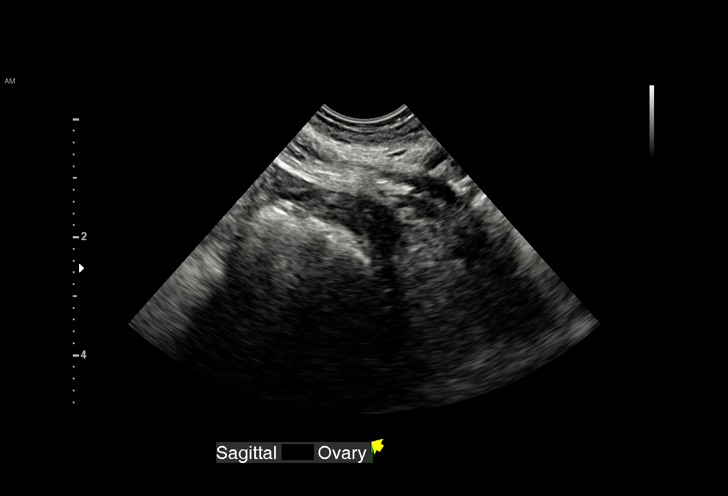
[im 36/47]
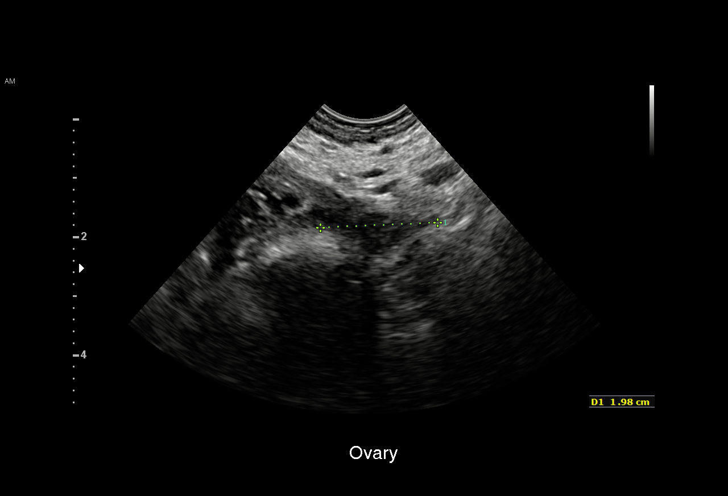
[im 40/47]
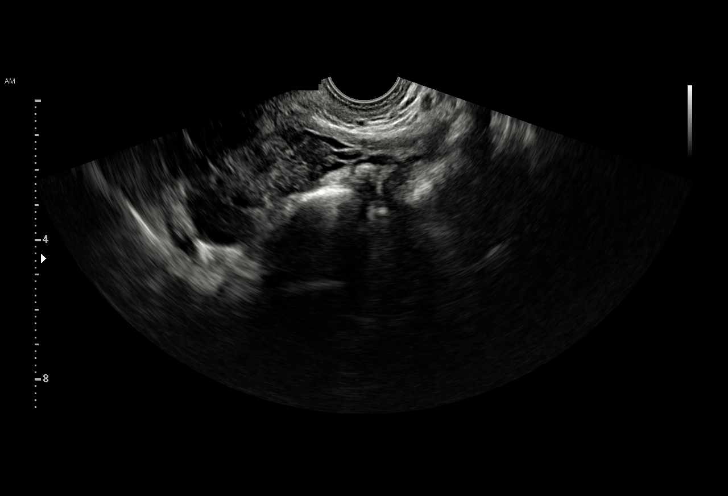
[im 43/47]
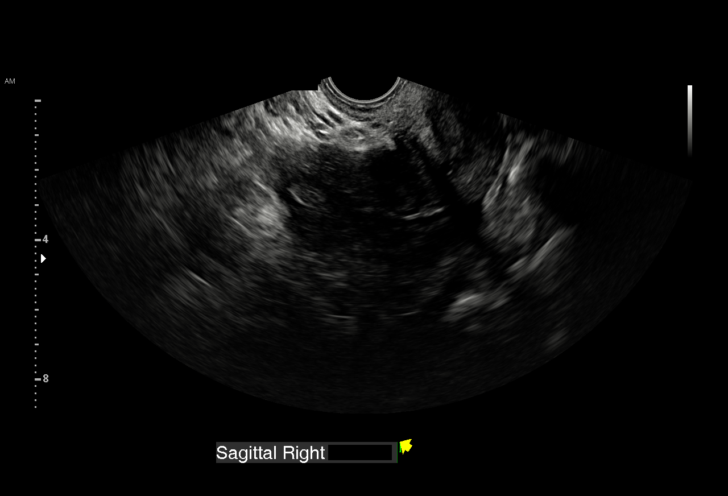
[im 47/47]
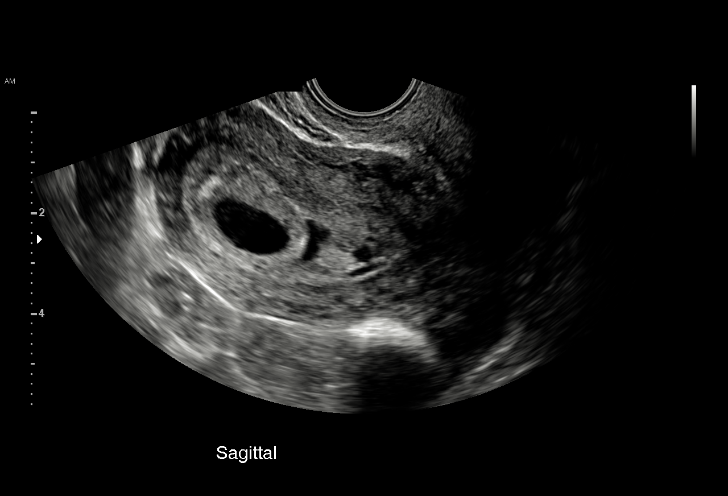

[15 of 28 positions shown; findings below may reference images not displayed]

FINDINGS: Intrauterine gestational sac: Single

Yolk sac:  Visualized.

Embryo:  Visualized.

Cardiac Activity: Visualized.

Heart Rate: 112 bpm

CRL:   7   mm   6 w 3 d                  US EDC: 11/29/2016

Subchorionic hemorrhage:  None visualized.

Maternal uterus/adnexae:

Right ovary: Normal

Left ovary: Not visualized

Other :Patient had difficulty tolerating exam

Free fluid:  None
IMPRESSION: 1. Single living intrauterine gestation. The estimated gestational
age is 6 weeks and 3 days.
2. No gestational age ultrasound is discordant with the clinical
gestational age of 8 weeks and 1 day.

## 2017-01-06 ENCOUNTER — Encounter: Payer: Self-pay | Admitting: Medical

## 2017-01-06 ENCOUNTER — Ambulatory Visit (INDEPENDENT_AMBULATORY_CARE_PROVIDER_SITE_OTHER): Payer: Medicaid Other | Admitting: Medical

## 2017-01-06 DIAGNOSIS — R87612 Low grade squamous intraepithelial lesion on cytologic smear of cervix (LGSIL): Secondary | ICD-10-CM

## 2017-01-06 DIAGNOSIS — R87619 Unspecified abnormal cytological findings in specimens from cervix uteri: Secondary | ICD-10-CM

## 2017-01-06 HISTORY — DX: Unspecified abnormal cytological findings in specimens from cervix uteri: R87.619

## 2017-01-06 NOTE — Progress Notes (Signed)
Subjective:   Spanish Interpreter Paula Gamble   Paula LentJulissa Gamble is a 21 y.o. female who presents for a postpartum visit. She is 5 weeks postpartum following a low cervical transverse Cesarean section. I have fully reviewed the prenatal and intrapartum course. The delivery was at 39.3 gestational weeks. Outcome: primary cesarean section, low transverse incision. Anesthesia: epidural. Postpartum course has been normal. Baby's course has been normal. Baby is feeding by both breast and bottle - Similac Advance. Bleeding thin lochia. Bowel function is normal. Bladder function is normal. Patient is sexually active. Contraception method is none. Postpartum depression screening: negative.  The following portions of the patient's history were reviewed and updated as appropriate: allergies, current medications, past family history, past medical history, past social history, past surgical history and problem list.  Review of Systems Pertinent items are noted in HPI.   Objective:    BP (!) 123/50   Pulse 79   Wt 190 lb 14.4 oz (86.6 kg)   Breastfeeding? Yes   BMI 32.77 kg/m   General:  alert and cooperative   Breasts:  not performed  Lungs: clear to auscultation bilaterally  Heart:  regular rate and rhythm, S1, S2 normal, no murmur, click, rub or gallop  Abdomen: soft, non-tender; bowel sounds normal; no masses,  no organomegaly incision is well-healed   Vulva:  not evaluated  Vagina: not evaluated  Cervix:  not evaluated  Corpus: not examined  Adnexa:  not evaluated  Rectal Exam: Not performed.        Assessment:     Normal postpartum exam. Pap smear not done at today's visit.  Had LSIL pap smear during pregnancy, needs repeat 04/2017.   Plan:    1. Contraception: abstinence and condoms, until Nexplanon can be placed 2. Follow up in: 1 week for Nexplanon placement if UPT is negative or sooner as needed.    Paula Gamble, Paula N, PA-C 01/06/2017 3:37 PM

## 2017-01-06 NOTE — Patient Instructions (Signed)
Etonogestrel implant Qu es este medicamento? El ETONOGESTREL es un dispositivo anticonceptivo (control de la natalidad). Se usa para evitar los embarazos. Se puede usar hasta por 3 aos. Este medicamento puede ser utilizado para otros usos; si tiene alguna pregunta consulte con su proveedor de atencin mdica o con su farmacutico. MARCAS COMUNES: Implanon, Nexplanon Qu le debo informar a mi profesional de la salud antes de tomar este medicamento? Necesita saber si usted presenta alguno de los siguientes problemas o situaciones: sangrado vaginal anormal enfermedad vascular o cogulos sanguneos cncer de mama, cervical, heptico depresin diabetes enfermedad de la vescula biliar dolores de cabeza enfermedad cardiaca o ataque cardiaco reciente alta presin sangunea alto nivel de colesterol enfermedad renal enfermedad heptica convulsiones fuma tabaco una reaccin alrgica o inusual al etonogestrel, otras hormonas, anestsicos o antispticos, medicamentos, alimentos, colorantes o conservantes si est embarazada o buscando quedar embarazada si est amamantando a un beb Cmo debo utilizar este medicamento? Un profesional de la salud inserta este dispositivo justo debajo de la piel en la parte interior del brazo. Hable con su pediatra para informarse acerca del uso de este medicamento en nios. Puede requerir atencin especial. Sobredosis: Pngase en contacto inmediatamente con un centro toxicolgico o una sala de urgencia si usted cree que haya tomado demasiado medicamento. ATENCIN: Este medicamento es solo para usted. No comparta este medicamento con nadie. Qu sucede si me olvido de una dosis? No se aplica en este caso. Qu puede interactuar con este medicamento? No tome este medicamento con ninguno de los siguientes frmacos: amprenavir bosentano fosamprenavir Este medicamento tambin podra interactuar con los siguientes medicamentos: barbitricos para inducir el sueo o para el  tratamiento de convulsiones ciertos medicamentos para infecciones micticas, tales como itraconazol y ketoconazol jugo de toronja griseofulvina medicamentos para tratar convulsiones, tales como carbamazepina, felbamato, oxcarbazepina, fenitona, topiramato modafinilo fenilbutazona rifampicina rufinamida algunos medicamentos para tratar la infeccin por el VIH, tales como atazanavir, indinavir, lopinavir, nelfinavir, tipranavir, ritonavir hierba de [place] Puede ser que esta lista no menciona todas las posibles interacciones. Informe a su profesional de la salud de todos los productos a base de hierbas, medicamentos de venta libre o suplementos nutritivos que est tomando. Si usted fuma, consume bebidas alcohlicas o si utiliza drogas ilegales, indqueselo tambin a su profesional de la salud. Algunas sustancias pueden interactuar con su medicamento. A qu debo estar atento al usar este medicamento? Este producto no protege contra la infeccin por el VIH (SIDA) u otras enfermedades de transmisin sexual. Usted debe sentir el implante al presionar con las yemas de los dedos sobre la piel donde se insert. Contacte a su mdico si no se siente el implante y usa un mtodo anticonceptivo no hormonal (como el condn) hasta que el mdico confirma que el implante est en su lugar. Si siente que el implante puede haber roto o doblado en su brazo, pngase en contacto con su proveedor de atencin mdica. Qu efectos secundarios puedo tener al utilizar este medicamento? Efectos secundarios que debe informar a su mdico o a su profesional de la salud tan pronto como sea posible: reacciones alrgicas, como erupcin cutnea, picazn o urticarias, e hinchazn de la cara, los labios o la lengua bultos en las mamas cambios en las emociones o el estado de nimo estado de nimo deprimido sangrado menstrual intenso o prolongado dolor, irritacin, hinchazn o moretones en el lugar de la insercin cicatriz en el lugar de la  insercin signos de infeccin en el sitio de insercin tales como fiebre, y enrojecimiento de   la piel, dolor o secrecin signos de embarazo signos y sntomas de un cogulo sanguneo, tales como problemas respiratorios; cambios en la visin; dolor en el pecho; dolor de cabeza severo, repentino; dolor, hinchazn, calor en la pierna; dificultad para hablar; entumecimiento o debilidad repentina de la cara, el brazo o la pierna signos y sntomas de lesin al hgado, como orina amarilla oscura o marrn; sensacin general de estar enfermo o sntomas gripales; heces claras; prdida de apetito; nuseas; dolor en la regin abdominal superior derecha; cansancio o debilidad inusual; color amarillento de los ojos o la piel sangrado vaginal inusual, secrecin signos y sntomas de un derrame cerebral, tales como cambios en la visin; confusin; dificultad para hablar o entender; dolores de cabeza severos; entumecimiento o debilidad repentina de la cara, el brazo o la pierna; problemas al caminar; mareo; prdida del equilibrio o la coordinacin Efectos secundarios que generalmente no requieren atencin mdica (infrmelos a su mdico o a su profesional de la salud si persisten o si son molestos): acn dolor de espalda dolor en las mamas cambios de peso mareos sensacin general de estar enfermo o sntomas gripales dolor de cabeza sangrado menstrual irregular nuseas dolor de garganta irritacin o inflamacin vaginal Puede ser que esta lista no menciona todos los posibles efectos secundarios. Comunquese a su mdico por asesoramiento mdico sobre los efectos secundarios. Usted puede informar los efectos secundarios a la FDA por telfono al 1-800-FDA-1088. Dnde debo guardar mi medicina? Este medicamento se administra en hospitales o clnicas y no necesitar guardarlo en su domicilio. ATENCIN: Este folleto es un resumen. Puede ser que no cubra toda la posible informacin. Si usted tiene preguntas acerca de esta medicina,  consulte con su mdico, su farmacutico o su profesional de la salud.  2018 Elsevier/Gold Standard (2016-06-10 00:00:00)  

## 2017-01-07 LAB — POCT PREGNANCY, URINE: Preg Test, Ur: NEGATIVE

## 2017-01-25 ENCOUNTER — Ambulatory Visit: Payer: Self-pay | Admitting: Medical

## 2017-02-13 ENCOUNTER — Emergency Department (HOSPITAL_COMMUNITY)
Admission: EM | Admit: 2017-02-13 | Discharge: 2017-02-13 | Disposition: A | Discharge status: Home / Self Care | Payer: Medicaid Other | Attending: Emergency Medicine | Admitting: Emergency Medicine

## 2017-02-13 ENCOUNTER — Encounter (HOSPITAL_COMMUNITY): Payer: Self-pay | Admitting: Emergency Medicine

## 2017-02-13 DIAGNOSIS — R52 Pain, unspecified: Secondary | ICD-10-CM | POA: Insufficient documentation

## 2017-02-13 LAB — CBC WITH DIFFERENTIAL/PLATELET
Basophils Absolute: 0.1 10*3/uL (ref 0.0–0.1)
Basophils Relative: 1 %
EOS PCT: 4 %
Eosinophils Absolute: 0.2 10*3/uL (ref 0.0–0.7)
HCT: 36.9 % (ref 36.0–46.0)
Hemoglobin: 12.2 g/dL (ref 12.0–15.0)
LYMPHS ABS: 1.6 10*3/uL (ref 0.7–4.0)
Lymphocytes Relative: 31 %
MCH: 30.3 pg (ref 26.0–34.0)
MCHC: 33.1 g/dL (ref 30.0–36.0)
MCV: 91.8 fL (ref 78.0–100.0)
MONO ABS: 0.4 10*3/uL (ref 0.1–1.0)
MONOS PCT: 8 %
NEUTROS ABS: 2.9 10*3/uL (ref 1.7–7.7)
Neutrophils Relative %: 56 %
PLATELETS: 124 10*3/uL — AB (ref 150–400)
RBC: 4.02 MIL/uL (ref 3.87–5.11)
RDW: 12.9 % (ref 11.5–15.5)
WBC: 5.2 10*3/uL (ref 4.0–10.5)

## 2017-02-13 LAB — URINALYSIS, ROUTINE W REFLEX MICROSCOPIC
BILIRUBIN URINE: NEGATIVE
Bacteria, UA: NONE SEEN
Glucose, UA: NEGATIVE mg/dL
Ketones, ur: NEGATIVE mg/dL
Leukocytes, UA: NEGATIVE
Nitrite: NEGATIVE
PH: 6 (ref 5.0–8.0)
Protein, ur: NEGATIVE mg/dL
SPECIFIC GRAVITY, URINE: 1.011 (ref 1.005–1.030)

## 2017-02-13 LAB — BASIC METABOLIC PANEL
Anion gap: 4 — ABNORMAL LOW (ref 5–15)
BUN: 9 mg/dL (ref 6–20)
CALCIUM: 8.1 mg/dL — AB (ref 8.9–10.3)
CO2: 25 mmol/L (ref 22–32)
CREATININE: 0.82 mg/dL (ref 0.44–1.00)
Chloride: 109 mmol/L (ref 101–111)
GFR calc Af Amer: >60 mL/min (ref >60)
GFR calc non Af Amer: >60 mL/min (ref >60)
GLUCOSE: 101 mg/dL — AB (ref 65–99)
Potassium: 3.4 mmol/L — ABNORMAL LOW (ref 3.5–5.1)
Sodium: 138 mmol/L (ref 135–145)

## 2017-02-13 LAB — POC URINE PREG, ED: PREG TEST UR: NEGATIVE

## 2017-02-13 LAB — I-STAT CG4 LACTIC ACID, ED: Lactic Acid, Venous: 0.65 mmol/L (ref 0.5–1.9)

## 2017-02-13 MED ORDER — ACETAMINOPHEN 325 MG PO TABS
650.0000 mg | ORAL_TABLET | Freq: Once | ORAL | Status: AC | PRN
  Administered 2017-02-13: 650 mg via ORAL

## 2017-02-13 MED ORDER — ACETAMINOPHEN 325 MG PO TABS
ORAL_TABLET | ORAL | Status: AC
  Filled 2017-02-13: qty 2

## 2017-02-13 NOTE — ED Triage Notes (Signed)
Pt c/o 10/10 generalized body ache and fever for the past 2 days. Temp on triage 102 orally.

## 2017-02-13 NOTE — ED Notes (Signed)
Nurse first informed by tech first that pt has left, despite attempts to get her to stay to be seen. LWBS after triage.

## 2017-02-15 LAB — PATHOLOGIST SMEAR REVIEW: Path Review: REACTIVE

## 2017-02-18 LAB — CULTURE, BLOOD (ROUTINE X 2)
CULTURE: NO GROWTH
CULTURE: NO GROWTH
SPECIAL REQUESTS: ADEQUATE
Special Requests: ADEQUATE

## 2017-02-28 IMAGING — US US MFM OB COMP +14 WKS
1 series · 14 of 28 positions shown · non-contrast
Comparison: none

[Series 1: us mfm ob comp +14 wks · 14 of 86 slices shown]
[im 4/86]
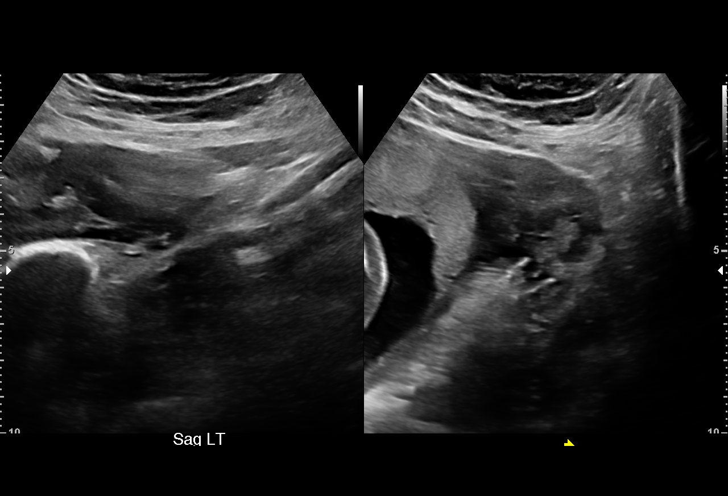
[im 10/86]
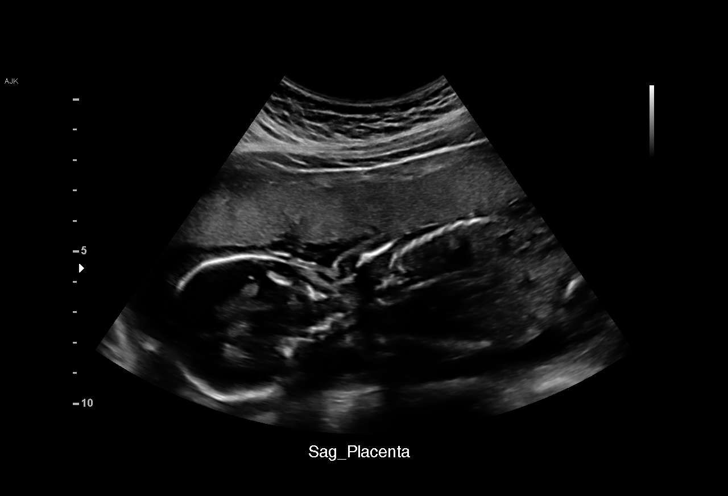
[im 16/86]
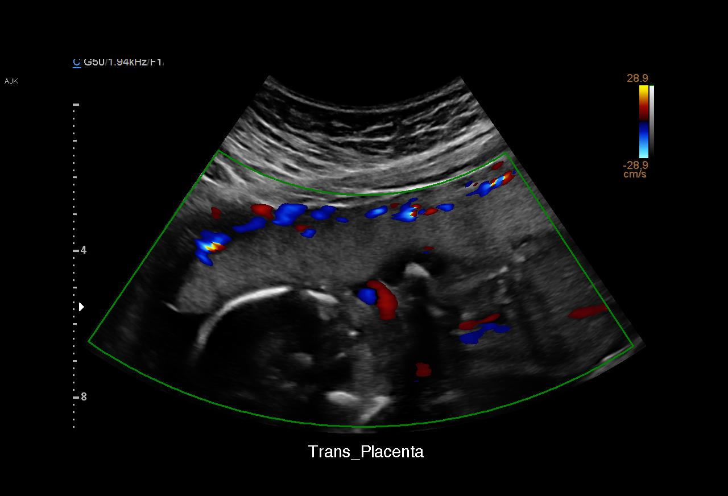
[im 23/86]
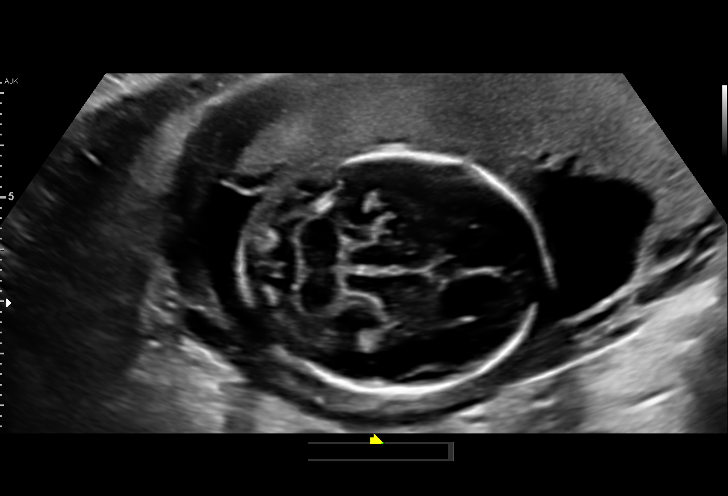
[im 29/86]
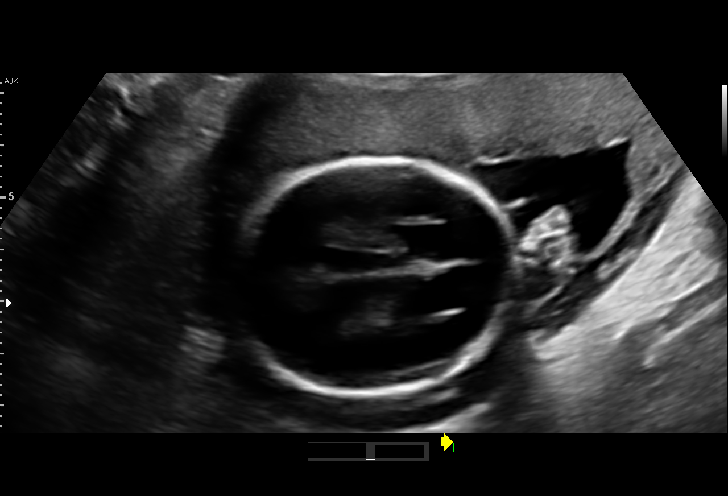
[im 35/86]
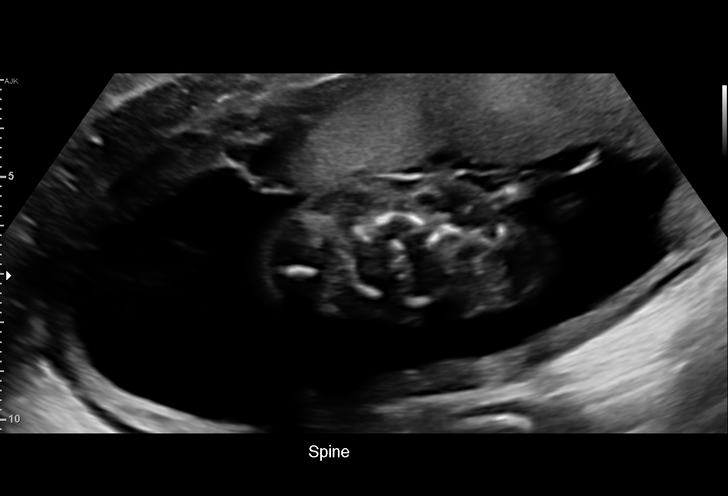
[im 41/86]
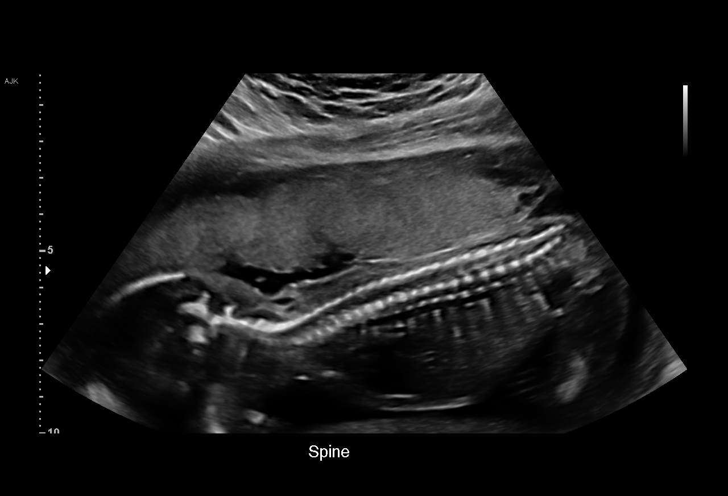
[im 48/86]
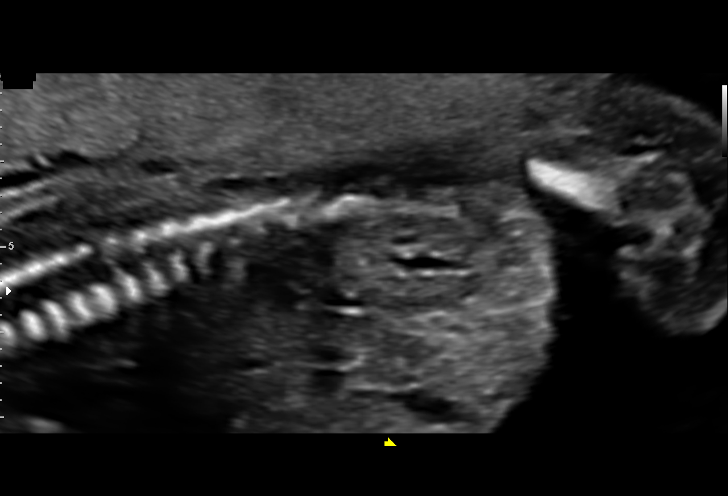
[im 54/86]
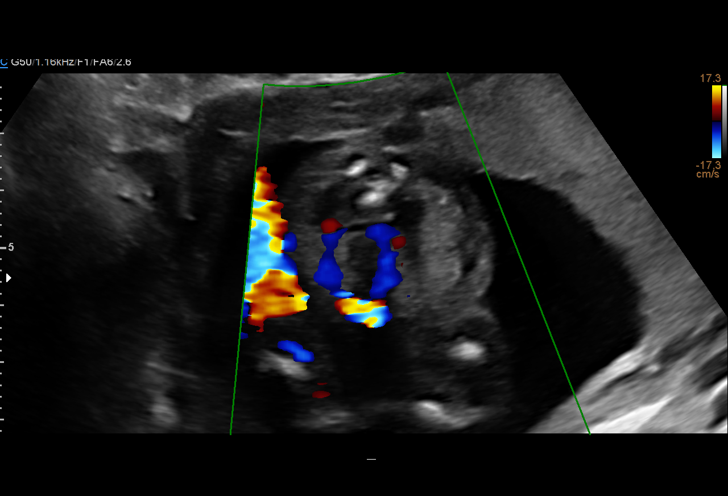
[im 60/86]
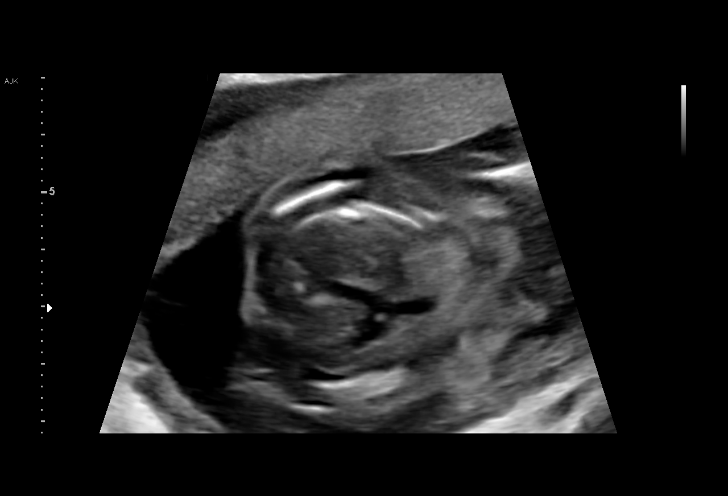
[im 67/86]
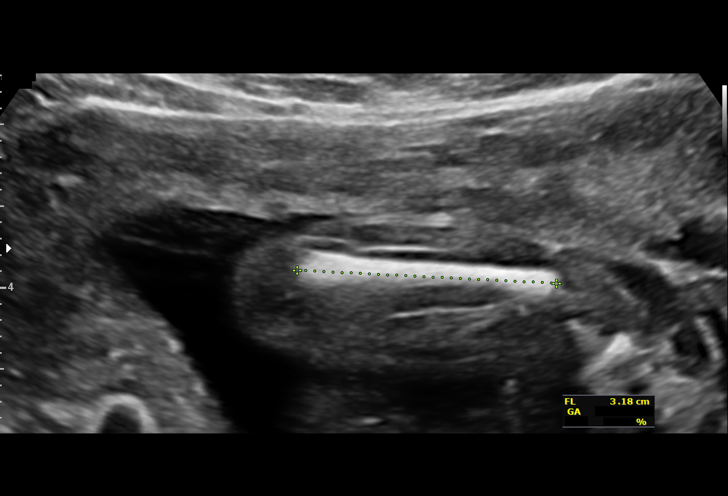
[im 73/86]
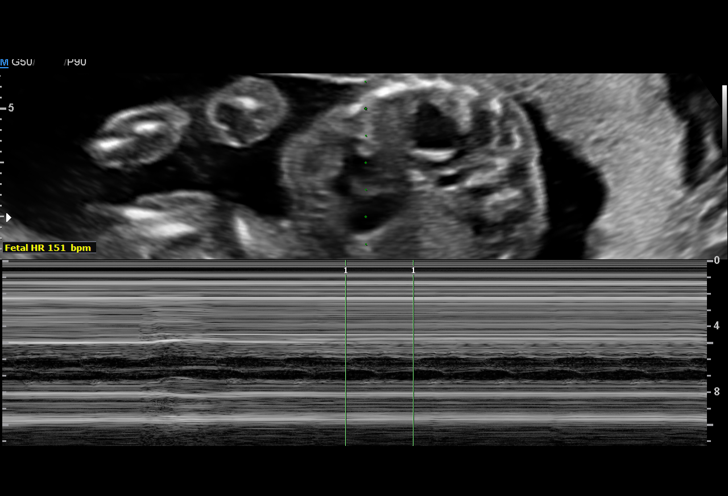
[im 79/86]
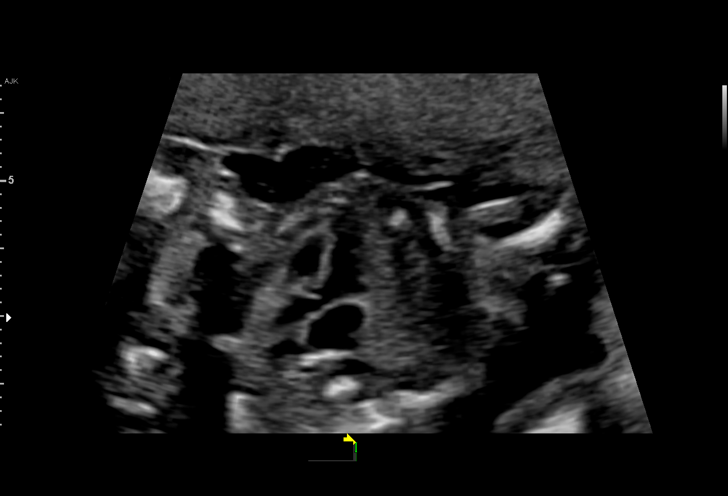
[im 86/86]
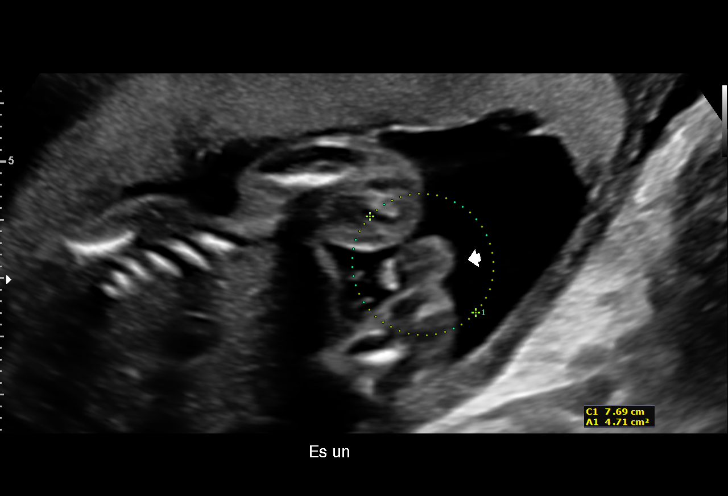

[14 of 28 positions shown; findings below may reference images not displayed]

Indications

19 weeks gestation of pregnancy
Encounter for fetal anatomic survey
OB History

Gravidity:    3         Term:   0        Prem:   0        SAB:   2
TOP:          0       Ectopic:  0        Living: 0
Fetal Evaluation

Num Of Fetuses:     1
Fetal Heart         151
Rate(bpm):
Cardiac Activity:   Observed
Presentation:       Variable
Placenta:           Anterior, above cervical os
P. Cord Insertion:  Visualized, central

Amniotic Fluid
AFI FV:      Subjectively within normal limits

Largest Pocket(cm)
4.08
Biometry

BPD:      45.5  mm     G. Age:  19w 5d         76  %    CI:        74.21   %    70 - 86
FL/HC:      18.9   %    16.1 -
HC:      167.7  mm     G. Age:  19w 3d         57  %    HC/AC:      1.09        1.09 -
AC:      154.1  mm     G. Age:  20w 4d         87  %    FL/BPD:     69.7   %
FL:       31.7  mm     G. Age:  19w 6d         69  %    FL/AC:      20.6   %    20 - 24
CER:      19.6  mm     G. Age:  18w 6d         41  %
NFT:       2.8  mm
CM:          6  mm

Est. FW:     336  gm    0 lb 12 oz      60  %
Gestational Age

LMP:           19w 1d        Date:  02/23/16                 EDD:   11/29/16
U/S Today:     19w 6d                                        EDD:   11/24/16
Best:          19w 1d     Det. By:  LMP  (02/23/16)          EDD:   11/29/16
Anatomy

Cranium:               Appears normal         Aortic Arch:            Not well visualized
Cavum:                 Appears normal         Ductal Arch:            Not well visualized
Ventricles:            Appears normal         Diaphragm:              Appears normal
Choroid Plexus:        Appears normal         Stomach:                Appears normal, left
sided
Cerebellum:            Appears normal         Abdomen:                Appears normal
Posterior Fossa:       Appears normal         Abdominal Wall:         Appears nml (cord
insert, abd wall)
Nuchal Fold:           Appears normal         Cord Vessels:           Appears normal (3
vessel cord)
Face:                  Orbits nl; profile not Kidneys:                Appear normal
well visualized
Lips:                  Appears normal         Bladder:                Appears normal
Thoracic:              Appears normal         Spine:                  Appears normal
Heart:                 Appears normal         Upper Extremities:      Appears normal
(4CH, axis, and situs
RVOT:                  Appears normal         Lower Extremities:      Appears normal
LVOT:                  Appears normal

Other:  Parents do not wish to know sex of fetus. Fetus appears to be a male.
Nasal bone visualized. Technically difficult due to fetal position.
Cervix Uterus Adnexa

Cervix
Length:            3.7  cm.
Normal appearance by transabdominal scan.

Uterus
No abnormality visualized.

Left Ovary
Not visualized.

Right Ovary
Within normal limits.

Adnexa:       No abnormality visualized.
Impression

Singleton intrauterine pregnancy at 19+1 weeks, here for
anatomic survey
Review of the anatomy shows no sonographic markers for
aneuploidy or structural anomalies
However, cardiac evaluation should be considered
suboptimal secondary to fetal position
Amniotic fluid volume is normal
Estimated fetal weight is 336g which is growth in the 60th
percentile
Recommendations

Recommend repeat scan in 4 weeks to complete anatomic
survey

## 2017-05-28 ENCOUNTER — Encounter (HOSPITAL_COMMUNITY): Payer: Self-pay | Admitting: *Deleted

## 2017-05-28 ENCOUNTER — Inpatient Hospital Stay (HOSPITAL_COMMUNITY)
Admission: AD | Admit: 2017-05-28 | Discharge: 2017-05-28 | Disposition: A | Discharge status: Home / Self Care | Payer: Medicaid Other | Source: Ambulatory Visit | Attending: Obstetrics and Gynecology | Admitting: Obstetrics and Gynecology

## 2017-05-28 DIAGNOSIS — G43001 Migraine without aura, not intractable, with status migrainosus: Secondary | ICD-10-CM | POA: Diagnosis not present

## 2017-05-28 DIAGNOSIS — Z87891 Personal history of nicotine dependence: Secondary | ICD-10-CM | POA: Diagnosis not present

## 2017-05-28 DIAGNOSIS — G43909 Migraine, unspecified, not intractable, without status migrainosus: Secondary | ICD-10-CM | POA: Insufficient documentation

## 2017-05-28 DIAGNOSIS — R51 Headache: Secondary | ICD-10-CM | POA: Diagnosis present

## 2017-05-28 DIAGNOSIS — Z87440 Personal history of urinary (tract) infections: Secondary | ICD-10-CM | POA: Diagnosis not present

## 2017-05-28 LAB — URINALYSIS, ROUTINE W REFLEX MICROSCOPIC
BILIRUBIN URINE: NEGATIVE
Glucose, UA: NEGATIVE mg/dL
Hgb urine dipstick: NEGATIVE
Ketones, ur: NEGATIVE mg/dL
Leukocytes, UA: NEGATIVE
NITRITE: NEGATIVE
PROTEIN: NEGATIVE mg/dL
SPECIFIC GRAVITY, URINE: 1.01 (ref 1.005–1.030)
pH: 7 (ref 5.0–8.0)

## 2017-05-28 LAB — RAPID URINE DRUG SCREEN, HOSP PERFORMED
AMPHETAMINES: NOT DETECTED
Barbiturates: NOT DETECTED
Benzodiazepines: NOT DETECTED
COCAINE: NOT DETECTED
OPIATES: NOT DETECTED
TETRAHYDROCANNABINOL: NOT DETECTED

## 2017-05-28 LAB — POCT PREGNANCY, URINE: Preg Test, Ur: NEGATIVE

## 2017-05-28 MED ORDER — BUTALBITAL-APAP-CAFFEINE 50-325-40 MG PO TABS
1.0000 | ORAL_TABLET | Freq: Once | ORAL | Status: AC
  Administered 2017-05-28: 1 via ORAL
  Filled 2017-05-28: qty 1

## 2017-05-28 MED ORDER — ONDANSETRON 8 MG PO TBDP
8.0000 mg | ORAL_TABLET | Freq: Once | ORAL | Status: AC
  Administered 2017-05-28: 8 mg via ORAL
  Filled 2017-05-28: qty 1

## 2017-05-28 NOTE — Discharge Instructions (Signed)
Cefalea migraosa  Migraine Headache  Una cefalea migraosa es un dolor muy intenso y punzante en uno o ambos lados de la cabeza. Las migraas tambin pueden causar otros sntomas. Hable con su mdico sobre los factores que pueden causar (desencadenar) las cefaleas migraosas.  Siga estas indicaciones en su casa:  Medicamentos   Tome los medicamentos de venta libre y los recetados solamente como se lo haya indicado el mdico.   No conduzca ni use maquinaria pesada mientras toma analgsicos recetados.   A fin de prevenir o tratar el estreimiento mientras toma analgsicos recetados, el mdico puede recomendarle lo siguiente:  ? Beber suficiente lquido para mantener el pis (orina) claro o de color amarillo plido.  ? Tomar medicamentos recetados o de venta libre.  ? Comer alimentos ricos en fibra. Entre ellos, frutas y verduras frescas, cereales integrales y frijoles.  ? Limitar los alimentos con alto contenido de grasas y azcares procesados. Estos incluyen alimentos fritos y dulces.  Estilo de vida   Evite el alcohol.   No consuma ningn producto que contenga nicotina o tabaco, como cigarrillos y cigarrillos electrnicos. Si necesita ayuda para dejar de fumar, consulte al mdico.   Duerma como mnimo 8horas todas las noches.   Evite las situaciones de estrs.  Instrucciones generales     Lleve un registro diario para averiguar lo que puede provocar la migraa. Por ejemplo, escriba:  ? Lo que usted come y bebe.  ? Cunto tiempo duerme.  ? Algn cambio en lo que come o bebe.  ? Algn cambio en sus medicamentos.   Si tiene una migraa:  ? Evite los factores que empeoren los sntomas, como las luces brillantes.  ? Resulta til acostarse en una habitacin oscura y silenciosa.  ? No conduzca vehculos ni opere maquinaria pesada.  ? Pregntele al mdico qu actividades son seguras para usted.   Concurra a todas las visitas de control como se lo haya indicado el mdico. Esto es importante.  Comunquese con un  mdico si:   Tiene una migraa que es diferente o peor de sus migraas habituales.  Solicite ayuda de inmediato si:   La migraa empeora mucho.   Tiene fiebre.   Presenta rigidez en el cuello.   Tiene dificultad para ver.   Siente debilidad en los msculos o que no puede controlarlos.   Comienza a perder el equilibrio continuamente.   Comienza a tener dificultad para caminar.   Pierde el conocimiento (se desmaya).  Esta informacin no tiene como fin reemplazar el consejo del mdico. Asegrese de hacerle al mdico cualquier pregunta que tenga.  Document Released: 08/06/2008 Document Revised: 08/17/2016 Document Reviewed: 10/27/2015  Elsevier Interactive Patient Education  2018 Elsevier Inc.

## 2017-05-28 NOTE — MAU Provider Note (Signed)
History   [age] female with hx of migraine headache in with c/o headache x 24 hours and nausea. Light sensitive. Pt has longstanding hx of headaches.  CSN: [id number]  Arrival date & time 05/28/17  1719   None     Chief Complaint  Patient presents with  . Headache    HPI  Past Medical History:  Diagnosis Date  . Pyelonephritis     Past Surgical History:  Procedure Laterality Date  . CESAREAN SECTION N/A 11/25/2016   Procedure: CESAREAN SECTION;  Surgeon: Lazaro ArmsEure, Luther H, MD;  [facility];  Service: Obstetrics;  Laterality: N/A;  . DILATION AND CURETTAGE OF UTERUS     x2    Family History  Problem Relation Age of Onset  . Diabetes Paternal Uncle   . Cancer Maternal Grandmother   . Diabetes Maternal Grandmother   . Hypertension Maternal Grandmother   . Cancer Paternal Grandmother        liver    Social History   Tobacco Use  . Smoking status: Former Games developermoker  . Smokeless tobacco: Never Used  Substance Use Topics  . Alcohol use: No    Comment: Last drank in Oct/2017, per pt drank 20 beers   . Drug use: No    OB History    Gravida Para Term Preterm AB Living   3 1 1   2 1    SAB TAB Ectopic Multiple Live Births   2     0 1      Review of Systems  Constitutional: Negative.   HENT: Negative.   Eyes: Negative.   Respiratory: Negative.   Cardiovascular: Negative.   Gastrointestinal: Positive for nausea.  Endocrine: Negative.   Genitourinary: Negative.   Musculoskeletal: Negative.   Skin: Negative.   Allergic/Immunologic: Negative.   Neurological: Positive for headaches.  Hematological: Negative.   Psychiatric/Behavioral: Negative.     Allergies  Latex  Home Medications    BP 127/66 (BP Location: Right Arm)   Pulse 63   Temp 98.6 F (37 C) (Oral)   Resp 18   Ht 5\' 4"  (1.626 m)   Wt 193 lb (87.5 kg)   LMP 05/16/2017   SpO2 97%   BMI 33.13 kg/m   Physical Exam  Constitutional: She is oriented to person, place, and time. She  appears well-developed and well-nourished.  HENT:  Head: Normocephalic.  Eyes: Pupils are equal, round, and reactive to light.  Neck: Normal range of motion.  Cardiovascular: Normal rate, regular rhythm, normal heart sounds and intact distal pulses.  Pulmonary/Chest: Effort normal and breath sounds normal.  Abdominal: Soft. Bowel sounds are normal.  Genitourinary: Vagina normal and uterus normal.  Musculoskeletal: Normal range of motion.  Neurological: She is alert and oriented to person, place, and time. She has normal reflexes.  Skin: Skin is warm and dry.  Psychiatric: She has a normal mood and affect. Her behavior is normal. Judgment and thought content normal.    MAU Course  Procedures (including critical care time)  Labs Reviewed  URINALYSIS, ROUTINE W REFLEX MICROSCOPIC  RAPID URINE DRUG SCREEN, HOSP PERFORMED  POCT PREGNANCY, URINE   No results found.  Dx: migraine headache   MDM  VSS, heart RRR, LCTAB. UPT neg. Alert and responsive x 3. Lenghty discussion of OTC meds she can use for migraines and need for her to be followed up by a primary care physician or headache specialist. Pt verbalized understanding. Will treat with fioricet and zofran and d/c home.

## 2017-05-28 NOTE — MAU Note (Signed)
Pt reports her head hurts a lot, started last pm, reports her vision is blurry. Very nauseated

## 2017-09-27 ENCOUNTER — Encounter: Payer: Self-pay | Admitting: *Deleted

## 2018-02-27 ENCOUNTER — Inpatient Hospital Stay (HOSPITAL_COMMUNITY)
Admission: AD | Admit: 2018-02-27 | Discharge: 2018-02-27 | Disposition: A | Discharge status: Home / Self Care | Payer: Medicaid Other | Source: Ambulatory Visit | Attending: Obstetrics and Gynecology | Admitting: Obstetrics and Gynecology

## 2018-02-27 ENCOUNTER — Encounter (HOSPITAL_COMMUNITY): Payer: Self-pay

## 2018-02-27 ENCOUNTER — Other Ambulatory Visit: Payer: Self-pay

## 2018-02-27 DIAGNOSIS — Z87891 Personal history of nicotine dependence: Secondary | ICD-10-CM | POA: Insufficient documentation

## 2018-02-27 DIAGNOSIS — R350 Frequency of micturition: Secondary | ICD-10-CM

## 2018-02-27 DIAGNOSIS — R300 Dysuria: Secondary | ICD-10-CM

## 2018-02-27 DIAGNOSIS — N3001 Acute cystitis with hematuria: Secondary | ICD-10-CM | POA: Insufficient documentation

## 2018-02-27 LAB — URINALYSIS, ROUTINE W REFLEX MICROSCOPIC
Bilirubin Urine: NEGATIVE
Glucose, UA: NEGATIVE mg/dL
Ketones, ur: NEGATIVE mg/dL
Nitrite: POSITIVE — AB
Protein, ur: 100 mg/dL — AB
Specific Gravity, Urine: 1.018 (ref 1.005–1.030)
WBC, UA: >50 WBC/hpf — ABNORMAL HIGH (ref 0–5)
pH: 6 (ref 5.0–8.0)

## 2018-02-27 LAB — POCT PREGNANCY, URINE: Preg Test, Ur: NEGATIVE

## 2018-02-27 MED ORDER — SULFAMETHOXAZOLE-TRIMETHOPRIM 800-160 MG PO TABS
1.0000 | ORAL_TABLET | Freq: Once | ORAL | Status: AC
  Administered 2018-02-27: 1 via ORAL
  Filled 2018-02-27: qty 1

## 2018-02-27 MED ORDER — ACETAMINOPHEN 500 MG PO TABS
1000.0000 mg | ORAL_TABLET | Freq: Once | ORAL | Status: AC
  Administered 2018-02-27: 1000 mg via ORAL
  Filled 2018-02-27: qty 2

## 2018-02-27 MED ORDER — SULFAMETHOXAZOLE-TRIMETHOPRIM 800-160 MG PO TABS: 1.0000 | ORAL_TABLET | Freq: Two times a day (BID) | ORAL | 0 refills | Status: DC

## 2018-02-27 NOTE — MAU Note (Signed)
Pt reports back pain, burning with urination and urinating blood that started 4 days ago. States she took Azo but it has not helped. Reports back pain feels like something has been pulled-changing positions makes pain better. Pt states she last took ibuprofen about 4 hours ago-"it knocked her out." Rates back pain 7/10. Pt denies fever. LMP: 02/14/2018.

## 2018-02-27 NOTE — MAU Note (Signed)
Having back pain for 4 days lower back.

## 2018-02-27 NOTE — Discharge Instructions (Signed)
Disuria (Dysuria) La disuria es dolor o molestia al orinar. El dolor o la molestia se pueden sentir en el conducto que transporta la orina fuera de la vejiga (uretra) o en el tejido que rodea los genitales. El dolor tambin se puede sentir en la zona de la ingle y en la parte inferior del abdomen y de la espalda. Quizs tenga que orinar con frecuencia o la sensacin repentina de tener que orinar (tenesmo vesical). La disuria puede afectar tanto a hombres como a mujeres, pero es ms comn en las mujeres. La causa puede deberse a muchos problemas diferentes:  Infeccin en las vas urinarias en mujeres.  Infeccin en los riones o la vejiga.  Clculos en los riones o la vejiga.  Ciertas enfermedades de transmisin sexual (ETS), como la clamidia.  Deshidratacin.  Inflamacin de la vagina.  Uso de ciertos medicamentos.  Uso de ciertos jabones o productos perfumados que provocan irritacin. INSTRUCCIONES PARA EL CUIDADO EN EL HOGAR Controle su disuria para ver si hay cambios. Las siguientes indicaciones pueden ayudar a aliviar cualquier molestia que pueda sentir:  Beba suficiente lquido para mantener la orina clara o de color amarillo plido.  Vace la vejiga con frecuencia. Evite retener la orina durante largos perodos.  Despus de defecar, las mujeres deben limpiarse desde adelante hacia atrs, usando el papel higinico solo una vez.  Vace la vejiga despus de tener relaciones sexuales.  Tome los medicamentos solamente como se lo haya indicado el mdico.  Si le recetaron antibiticos, asegrese de terminarlos, incluso si comienza a sentirse mejor.  Evite la cafena, el t y el alcohol. Estos productos pueden irritar la vejiga y empeorar la disuria. En los hombres, el alcohol puede irritar la prstata.  Concurra a todas las visitas de control como se lo haya indicado el mdico. Esto es importante.  Si le realizaron pruebas para detectar la causa de la disuria, es su  responsabilidad retirar los resultados. Consulte en el laboratorio o en el departamento en el que fue realizado el estudio cundo y cmo podr obtener los resultados. Hable con el mdico si tiene alguna pregunta sobre los resultados. SOLICITE ATENCIN MDICA SI:  Siente dolor en la espalda o a los costados del cuerpo.  Tiene fiebre.  Tiene nuseas o vmitos.  Observa sangre en la orina.  Est orinando con ms frecuencia que lo habitual. SOLICITE ATENCIN MDICA DE INMEDIATO SI:  El dolor es intenso y no se alivia con los medicamentos.  No puede retener lquido.  Usted u otra persona advierten algn cambio en su funcin mental.  Tiene una frecuencia cardaca acelerada en reposo.  Tiene temblores o escalofros.  Se siente muy dbil. Esta informacin no tiene como fin reemplazar el consejo del mdico. Asegrese de hacerle al mdico cualquier pregunta que tenga. Document Released: 05/30/2007 Document Revised: 09/01/2015 Document Reviewed: 01/03/2014 Elsevier Interactive Patient Education  2018 Elsevier Inc.  

## 2018-02-27 NOTE — MAU Provider Note (Signed)
History     CSN: [id number]  Arrival date and time: 02/27/18 2038   First Provider Initiated Contact with Patient 02/27/18 2156      Chief Complaint  Patient presents with  . Dysuria  . Back Pain  . Hematuria  . Urinary Frequency   Paula Gamble is a 22 y.o. G3P1 not currently pregnant who presents to MAU with complaints of urinary tract infection symptoms. She reports symptoms started 4 days ago. She reports urinary symptoms include back pain, urinary frequency, dysuria and hematuria. She has tried Azo at home 2 days with no relief of symptoms. She describes back pain as a pulling sensation in her lower back. Rates pain 7/10- has taken ibuprofen for back pain with some relief, position changes also helps relieve the pain. She denies hx of UTI. She denies N/V, fever or chills. Spanish interpreter used throughout visit in MAU.    OB History    Gravida  3   Para  1   Term  1   Preterm      AB  2   Living  1     SAB  2   TAB      Ectopic      Multiple  0   Live Births  1           Past Medical History:  Diagnosis Date  . Pyelonephritis     Past Surgical History:  Procedure Laterality Date  . CESAREAN SECTION N/A 11/25/2016   Procedure: CESAREAN SECTION;  Surgeon: Lazaro Arms, MD;  Location: [facility] BIRTHING SUITES;  Service: Obstetrics;  Laterality: N/A;  . DILATION AND CURETTAGE OF UTERUS     x2    Family History  Problem Relation Age of Onset  . Diabetes Paternal Uncle   . Cancer Maternal Grandmother   . Diabetes Maternal Grandmother   . Hypertension Maternal Grandmother   . Cancer Paternal Grandmother        liver    Social History   Tobacco Use  . Smoking status: Former Games developer  . Smokeless tobacco: Never Used  Substance Use Topics  . Alcohol use: No    Comment: Last drank in Oct/2017, per pt drank 20 beers   . Drug use: No    Allergies:  Allergies  Allergen Reactions  . Latex Itching    Medications Prior to Admission   Medication Sig Dispense Refill Last Dose  . ibuprofen (ADVIL,MOTRIN) 600 MG tablet Take 1 tablet (600 mg total) by mouth every 6 (six) hours as needed for moderate pain or cramping. 60 tablet 2 02/27/2018 at Unknown time  . oxyCODONE-acetaminophen (PERCOCET/ROXICET) 5-325 MG tablet Take 1-2 tablets by mouth every 6 (six) hours as needed for severe pain (pain scale 4-7). (Patient not taking: Reported on 01/06/2017) 30 tablet 0 Not Taking    Review of Systems  Constitutional: Negative.   Respiratory: Negative.   Cardiovascular: Negative.   Gastrointestinal: Negative.   Genitourinary: Positive for dysuria, frequency and hematuria. Negative for decreased urine volume, difficulty urinating, flank pain and urgency.  Musculoskeletal: Positive for back pain.  Neurological: Negative.    Physical Exam   Blood pressure 113/60, pulse 85, temperature 98.6 F (37 C), temperature source Oral, resp. rate 16, height 5\' 4"  (1.626 m), weight 80.7 kg, last menstrual period 02/14/2018, SpO2 95 %, currently breastfeeding.  Physical Exam  Nursing note and vitals reviewed. Constitutional: She is oriented to person, place, and time. She appears well-developed and well-nourished. No distress.  Cardiovascular:  Normal rate, regular rhythm and normal heart sounds.  Respiratory: Effort normal and breath sounds normal. No respiratory distress. She has no wheezes.  GI: Soft. She exhibits no distension. There is no tenderness. There is no rebound, no guarding and no CVA tenderness.  Neurological: She is alert and oriented to person, place, and time.  Skin: Skin is warm and dry.  Psychiatric: She has a normal mood and affect. Her behavior is normal. Thought content normal.    MAU Course  Procedures  MDM Orders Placed This Encounter  Procedures  . Urine Culture  . Urinalysis, Routine w reflex microscopic  . Pregnancy, urine POC   Results for orders placed or performed during the hospital encounter of 02/27/18  (from the past 48 hour(s))  Urinalysis, Routine w reflex microscopic     Status: Abnormal   Collection Time: 02/27/18  8:45 PM  Result Value Ref Range   Color, Urine AMBER (A) YELLOW    Comment: BIOCHEMICALS MAY BE AFFECTED BY COLOR   APPearance CLEAR CLEAR   Specific Gravity, Urine 1.018 1.005 - 1.030   pH 6.0 5.0 - 8.0   Glucose, UA NEGATIVE NEGATIVE mg/dL   Hgb urine dipstick SMALL (A) NEGATIVE   Bilirubin Urine NEGATIVE NEGATIVE   Ketones, ur NEGATIVE NEGATIVE mg/dL   Protein, ur 161 (A) NEGATIVE mg/dL   Nitrite POSITIVE (A) NEGATIVE   Leukocytes, UA SMALL (A) NEGATIVE   RBC / HPF 11-20 0 - 5 RBC/hpf   WBC, UA >50 (H) 0 - 5 WBC/hpf   Bacteria, UA FEW (A) NONE SEEN   Mucus PRESENT     Comment: Performed at [facility], [address], [place], [place] [address]  Pregnancy, urine POC     Status: None   Collection Time: 02/27/18  9:11 PM  Result Value Ref Range   Preg Test, Ur NEGATIVE NEGATIVE    Comment:        THE SENSITIVITY OF THIS METHODOLOGY IS >24 mIU/mL    Positive nitrates in urine, culture pending. Discussed results of labs with patient. Educated on UTI. Treatments in MAU included first dose of antibiotic due to pharmacy being closed and 1,000mg  Tylenol for back pain. Patient reports relief of back pain with medication. Pt stable at time of discharge. Rx for Bactrim sent to pharmacy of choice.   Assessment and Plan   1. Acute cystitis with hematuria   2. Dysuria   3. Urinary frequency    Discharge home  Rx for Bactrim  Discussed reasons to return to MAU  Return to urgent care or PCP for worsening symptoms  Complete entire course of medication  Urine culture pending   Allergies as of 02/27/2018      Reactions   Latex Itching      Medication List    STOP taking these medications   oxyCODONE-acetaminophen 5-325 MG tablet Commonly known as:  PERCOCET/ROXICET     TAKE these medications   ibuprofen 600 MG tablet Commonly known as:   ADVIL,MOTRIN Take 1 tablet (600 mg total) by mouth every 6 (six) hours as needed for moderate pain or cramping.   sulfamethoxazole-trimethoprim 800-160 MG tablet Commonly known as:  BACTRIM DS,SEPTRA DS Take 1 tablet by mouth 2 (two) times daily.      Sharyon Cable CNM  02/27/2018, 11:18 PM

## 2018-03-02 ENCOUNTER — Telehealth: Payer: Self-pay | Admitting: *Deleted

## 2018-03-02 ENCOUNTER — Other Ambulatory Visit: Payer: Self-pay | Admitting: Certified Nurse Midwife

## 2018-03-02 DIAGNOSIS — N390 Urinary tract infection, site not specified: Principal | ICD-10-CM

## 2018-03-02 DIAGNOSIS — B9620 Unspecified Escherichia coli [E. coli] as the cause of diseases classified elsewhere: Secondary | ICD-10-CM

## 2018-03-02 LAB — URINE CULTURE: Culture: >=100000 — AB

## 2018-03-02 MED ORDER — NITROFURANTOIN MONOHYD MACRO 100 MG PO CAPS: 100.0000 mg | ORAL_CAPSULE | Freq: Two times a day (BID) | ORAL | 0 refills | Status: DC

## 2018-03-02 NOTE — Progress Notes (Signed)
Patient was seen in MAU for UTI symptoms- tx with Bactrim. Urine culture received today noted E coli UTI with resistance to Bactrim. Rx for Macrobid sent to pharmacy for treatment.   Sharyon Cable, CNM 03/02/18, 7:50 AM

## 2018-03-02 NOTE — Telephone Encounter (Signed)
Called patient with Paula Gamble for interpreter and informed patient of culture results and new Rx sent to pharmacy. Patient verbalized understanding and had no questions.

## 2018-03-02 NOTE — Telephone Encounter (Signed)
-----   Message from Sharyon Cable, CNM sent at 03/02/2018  7:50 AM EDT ----- Patient was called, no answer- unable to leave voicemail due to busy signal. Patient was seen in MAU for UTI, given Bactrim for treatment which is resistant based on this culture. Please call the patient to notify of medication being switched from Bactrim to [facility] for treatment. Rx sent to pharmacy on file.  Thank you!  Steward Drone CNM

## 2018-03-02 NOTE — Telephone Encounter (Signed)
Called pt using interpreter number [phone] to inform her of the change in medication for her UTI.  Pt did not pick up and message was left instructing pt to call the office.

## 2018-12-25 ENCOUNTER — Ambulatory Visit: Payer: Self-pay

## 2018-12-29 ENCOUNTER — Ambulatory Visit: Payer: Self-pay

## 2019-04-24 DIAGNOSIS — U071 COVID-19: Secondary | ICD-10-CM

## 2019-04-24 HISTORY — DX: COVID-19: U07.1

## 2019-04-26 ENCOUNTER — Other Ambulatory Visit: Payer: Self-pay

## 2019-04-26 ENCOUNTER — Ambulatory Visit
Admission: EM | Admit: 2019-04-26 | Discharge: 2019-04-26 | Disposition: A | Discharge status: Home / Self Care | Payer: HRSA Program | Attending: Physician Assistant | Admitting: Physician Assistant

## 2019-04-26 DIAGNOSIS — R509 Fever, unspecified: Secondary | ICD-10-CM | POA: Diagnosis not present

## 2019-04-26 DIAGNOSIS — R059 Cough, unspecified: Secondary | ICD-10-CM

## 2019-04-26 DIAGNOSIS — Z20828 Contact with and (suspected) exposure to other viral communicable diseases: Secondary | ICD-10-CM | POA: Diagnosis not present

## 2019-04-26 DIAGNOSIS — R05 Cough: Secondary | ICD-10-CM

## 2019-04-26 MED ORDER — AMOXICILLIN-POT CLAVULANATE 875-125 MG PO TABS: 1.0000 | ORAL_TABLET | Freq: Two times a day (BID) | ORAL | 0 refills | Status: DC

## 2019-04-26 MED ORDER — ONDANSETRON 4 MG PO TBDP: 4.0000 mg | ORAL_TABLET | Freq: Three times a day (TID) | ORAL | 0 refills | Status: DC | PRN

## 2019-04-26 NOTE — ED Triage Notes (Signed)
Pt c/o cough and headache for 2 week with fever every now and then. States had a neg COVID 2wks ago.

## 2019-04-26 NOTE — Discharge Instructions (Addendum)
COVID testing ordered. I would like you to quarantine until testing results. Augmentin as directed to cover for sinus infection. Zofran as needed for nausea/vomiting. If experiencing shortness of breath, trouble breathing, go to the emergency department for further evaluation needed.

## 2019-04-26 NOTE — ED Provider Notes (Signed)
[name]    CSN: [id number] Arrival date & time: 04/26/19  1927      History   Chief Complaint Chief Complaint  Patient presents with  . Cough    HPI Paula Gamble is a 23 y.o. female.   23 year old female comes in for 2 week history of cough, headache. HPI obtained by patient via video translator. Has had nasal congestion, rhinorrhea. Recently started having fever, Tmax 104, responsive to antipyretic. Has also had chills and body aches. Nausea without vomiting. Denies abdominal pain, diarrhea. Denies shortness of breath, loss of taste/smell. Former smoker. Had negative COVID test 2 weeks ago, when symptoms first started.     Past Medical History:  Diagnosis Date  . Pyelonephritis     Patient Active Problem List   Diagnosis Date Noted  . Abnormal Pap smear of cervix 01/06/2017  . Language barrier 11/11/2016    Past Surgical History:  Procedure Laterality Date  . CESAREAN SECTION N/A 11/25/2016   Procedure: CESAREAN SECTION;  Surgeon: Lazaro Arms, MD;  Location: [facility] BIRTHING SUITES;  Service: Obstetrics;  Laterality: N/A;  . DILATION AND CURETTAGE OF UTERUS     x2    OB History    Gravida  3   Para  1   Term  1   Preterm      AB  2   Living  1     SAB  2   TAB      Ectopic      Multiple  0   Live Births  1            Home Medications    Prior to Admission medications   Medication Sig Start Date End Date Taking? Authorizing Provider  amoxicillin-clavulanate (AUGMENTIN) 875-125 MG tablet Take 1 tablet by mouth every 12 (twelve) hours. 04/26/19   Cathie Hoops, Amy V, PA-C  ibuprofen (ADVIL,MOTRIN) 600 MG tablet Take 1 tablet (600 mg total) by mouth every 6 (six) hours as needed for moderate pain or cramping. 11/29/16   Anyanwu, Jethro Bastos, MD  ondansetron (ZOFRAN ODT) 4 MG disintegrating tablet Take 1 tablet (4 mg total) by mouth every 8 (eight) hours as needed for nausea or vomiting. 04/26/19   Belinda Fisher, PA-C    Family History  Family History  Problem Relation Age of Onset  . Diabetes Paternal Uncle   . Cancer Maternal Grandmother   . Diabetes Maternal Grandmother   . Hypertension Maternal Grandmother   . Cancer Paternal Grandmother        liver    Social History Social History   Tobacco Use  . Smoking status: Former Games developer  . Smokeless tobacco: Never Used  Substance Use Topics  . Alcohol use: No    Comment: Last drank in Oct/2017, per pt drank 20 beers   . Drug use: No     Allergies   Latex   Review of Systems Review of Systems  Reason unable to perform ROS: See HPI as above.     Physical Exam Triage Vital Signs ED Triage Vitals  Enc Vitals Group     BP 04/26/19 1958 113/72     Pulse Rate 04/26/19 1958 76     Resp 04/26/19 1958 18     Temp 04/26/19 1958 98.5 F (36.9 C)     Temp Source 04/26/19 1958 Oral     SpO2 04/26/19 1958 98 %     Weight --      Height --  Head Circumference --      Peak Flow --      Pain Score 04/26/19 1959 8     Pain Loc --      Pain Edu? --      Excl. in [place]? --    No data found.  Updated Vital Signs BP 113/72 (BP Location: Left Arm)   Pulse 76   Temp 98.5 F (36.9 C) (Oral)   Resp 18   SpO2 98%   Physical Exam Constitutional:      General: She is not in acute distress.    Appearance: Normal appearance. She is not ill-appearing, toxic-appearing or diaphoretic.  HENT:     Head: Normocephalic and atraumatic.     Right Ear: Ear canal and external ear normal. Tympanic membrane is erythematous. Tympanic membrane is not bulging.     Left Ear: Tympanic membrane, ear canal and external ear normal. Tympanic membrane is not erythematous or bulging.     Nose:     Right Sinus: No maxillary sinus tenderness or frontal sinus tenderness.     Left Sinus: No maxillary sinus tenderness or frontal sinus tenderness.     Mouth/Throat:     Mouth: Mucous membranes are moist.     Pharynx: Oropharynx is clear. Uvula midline.  Neck:     Musculoskeletal:  Normal range of motion and neck supple.  Cardiovascular:     Rate and Rhythm: Normal rate and regular rhythm.     Heart sounds: Normal heart sounds. No murmur. No friction rub. No gallop.   Pulmonary:     Effort: Pulmonary effort is normal. No accessory muscle usage, prolonged expiration, respiratory distress or retractions.     Comments: Lungs clear to auscultation without adventitious lung sounds. Skin:    General: Skin is warm and dry.  Neurological:     General: No focal deficit present.     Mental Status: She is alert and oriented to person, place, and time.    UC Treatments / Results  Labs (all labs ordered are listed, but only abnormal results are displayed) Labs Reviewed  NOVEL CORONAVIRUS, NAA    EKG   Radiology No results found.  Procedures Procedures (including critical care time)  Medications Ordered in UC Medications - No data to display  Initial Impression / Assessment and Plan / UC Course  I have reviewed the triage vital signs and the nursing notes.  Pertinent labs & imaging results that were available during my care of the patient were reviewed by me and considered in my medical decision making (see chart for details).    Will retest for COVD given recently increased symptoms. COVID testing ordered.  Patient to quarantine until testing results return. No alarming signs on exam.  Patient speaking in full sentences without respiratory distress. Will add augmentin to cover for bacterial sinusitis/otitis media.    Symptomatic treatment discussed.  Push fluids.  Return precautions given.  Patient expresses understanding and agrees to plan.  Final Clinical Impressions(s) / UC Diagnoses   Final diagnoses:  Cough  Fever, unspecified   ED Prescriptions    Medication Sig Dispense Auth. Provider   amoxicillin-clavulanate (AUGMENTIN) 875-125 MG tablet Take 1 tablet by mouth every 12 (twelve) hours. 14 tablet Yu, Amy V, PA-C   ondansetron (ZOFRAN ODT) 4 MG  disintegrating tablet Take 1 tablet (4 mg total) by mouth every 8 (eight) hours as needed for nausea or vomiting. 20 tablet Ok Edwards, PA-C     PDMP not reviewed  this encounter.   Belinda FisherYu, Amy V, PA-C 04/27/19 1122

## 2019-04-30 LAB — NOVEL CORONAVIRUS, NAA: SARS-CoV-2, NAA: DETECTED — AB

## 2019-05-01 ENCOUNTER — Telehealth: Payer: Self-pay | Admitting: Emergency Medicine

## 2019-05-01 NOTE — Telephone Encounter (Signed)
Your test for COVID-19 was positive, meaning that you were infected with the novel coronavirus and could give the germ to others.  Please continue isolation at home for at least 10 days since the start of your symptoms. If you do not have symptoms, please isolate at home for 10 days from the day you were tested. Once you complete your 10 day quarantine, you may return to normal activities as long as you've not had a fever for over 24 hours(without taking fever reducing medicine) and your symptoms are improving. Please continue good preventive care measures, including:  frequent hand-washing, avoid touching your face, cover coughs/sneezes, stay out of crowds and keep a 6 foot distance from others.  Go to the nearest hospital emergency room if fever/cough/breathlessness are severe or illness seems like a threat to life.  Patient contacted by phone with spanish interpreter and made aware of    results. Pt verbalized understanding and had all questions answered. Quarantine ends Dec 12th

## 2019-09-19 ENCOUNTER — Encounter: Payer: Self-pay | Admitting: Family Medicine

## 2019-09-19 ENCOUNTER — Ambulatory Visit (INDEPENDENT_AMBULATORY_CARE_PROVIDER_SITE_OTHER): Payer: Self-pay | Admitting: *Deleted

## 2019-09-19 ENCOUNTER — Other Ambulatory Visit: Payer: Self-pay

## 2019-09-19 DIAGNOSIS — Z3200 Encounter for pregnancy test, result unknown: Secondary | ICD-10-CM

## 2019-09-19 LAB — POCT PREGNANCY, URINE: Preg Test, Ur: POSITIVE — AB

## 2019-09-19 NOTE — Progress Notes (Signed)
Interpreter Paula Gamble present for encounter. Pt informed of +UPT today. Pt is unsure of LMP today however she had previously entered her LMP into a pregnancy app on her phone which is showing [redacted]w[redacted]d EGA for today. This would be consistent with LMP 08/04/19. EDD 05/10/20. Pt advised to continue taking prenatal vitamins. She will schedule prenatal care with this office. Pt voiced understanding of all information and instructions given.

## 2019-09-21 NOTE — Progress Notes (Signed)
Patient ID: Paula Gamble, female   DOB: [birth year], 24 y.o.   MRN: [id number] Patient seen and assessed by nursing staff during this encounter. I have reviewed the chart and agree with the documentation and plan. I have also made any necessary editorial changes.  Scheryl Darter, MD 09/21/2019 4:22 PM

## 2019-09-26 ENCOUNTER — Inpatient Hospital Stay (HOSPITAL_COMMUNITY): Payer: Self-pay

## 2019-09-26 ENCOUNTER — Inpatient Hospital Stay (HOSPITAL_COMMUNITY)
Admission: AD | Admit: 2019-09-26 | Discharge: 2019-09-26 | Disposition: A | Discharge status: Home / Self Care | Payer: Self-pay | Attending: Obstetrics and Gynecology | Admitting: Obstetrics and Gynecology

## 2019-09-26 ENCOUNTER — Encounter (HOSPITAL_COMMUNITY): Payer: Self-pay | Admitting: *Deleted

## 2019-09-26 DIAGNOSIS — Z87891 Personal history of nicotine dependence: Secondary | ICD-10-CM | POA: Insufficient documentation

## 2019-09-26 DIAGNOSIS — R109 Unspecified abdominal pain: Secondary | ICD-10-CM

## 2019-09-26 DIAGNOSIS — Z789 Other specified health status: Secondary | ICD-10-CM

## 2019-09-26 DIAGNOSIS — O26891 Other specified pregnancy related conditions, first trimester: Secondary | ICD-10-CM | POA: Insufficient documentation

## 2019-09-26 DIAGNOSIS — Z3A01 Less than 8 weeks gestation of pregnancy: Secondary | ICD-10-CM | POA: Insufficient documentation

## 2019-09-26 DIAGNOSIS — R1032 Left lower quadrant pain: Secondary | ICD-10-CM | POA: Insufficient documentation

## 2019-09-26 LAB — URINALYSIS, ROUTINE W REFLEX MICROSCOPIC
Bilirubin Urine: NEGATIVE
Glucose, UA: NEGATIVE mg/dL
Hgb urine dipstick: NEGATIVE
Ketones, ur: 20 mg/dL — AB
Leukocytes,Ua: NEGATIVE
Nitrite: NEGATIVE
Protein, ur: NEGATIVE mg/dL
Specific Gravity, Urine: 1.013 (ref 1.005–1.030)
pH: 7 (ref 5.0–8.0)

## 2019-09-26 LAB — CBC
HCT: 39.1 % (ref 36.0–46.0)
Hemoglobin: 13.2 g/dL (ref 12.0–15.0)
MCH: 31.4 pg (ref 26.0–34.0)
MCHC: 33.8 g/dL (ref 30.0–36.0)
MCV: 92.9 fL (ref 80.0–100.0)
Platelets: 166 10*3/uL (ref 150–400)
RBC: 4.21 MIL/uL (ref 3.87–5.11)
RDW: 12 % (ref 11.5–15.5)
WBC: 13 10*3/uL — ABNORMAL HIGH (ref 4.0–10.5)
nRBC: 0 % (ref 0.0–0.2)

## 2019-09-26 LAB — WET PREP, GENITAL
Sperm: NONE SEEN
Trich, Wet Prep: NONE SEEN

## 2019-09-26 LAB — HCG, QUANTITATIVE, PREGNANCY: hCG, Beta Chain, Quant, S: 50109 m[IU]/mL — ABNORMAL HIGH (ref <5)

## 2019-09-26 MED ORDER — HYDROMORPHONE HCL 1 MG/ML IJ SOLN
1.0000 mg | Freq: Once | INTRAMUSCULAR | Status: AC
  Administered 2019-09-26: 1 mg via INTRAMUSCULAR
  Filled 2019-09-26: qty 1

## 2019-09-26 MED ORDER — PROMETHAZINE HCL 25 MG/ML IJ SOLN
12.5000 mg | Freq: Once | INTRAMUSCULAR | Status: AC
  Administered 2019-09-26: 22:00:00 12.5 mg via INTRAMUSCULAR
  Filled 2019-09-26: qty 1

## 2019-09-26 MED ORDER — PROMETHAZINE HCL 12.5 MG PO TABS: 12.5000 mg | ORAL_TABLET | Freq: Four times a day (QID) | ORAL | 1 refills | Status: DC | PRN

## 2019-09-26 MED ORDER — METRONIDAZOLE 0.75 % VA GEL: 1.0000 | Freq: Two times a day (BID) | VAGINAL | 0 refills | Status: AC

## 2019-09-26 MED ORDER — FLUCONAZOLE 150 MG PO TABS
150.0000 mg | ORAL_TABLET | Freq: Once | ORAL | Status: AC
  Administered 2019-09-26: 23:00:00 150 mg via ORAL
  Filled 2019-09-26: qty 1

## 2019-09-26 NOTE — Progress Notes (Signed)
WRitten and verbal d/c instructions given using Status interpretor Alecia Lemming [phone] and understanding voiced by pt.

## 2019-09-26 NOTE — MAU Provider Note (Addendum)
Chief Complaint: Abdominal Pain   First Provider Initiated Contact with Patient 09/26/19 2104     *Spanish interpreter at bedside for this encounter*  SUBJECTIVE HPI: Paula Gamble is a 24 y.o. [id number] at Unknown who presents to Maternity Admissions reporting abdominal pain. Reports sudden onset of left lower quadrant pain at around 530 pm today. Pain is constant & worse when lying flat. Laying on her left side helps relieve the pain somewhat. Also has some nausea, no vomiting. Denies fever/chills, dysuria, vaginal bleeding, or vaginal discharge.   Location: abdomen Quality: cramping Severity: 8/10 on pain scale Duration: 4 hours Timing: constant Modifying factors: worse lying flat, better lying on left side Associated signs and symptoms: nausea  Past Medical History:  Diagnosis Date  . Pyelonephritis    OB History  Gravida Para Term Preterm AB Living  4 1 1   2 1   SAB TAB Ectopic Multiple Live Births  2     0 1    # Outcome Date GA Lbr Len/2nd Weight Sex Delivery Anes PTL Lv  4 Current           3 Term 11/25/16 [redacted]w[redacted]d  3901 g M CS-LTranv EPI  LIV  2 SAB           1 SAB            Past Surgical History:  Procedure Laterality Date  . ABDOMINOPLASTY    . CESAREAN SECTION N/A 11/25/2016   Procedure: CESAREAN SECTION;  Surgeon: Florian Buff, MD;  Location: [facility];  Service: Obstetrics;  Laterality: N/A;  . DILATION AND CURETTAGE OF UTERUS     x2   Social History   Socioeconomic History  . Marital status: Married    Spouse name: Not on file  . Number of children: Not on file  . Years of education: Not on file  . Highest education level: Not on file  Occupational History  . Not on file  Tobacco Use  . Smoking status: Former Research scientist (life sciences)  . Smokeless tobacco: Never Used  Substance and Sexual Activity  . Alcohol use: No    Comment: Last drank in Oct/2017, per pt drank 20 beers   . Drug use: No  . Sexual activity: Yes    Birth control/protection: None   Other Topics Concern  . Not on file  Social History Narrative  . Not on file   Social Determinants of Health   Financial Resource Strain:   . Difficulty of Paying Living Expenses:   Food Insecurity:   . Worried About Charity fundraiser in the Last Year:   . Arboriculturist in the Last Year:   Transportation Needs:   . Film/video editor (Medical):   Marland Kitchen Lack of Transportation (Non-Medical):   Physical Activity:   . Days of Exercise per Week:   . Minutes of Exercise per Session:   Stress:   . Feeling of Stress :   Social Connections:   . Frequency of Communication with Friends and Family:   . Frequency of Social Gatherings with Friends and Family:   . Attends Religious Services:   . Active Member of Clubs or Organizations:   . Attends Archivist Meetings:   Marland Kitchen Marital Status:   Intimate Partner Violence:   . Fear of Current or Ex-Partner:   . Emotionally Abused:   Marland Kitchen Physically Abused:   . Sexually Abused:    Family History  Problem Relation Age of Onset  .  Diabetes Paternal Uncle   . Cancer Maternal Grandmother   . Diabetes Maternal Grandmother   . Hypertension Maternal Grandmother   . Cancer Paternal Grandmother        liver   No current facility-administered medications on file prior to encounter.   Current Outpatient Medications on File Prior to Encounter  Medication Sig Dispense Refill  . acetaminophen (TYLENOL) 500 MG tablet Take 1,000 mg by mouth every 6 (six) hours as needed.    . Prenatal Vit-Fe Fumarate-FA (MULTIVITAMIN-PRENATAL) 27-0.8 MG TABS tablet Take 1 tablet by mouth daily at 12 noon.    . ondansetron (ZOFRAN ODT) 4 MG disintegrating tablet Take 1 tablet (4 mg total) by mouth every 8 (eight) hours as needed for nausea or vomiting. (Patient not taking: Reported on 09/19/2019) 20 tablet 0   Allergies  Allergen Reactions  . Latex Itching    I have reviewed patient's Past Medical Hx, Surgical Hx, Family Hx, Social Hx, medications and  allergies.   Review of Systems  Constitutional: Negative.   Gastrointestinal: Positive for abdominal pain and nausea. Negative for constipation, diarrhea and vomiting.  Genitourinary: Negative.     OBJECTIVE Patient Vitals for the past 24 hrs:  BP Temp Pulse Resp Height Weight  09/26/19 2052 120/65 -- 74 -- -- --  09/26/19 2050 -- 98.8 F (37.1 C) -- 18 5\' 4"  (1.626 m) 78 kg   Constitutional: Patient tearful. Well-developed, well-nourished female. Cardiovascular: normal rate & rhythm, no murmur Respiratory: normal rate and effort. Lung sounds clear throughout GI: TTP in LLQ with guarding. Abdomen soft. No rebound.  MS: Extremities nontender, no edema, normal ROM Neurologic: Alert and oriented x 4.     LAB RESULTS No results found for this or any previous visit (from the past 24 hour(s)).  IMAGING No results found.  MAU COURSE Orders Placed This Encounter  Procedures  . Wet prep, genital  . OB LESS THAN 14 WEEKS WITH OB TRANSVAGINAL  . Urinalysis, Routine w reflex microscopic  . CBC  . hCG, quantitative, pregnancy  . Diet NPO time specified   Meds ordered this encounter  Medications  . HYDROmorphone (DILAUDID) injection 1 mg  . promethazine (PHENERGAN) injection 12.5 mg    MDM +UPT UA, wet prep, GC/chlamydia, CBC, ABO/Rh, quant hCG, and [place] today to rule out ectopic pregnancy which can be life threatening.   Patient sent to ultrasound Care turned over to Dr. [place], Rutherford Nail, NP 09/26/2019  9:46 PM    11/26/2019 shows IUP without any other remarkable findings. UA unremarkable w/o blood, wet prep shows +yeast and +BV. WBC mildly elevated but otherwise normal CBC. On re-examination patient was found to be sleeping, on awakening she was pain free with benign and pain free abdominal exam.   Unclear etiology of her pain, given L sided ?constipation?. Essentially ruled out for ovarian cyst/rupture, nephrolithiasis, ectopic, etc. Given resolution of pain and ruled  out for life threatening etiologies safe for d/c to home.   Given dose of PO Diflucan 150mg  prior to d/c, rx sent for flagyl for BV as well as phenergan per patient request.   [place] MD/MPH 10:45 PM

## 2019-09-26 NOTE — MAU Note (Signed)
Having pain LLQ of abdomin since 1700. Denies VB. White creamy d/c. Took Tylenol 2 xst at 1730 but did not help

## 2019-09-27 LAB — GC/CHLAMYDIA PROBE AMP (~~LOC~~) NOT AT ARMC
Chlamydia: NEGATIVE
Comment: NEGATIVE
Comment: NORMAL
Neisseria Gonorrhea: NEGATIVE

## 2019-10-04 ENCOUNTER — Encounter (HOSPITAL_COMMUNITY): Payer: Self-pay | Admitting: Obstetrics and Gynecology

## 2019-10-04 ENCOUNTER — Inpatient Hospital Stay (HOSPITAL_COMMUNITY)
Admission: AD | Admit: 2019-10-04 | Discharge: 2019-10-05 | Disposition: A | Discharge status: Home / Self Care | Payer: Self-pay | Attending: Obstetrics and Gynecology | Admitting: Obstetrics and Gynecology

## 2019-10-04 DIAGNOSIS — Z91040 Latex allergy status: Secondary | ICD-10-CM | POA: Insufficient documentation

## 2019-10-04 DIAGNOSIS — O219 Vomiting of pregnancy, unspecified: Secondary | ICD-10-CM | POA: Insufficient documentation

## 2019-10-04 DIAGNOSIS — Z87891 Personal history of nicotine dependence: Secondary | ICD-10-CM | POA: Insufficient documentation

## 2019-10-04 DIAGNOSIS — Z3A08 8 weeks gestation of pregnancy: Secondary | ICD-10-CM | POA: Insufficient documentation

## 2019-10-04 DIAGNOSIS — Z789 Other specified health status: Secondary | ICD-10-CM

## 2019-10-04 NOTE — MAU Note (Signed)
PT SAYS WITH INTERPRETER-  ABEL [phone]. PT SAYS HAS BEEN VOMITING - TODAY 7-8X. .  MOM MADE HER DRINK MILK- VOMITED..- WITH BLOOD IN MUCUS. - NOW INTERPRETER MARIELA.    TOOK PHENERGAN-  TOOK AT 6PM.

## 2019-10-05 DIAGNOSIS — Z3A08 8 weeks gestation of pregnancy: Secondary | ICD-10-CM

## 2019-10-05 DIAGNOSIS — O219 Vomiting of pregnancy, unspecified: Secondary | ICD-10-CM

## 2019-10-05 LAB — COMPREHENSIVE METABOLIC PANEL
ALT: 20 U/L (ref 0–44)
AST: 21 U/L (ref 15–41)
Albumin: 3.9 g/dL (ref 3.5–5.0)
Alkaline Phosphatase: 46 U/L (ref 38–126)
Anion gap: 7 (ref 5–15)
BUN: 6 mg/dL (ref 6–20)
CO2: 24 mmol/L (ref 22–32)
Calcium: 9.6 mg/dL (ref 8.9–10.3)
Chloride: 105 mmol/L (ref 98–111)
Creatinine, Ser: 0.58 mg/dL (ref 0.44–1.00)
GFR calc Af Amer: >60 mL/min (ref >60)
GFR calc non Af Amer: >60 mL/min (ref >60)
Glucose, Bld: 84 mg/dL (ref 70–99)
Potassium: 3.7 mmol/L (ref 3.5–5.1)
Sodium: 136 mmol/L (ref 135–145)
Total Bilirubin: 0.5 mg/dL (ref 0.3–1.2)
Total Protein: 6.6 g/dL (ref 6.5–8.1)

## 2019-10-05 LAB — URINALYSIS, ROUTINE W REFLEX MICROSCOPIC
Bacteria, UA: NONE SEEN
Bilirubin Urine: NEGATIVE
Glucose, UA: NEGATIVE mg/dL
Hgb urine dipstick: NEGATIVE
Ketones, ur: NEGATIVE mg/dL
Nitrite: NEGATIVE
Protein, ur: NEGATIVE mg/dL
Specific Gravity, Urine: 1.002 — ABNORMAL LOW (ref 1.005–1.030)
pH: 7 (ref 5.0–8.0)

## 2019-10-05 MED ORDER — METOCLOPRAMIDE HCL 5 MG/ML IJ SOLN
10.0000 mg | Freq: Once | INTRAMUSCULAR | Status: AC
  Administered 2019-10-05: 10 mg via INTRAVENOUS
  Filled 2019-10-05: qty 2

## 2019-10-05 MED ORDER — DEXAMETHASONE SODIUM PHOSPHATE 10 MG/ML IJ SOLN
10.0000 mg | Freq: Once | INTRAMUSCULAR | Status: AC
  Administered 2019-10-05: 10 mg via INTRAVENOUS
  Filled 2019-10-05: qty 1

## 2019-10-05 MED ORDER — SCOPOLAMINE 1 MG/3DAYS TD PT72
1.0000 | MEDICATED_PATCH | TRANSDERMAL | Status: DC
  Administered 2019-10-05: 1.5 mg via TRANSDERMAL
  Filled 2019-10-05: qty 1

## 2019-10-05 MED ORDER — FAMOTIDINE IN NACL 20-0.9 MG/50ML-% IV SOLN
20.0000 mg | Freq: Once | INTRAVENOUS | Status: AC
  Administered 2019-10-05: 20 mg via INTRAVENOUS
  Filled 2019-10-05: qty 50

## 2019-10-05 MED ORDER — ONDANSETRON 4 MG PO TBDP: 4.0000 mg | ORAL_TABLET | Freq: Four times a day (QID) | ORAL | 0 refills | Status: DC | PRN

## 2019-10-05 MED ORDER — PROMETHAZINE HCL 25 MG/ML IJ SOLN: 12.5000 mg | Freq: Once | INTRAMUSCULAR | Status: DC

## 2019-10-05 MED ORDER — LACTATED RINGERS IV SOLN: Freq: Once | INTRAVENOUS | Status: AC

## 2019-10-05 NOTE — MAU Note (Signed)
PO ICE

## 2019-10-05 NOTE — MAU Provider Note (Signed)
Chief Complaint: Emesis   First Provider Initiated Contact with Patient 10/05/19 0011       Interpretor used (video)  SUBJECTIVE HPI: Paula Gamble is a 24 y.o. [id number] at [redacted]w[redacted]d by LMP who presents to maternity admissions reporting nausea and vomiting.  Vomited twice since this afternoon.  Took Phenergan at 6pm.  . She denies vaginal bleeding, vaginal itching/burning, urinary symptoms, h/a, dizziness, or fever/chills.    Emesis  This is a recurrent problem. The current episode started in the past 7 days. The problem occurs 5 to 10 times per day. The problem has been unchanged. There has been no fever. Pertinent negatives include no abdominal pain, chills, diarrhea, dizziness, fever or myalgias. Treatments tried: phenergan. The treatment provided no relief.   RN Note: PT SAYS WITH INTERPRETER-  ABEL [phone]. PT SAYS HAS BEEN VOMITING - TODAY 7-8X. .  MOM MADE HER DRINK MILK- VOMITED..- WITH BLOOD IN MUCUS. - NOW INTERPRETER MARIELA.    TOOK PHENERGAN-  TOOK AT 6PM.    Past Medical History:  Diagnosis Date  . Pyelonephritis    Past Surgical History:  Procedure Laterality Date  . ABDOMINOPLASTY    . CESAREAN SECTION N/A 11/25/2016   Procedure: CESAREAN SECTION;  Surgeon: Lazaro Arms, MD;  Location: [facility] BIRTHING SUITES;  Service: Obstetrics;  Laterality: N/A;  . DILATION AND CURETTAGE OF UTERUS     x2   Social History   Socioeconomic History  . Marital status: Married    Spouse name: Not on file  . Number of children: Not on file  . Years of education: Not on file  . Highest education level: Not on file  Occupational History  . Not on file  Tobacco Use  . Smoking status: Former Games developer  . Smokeless tobacco: Never Used  Substance and Sexual Activity  . Alcohol use: No    Comment: Last drank in Oct/2017, per pt drank 20 beers   . Drug use: No  . Sexual activity: Yes    Birth control/protection: None  Other Topics Concern  . Not on file  Social History Narrative  . Not  on file   Social Determinants of Health   Financial Resource Strain:   . Difficulty of Paying Living Expenses:   Food Insecurity:   . Worried About Programme researcher, broadcasting/film/video in the Last Year:   . Barista in the Last Year:   Transportation Needs:   . Freight forwarder (Medical):   Marland Kitchen Lack of Transportation (Non-Medical):   Physical Activity:   . Days of Exercise per Week:   . Minutes of Exercise per Session:   Stress:   . Feeling of Stress :   Social Connections:   . Frequency of Communication with Friends and Family:   . Frequency of Social Gatherings with Friends and Family:   . Attends Religious Services:   . Active Member of Clubs or Organizations:   . Attends Banker Meetings:   Marland Kitchen Marital Status:   Intimate Partner Violence:   . Fear of Current or Ex-Partner:   . Emotionally Abused:   Marland Kitchen Physically Abused:   . Sexually Abused:    No current facility-administered medications on file prior to encounter.   Current Outpatient Medications on File Prior to Encounter  Medication Sig Dispense Refill  . acetaminophen (TYLENOL) 500 MG tablet Take 1,000 mg by mouth every 6 (six) hours as needed.    . Prenatal Vit-Fe Fumarate-FA (MULTIVITAMIN-PRENATAL) 27-0.8 MG TABS tablet Take  1 tablet by mouth daily at 12 noon.    . promethazine (PHENERGAN) 12.5 MG tablet Take 1 tablet (12.5 mg total) by mouth every 6 (six) hours as needed for nausea or vomiting. 30 tablet 1   Allergies  Allergen Reactions  . Latex Itching    I have reviewed patient's Past Medical Hx, Surgical Hx, Family Hx, Social Hx, medications and allergies.   ROS:  Review of Systems  Constitutional: Negative for chills and fever.  Gastrointestinal: Positive for vomiting. Negative for abdominal pain and diarrhea.  Musculoskeletal: Negative for myalgias.  Neurological: Negative for dizziness.   Review of Systems  Other systems negative   Physical Exam  Physical Exam Patient Vitals for the past  24 hrs:  BP Temp Temp src Pulse Resp Height Weight  10/05/19 0000 117/62 99.3 F (37.4 C) Oral 80 18 5\' 4"  (1.626 m) 78.7 kg   Constitutional: Well-developed, well-nourished female in no acute distress. No active vomiting. Cardiovascular: normal rate Respiratory: normal effort GI: Abd soft, non-tender. Pos BS x 4 MS: Extremities nontender, no edema, normal ROM Neurologic: Alert and oriented x 4.  GU: Neg CVAT.  PELVIC EXAM: deferred  LAB RESULTS Results for orders placed or performed during the hospital encounter of 10/04/19 (from the past 24 hour(s))  Urinalysis, Routine w reflex microscopic     Status: Abnormal   Collection Time: 10/05/19 12:07 AM  Result Value Ref Range   Color, Urine STRAW (A) YELLOW   APPearance CLEAR CLEAR   Specific Gravity, Urine 1.002 (L) 1.005 - 1.030   pH 7.0 5.0 - 8.0   Glucose, UA NEGATIVE NEGATIVE mg/dL   Hgb urine dipstick NEGATIVE NEGATIVE   Bilirubin Urine NEGATIVE NEGATIVE   Ketones, ur NEGATIVE NEGATIVE mg/dL   Protein, ur NEGATIVE NEGATIVE mg/dL   Nitrite NEGATIVE NEGATIVE   Leukocytes,Ua SMALL (A) NEGATIVE   RBC / HPF 0-5 0 - 5 RBC/hpf   WBC, UA 0-5 0 - 5 WBC/hpf   Bacteria, UA NONE SEEN NONE SEEN   Squamous Epithelial / LPF 0-5 0 - 5  Comprehensive metabolic panel     Status: None   Collection Time: 10/05/19 12:11 AM  Result Value Ref Range   Sodium 136 135 - 145 mmol/L   Potassium 3.7 3.5 - 5.1 mmol/L   Chloride 105 98 - 111 mmol/L   CO2 24 22 - 32 mmol/L   Glucose, Bld 84 70 - 99 mg/dL   BUN 6 6 - 20 mg/dL   Creatinine, Ser 10/07/19 0.44 - 1.00 mg/dL   Calcium 9.6 8.9 - 2.42 mg/dL   Total Protein 6.6 6.5 - 8.1 g/dL   Albumin 3.9 3.5 - 5.0 g/dL   AST 21 15 - 41 U/L   ALT 20 0 - 44 U/L   Alkaline Phosphatase 46 38 - 126 U/L   Total Bilirubin 0.5 0.3 - 1.2 mg/dL   GFR calc non Af Amer >60 >60 mL/min   GFR calc Af Amer >60 >60 mL/min   Anion gap 7 5 - 15   IMAGING   MAU Management/MDM: Ordered IV hydration and several  medications including Decadron, Pepcid, Reglan and scopolamine patch. Her urine was very dilute, indicating good hydration CMET sent to eval for LFTs and hypokalemia,  These values were all normal   ASSESSMENT Single IUP at [redacted]w[redacted]d Nausea and vomiting of pregnancy No significant dehydration  PLAN Discharge home Will add Rx for Zofran for prn use at home if Phenergan does not work Continue previous  meds as directed Advance diet as tolerated  Pt stable at time of discharge. Encouraged to return here or to other Urgent Care/ED if she develops worsening of symptoms, increase in pain, fever, or other concerning symptoms.    Hansel Feinstein CNM, MSN Certified Nurse-Midwife 10/05/2019  12:11 AM

## 2019-10-05 NOTE — Discharge Instructions (Signed)
Nuseas matinales Morning Sickness  Se denomina nuseas matinales a las ganas de vomitar de las mujeres durante el embarazo. Esta sensacin puede estar acompaada o no de vmitos. Suelen aparecer por la maana, pero pueden ser un problema a lo largo de todo el da. Las nuseas matinales son ms frecuentes durante el primer trimestre. En algunos casos, podran continuar durante todo el embarazo. Aunque son molestas, generalmente, no causan ningn dao, excepto que una mujer presente vmitos continuos e intensos (hipermesis gravdica), una afeccin que requiere un tratamiento ms intenso. Cules son las causas? La causa exacta de esta afeccin no se conoce, pero estas parecen estar relacionadas con los cambios hormonales normales que ocurren durante el embarazo. Qu incrementa el riesgo? Es ms probable que usted sufra esta afeccin si:  Tena nuseas o vmitos antes de quedar embarazada.  Tuvo nuseas matinales en algn embarazo anterior.  Est embarazada de ms de un beb, por ejemplo, mellizos. Cules son los signos o los sntomas? Los sntomas de esta afeccin incluyen los siguientes:  Nuseas.  Vmitos. Cmo se diagnostica? Esta afeccin suele diagnosticarse en funcin de los signos y los sntomas. Cmo se trata? En muchos casos, no se requiere tratamiento para esta afeccin. Hacer algunos cambios en su dieta podra ayudar a controlar los sntomas. El mdico tambin podra recetarle o recomendarle lo siguiente:  Suplementos de vitamina B6.  Medicamentos para las nauseas.  Jengibre. Siga estas indicaciones en su casa: Medicamentos  Tome los medicamentos de venta libre y los recetados solamente como se lo haya indicado el mdico. No utilice ningn medicamento recetado, de venta libre ni herbario para tratar este problema sin consultar con su mdico antes.  Tomar un multivitamnico antes de quedar embarazada puede prevenir o disminuir la gravedad de las nuseas matinales en  la mayora de las mujeres. Comida y bebida  Coma un trozo de tostada o galletas secas antes de levantarse de la cama por la maana.  Coma 5 o 6 comidas pequeas por da.  Consuma alimentos blandos y secos, como arroz o papas asadas. Los alimentos ricos en carbohidratos generalmente ayudan.  Evite los alimentos muy grasos y condimentados.  Pdale a otra persona que cocine por usted si el olor de algn alimento le provoca nuseas o vmitos.  Si tiene ganas de vomitar despus de tomar las vitaminas prenatales, tmelas a la noche o con una colacin.  Tome colaciones de alimentos proteicos entre comidas si siente apetito. Los frutos secos, el yogur y el queso son buenas opciones.  Beba lquidos durante todo el da.  Pruebe a tomar gaseosa de jengibre hecha con jengibre natural, t de jengibre hecho con jengibre fresco rallado o caramelos de jengibre. Instrucciones generales  No consuma ningn producto que contenga nicotina o tabaco, como cigarrillos y cigarrillos electrnicos. Si necesita ayuda para dejar de fumar, consulte al mdico.  Consiga un purificador de aire para mantener el aire de su casa libre de olores.  Trate de respirar aire fresco.  Intente evitar los olores que le provocan nuseas.  Considere la posibilidad de intentar los siguientes mtodos para aliviar los sntomas: ? Usar una pulsera de acupresin. Estas pulseras suelen usarse contra los mareos. ? Acupuntura. Comunquese con un mdico si:  Los remedios caseros no funcionan, y necesita medicamentos.  Se siente mareada o que va a desvanecerse.  Pierde peso. Solicite ayuda de inmediato si:  Tiene nuseas y vmitos de manera persistente y no puede controlarlos.  Se desmaya.  Siente un dolor intenso en el abdomen. Resumen    Se denomina nuseas matinales a las ganas de vomitar de las mujeres durante el embarazo. Esta sensacin puede estar acompaada o no de vmitos.  Las nuseas matinales son ms frecuentes  durante el primer trimestre.  Suelen aparecer por la maana, pero pueden ser un problema a lo largo de todo el da.  En muchos casos, no se requiere tratamiento para esta afeccin. Hacer algunos cambios en su dieta podra ayudar a controlar los sntomas. Esta informacin no tiene como fin reemplazar el consejo del mdico. Asegrese de hacerle al mdico cualquier pregunta que tenga. Document Revised: 02/07/2017 Document Reviewed: 02/07/2017 Elsevier Patient Education  2020 Elsevier Inc.  

## 2019-10-24 ENCOUNTER — Inpatient Hospital Stay (HOSPITAL_COMMUNITY)
Admission: AD | Admit: 2019-10-24 | Discharge: 2019-10-25 | Disposition: A | Discharge status: Home / Self Care | Payer: Self-pay | Attending: Family Medicine | Admitting: Family Medicine

## 2019-10-24 ENCOUNTER — Encounter (HOSPITAL_COMMUNITY): Payer: Self-pay | Admitting: Family Medicine

## 2019-10-24 ENCOUNTER — Other Ambulatory Visit: Payer: Self-pay

## 2019-10-24 DIAGNOSIS — R519 Headache, unspecified: Secondary | ICD-10-CM | POA: Insufficient documentation

## 2019-10-24 DIAGNOSIS — B9689 Other specified bacterial agents as the cause of diseases classified elsewhere: Secondary | ICD-10-CM | POA: Insufficient documentation

## 2019-10-24 DIAGNOSIS — O210 Mild hyperemesis gravidarum: Secondary | ICD-10-CM

## 2019-10-24 DIAGNOSIS — N898 Other specified noninflammatory disorders of vagina: Secondary | ICD-10-CM | POA: Insufficient documentation

## 2019-10-24 DIAGNOSIS — K5900 Constipation, unspecified: Secondary | ICD-10-CM | POA: Insufficient documentation

## 2019-10-24 DIAGNOSIS — K5901 Slow transit constipation: Secondary | ICD-10-CM

## 2019-10-24 DIAGNOSIS — O99611 Diseases of the digestive system complicating pregnancy, first trimester: Secondary | ICD-10-CM | POA: Insufficient documentation

## 2019-10-24 DIAGNOSIS — Z3A11 11 weeks gestation of pregnancy: Secondary | ICD-10-CM | POA: Insufficient documentation

## 2019-10-24 DIAGNOSIS — O23591 Infection of other part of genital tract in pregnancy, first trimester: Secondary | ICD-10-CM | POA: Insufficient documentation

## 2019-10-24 DIAGNOSIS — G43109 Migraine with aura, not intractable, without status migrainosus: Secondary | ICD-10-CM

## 2019-10-24 DIAGNOSIS — O26891 Other specified pregnancy related conditions, first trimester: Secondary | ICD-10-CM | POA: Insufficient documentation

## 2019-10-24 LAB — URINALYSIS, ROUTINE W REFLEX MICROSCOPIC
Bacteria, UA: NONE SEEN
Bilirubin Urine: NEGATIVE
Glucose, UA: NEGATIVE mg/dL
Hgb urine dipstick: NEGATIVE
Ketones, ur: 80 mg/dL — AB
Nitrite: NEGATIVE
Protein, ur: NEGATIVE mg/dL
Specific Gravity, Urine: 1.011 (ref 1.005–1.030)
pH: 6 (ref 5.0–8.0)

## 2019-10-24 MED ORDER — DIPHENHYDRAMINE HCL 50 MG/ML IJ SOLN
25.0000 mg | Freq: Once | INTRAMUSCULAR | Status: AC
  Administered 2019-10-24: 25 mg via INTRAVENOUS
  Filled 2019-10-24: qty 1

## 2019-10-24 MED ORDER — LACTATED RINGERS IV BOLUS
1000.0000 mL | Freq: Once | INTRAVENOUS | Status: AC
  Administered 2019-10-24: 1000 mL via INTRAVENOUS

## 2019-10-24 MED ORDER — METOCLOPRAMIDE HCL 5 MG/ML IJ SOLN
10.0000 mg | Freq: Once | INTRAMUSCULAR | Status: AC
  Administered 2019-10-24: 10 mg via INTRAVENOUS
  Filled 2019-10-24: qty 2

## 2019-10-24 MED ORDER — DEXAMETHASONE SODIUM PHOSPHATE 10 MG/ML IJ SOLN
10.0000 mg | Freq: Once | INTRAMUSCULAR | Status: AC
  Administered 2019-10-24: 10 mg via INTRAVENOUS
  Filled 2019-10-24: qty 1

## 2019-10-24 NOTE — MAU Note (Signed)
I have had h/a and lower abd pain since 1400. I took 2 extra st Tylenol at 1500. It did not help. Denies VB but some yellow d/c with odor.

## 2019-10-25 DIAGNOSIS — Z3A11 11 weeks gestation of pregnancy: Secondary | ICD-10-CM

## 2019-10-25 DIAGNOSIS — K5900 Constipation, unspecified: Secondary | ICD-10-CM

## 2019-10-25 DIAGNOSIS — O99351 Diseases of the nervous system complicating pregnancy, first trimester: Secondary | ICD-10-CM

## 2019-10-25 DIAGNOSIS — R1032 Left lower quadrant pain: Secondary | ICD-10-CM

## 2019-10-25 DIAGNOSIS — G43909 Migraine, unspecified, not intractable, without status migrainosus: Secondary | ICD-10-CM

## 2019-10-25 DIAGNOSIS — N760 Acute vaginitis: Secondary | ICD-10-CM

## 2019-10-25 DIAGNOSIS — O99611 Diseases of the digestive system complicating pregnancy, first trimester: Secondary | ICD-10-CM

## 2019-10-25 DIAGNOSIS — O219 Vomiting of pregnancy, unspecified: Secondary | ICD-10-CM

## 2019-10-25 DIAGNOSIS — O26891 Other specified pregnancy related conditions, first trimester: Secondary | ICD-10-CM

## 2019-10-25 DIAGNOSIS — B9689 Other specified bacterial agents as the cause of diseases classified elsewhere: Secondary | ICD-10-CM

## 2019-10-25 LAB — CBC WITH DIFFERENTIAL/PLATELET
Abs Immature Granulocytes: 0.03 10*3/uL (ref 0.00–0.07)
Basophils Absolute: 0 10*3/uL (ref 0.0–0.1)
Basophils Relative: 0 %
Eosinophils Absolute: 0 10*3/uL (ref 0.0–0.5)
Eosinophils Relative: 0 %
HCT: 36 % (ref 36.0–46.0)
Hemoglobin: 12.2 g/dL (ref 12.0–15.0)
Immature Granulocytes: 0 %
Lymphocytes Relative: 26 %
Lymphs Abs: 2.4 10*3/uL (ref 0.7–4.0)
MCH: 31.3 pg (ref 26.0–34.0)
MCHC: 33.9 g/dL (ref 30.0–36.0)
MCV: 92.3 fL (ref 80.0–100.0)
Monocytes Absolute: 0.5 10*3/uL (ref 0.1–1.0)
Monocytes Relative: 6 %
Neutro Abs: 6.3 10*3/uL (ref 1.7–7.7)
Neutrophils Relative %: 68 %
Platelets: 171 10*3/uL (ref 150–400)
RBC: 3.9 MIL/uL (ref 3.87–5.11)
RDW: 11.9 % (ref 11.5–15.5)
WBC: 9.3 10*3/uL (ref 4.0–10.5)
nRBC: 0 % (ref 0.0–0.2)

## 2019-10-25 LAB — CULTURE, OB URINE: Culture: NO GROWTH

## 2019-10-25 LAB — WET PREP, GENITAL
Sperm: NONE SEEN
Trich, Wet Prep: NONE SEEN
Yeast Wet Prep HPF POC: NONE SEEN

## 2019-10-25 MED ORDER — METRONIDAZOLE 500 MG PO TABS: 500.0000 mg | ORAL_TABLET | Freq: Two times a day (BID) | ORAL | 0 refills | Status: AC

## 2019-10-25 MED ORDER — METOCLOPRAMIDE HCL 10 MG PO TABS: 10.0000 mg | ORAL_TABLET | Freq: Three times a day (TID) | ORAL | 0 refills | Status: DC | PRN

## 2019-10-25 NOTE — Discharge Instructions (Signed)
Estreimiento en los adultos Constipation, Adult Se llama estreimiento cuando:  Tiene deposiciones (defeca) una menor cantidad de veces a la semana de lo normal.  Tiene dificultad para defecar.  Las heces son secas y duras o son ms grandes que lo normal. Siga estas indicaciones en su casa: Comida y bebida   Consuma alimentos con alto contenido de [place], por ejemplo: ? Nils Pyle y verduras frescas. ? Cereales integrales. ? Frijoles.  Consuma una menor cantidad de alimentos ricos en grasas, con bajo contenido de [place] o excesivamente procesados, como: ? Papas fritas. ? Hamburguesas. ? Galletas. ? Caramelos. ? Gaseosas.  Beba suficiente lquido para mantener el pis (orina) claro o de color amarillo plido. Instrucciones generales  Haga actividad fsica con regularidad o segn las indicaciones del mdico.  Vaya al bao cuando sienta la necesidad de defecar. No se aguante las ganas.  Tome los medicamentos de venta libre y los recetados solamente como se lo haya indicado el mdico. Estos incluyen los suplementos de [place].  Realice ejercicios de reentrenamiento del suelo plvico, como: ? Respirar profundamente mientras relaja la parte inferior del vientre (abdomen). ? Relajar el suelo plvico mientras defeca.  Controle su afeccin para ver si hay cambios.  Concurra a todas las visitas de control como se lo haya indicado el mdico. Esto es importante. Comunquese con un mdico si:  Siente un dolor que empeora.  Tiene fiebre.  No ha defecado por 4das.  Vomita.  No tiene hambre.  Pierde peso.  Tiene una hemorragia en el ano.  Las deposiciones Charity fundraiser) son delgadas como un lpiz. Solicite ayuda de inmediato si:  Lance Muss, y los sntomas empeoran de repente.  Tiene prdida de materia fecal u observa [employer].  Siente el vientre ms duro o ms grande de lo normal (est hinchado).  Siente un dolor muy intenso en el vientre.  Se siente mareado o se  desmaya. Esta informacin no tiene Theme park manager el consejo del mdico. Asegrese de hacerle al mdico cualquier pregunta que tenga. Document Revised: 08/11/2016 Document Reviewed: 10/29/2015 Elsevier Patient Education  2020 Elsevier Inc.    Vaginosis bacteriana Bacterial Vaginosis  La vaginosis bacteriana es una infeccin de la vagina. Se produce cuando crece una cantidad excesiva de grmenes normales (bacterias sanas) en la vagina. Esta infeccin aumenta el riesgo de contraer otras infecciones de transmisin sexual (ITS). El tratamiento de esta infeccin puede disminuir el riesgo de Marine scientist algunas ITS. Si est embarazada, tambin debe tratarse. Esto puede causar el nacimiento prematuro de su beb. Sigue estas indicaciones en tu casa: Medicamentos  [employer] de venta libre y los recetados solamente como se lo haya indicado el mdico.  [place] o use el antibitico como se lo haya indicado el mdico. No deje de tomarlo o usarlo aunque comience a sentirse mejor. Indicaciones generales  Si su pareja sexual es mujer, dgale que tiene esta infeccin. Si tiene sntomas, debe tratarse. Si tiene una pareja sexual hombre, l no necesita tratamiento.  Durante el tratamiento: ? Haematologist. ? No se haga duchas vaginales. ? Evite la ingesta de alcohol como se lo hayan indicado. ? Evite la lactancia como se lo hayan indicado.  Beba suficiente lquido como para mantener el pis (orina) claro o de color amarillo plido.  Mantenga su vagina y ano (recto) limpios. ? Lave la zona diariamente con agua tibia. ? Cuando vaya al bao, higiencese de adelante hacia atrs.  Concurra a todas las visitas de [facility] se lo [place]  indicado el mdico. Esto es importante. Para evitar esta afeccin  No se haga duchas vaginales.  Use nicamente agua tibia para lavar la zona alrededor de la vagina.  Use proteccin al [employer]. Esto puede comprender lo  siguiente: ? Preservativos de ltex. ? Barrera bucal.  Limite la cantidad de personas con las que tiene sexo. Es Psychologist, prison and probation services relaciones sexuales con la misma persona (ser mongamo).  Hgase exmenes de ITS. Su pareja deber realizarse anlisis de deteccin.  Use ropa interior de algodn o cuya parte de adentro sea de algodn.  Evite los pantalones ajustados y las medias tipo pantis. Esto es particularmente importante durante el verano.  No consuma ningn producto que contenga nicotina o tabaco. Estos incluyen los cigarrillos y los cigarrillos electrnicos. Si necesita ayuda para dejar de fumar, consulte al mdico.  No consuma drogas ilegales.  Limite la cantidad de alcohol que consume. Comunquese con un mdico si:  Los sntomas no mejoran, incluso despus de Medical illustrator.  Tiene ms secrecin o siente dolor al hacer pis (orinar).  Grant Ruts.  Siente dolor en el vientre (abdomen).  Siente dolor durante el sexo.  Le sangra la vagina entre perodos menstruales. Resumen  Esta infeccin se produce cuando una cantidad excesiva de grmenes (bacterias) crece en la vagina.  El tratamiento de esta afeccin puede disminuir el riesgo de padecer algunas infecciones de transmisin sexual (ITS).  Si est embarazada, tambin debe tratarse. Puede causar nacimientos prematuros.  No deje de tomar ni de [employer] antibiticos aunque comience a Actor. Esta informacin no tiene Theme park manager el consejo del mdico. Asegrese de hacerle al mdico cualquier pregunta que tenga. Document Revised: 01/09/2018 Document Reviewed: 01/09/2018 Elsevier Patient Education  2020 Elsevier Inc. Polyethylene Glycol powder Qu es este medicamento? El polvo de POLIETILENGLICOL 3350 en un laxante que se [place] para tratar el estreimiento. Este medicamento aumenta la cantidad de [place]. Esto hace que las evacuaciones intestinales se produzcan con mayor facilidad y  frecuencia. Este medicamento puede ser utilizado para otros usos; si tiene alguna pregunta consulte con su proveedor de atencin mdica o con su farmacutico. MARCAS COMUNES: GaviLax, GIALAX, GlycoLax, Healthylax, MiraLax, Smooth LAX, Vita Health Qu le debo informar a mi profesional de la salud antes de tomar este medicamento? Necesita saber si usted presenta alguno de los siguientes problemas o situaciones:  antecedentes de bloqueo estomacal o intestinal  distensin o dolor abdominal actual  dificultad para tragar  diverticulitis, colitis ulcerosa u otra enfermedad intestinal crnica  fenilcetonuria  una reaccin alrgica o inusual al polietilenglicol, a otros medicamentos, colorantes o conservantes  si est embarazada o buscando quedar embarazada  si est amamantando a un beb Cmo debo utilizar este medicamento? Tome este medicamento por va oral. El frasco tiene una tapa medidora que est marcada con una lnea. Vierta el polvo en la tapa hasta la lnea marcada (la dosis es aproximadamente 1 cucharada colmada). Agregue el polvo de la tapa a un vaso lleno (4 a 8 onzas o 120 a 240 ml) de agua, jugo, gaseosa, caf o t. Mezcle bien el polvo. Asegrese de que el polvo est completamente disuelto. No beba si hay grumos. Beba la solucin. Use exactamente como se le indique. No use su medicamento con una frecuencia mayor a la indicada. Hable con su pediatra para informarse acerca del uso de este medicamento en nios. Puede requerir atencin especial. Sobredosis: Pngase en contacto inmediatamente con un centro toxicolgico o una sala de urgencia si usted  cree que haya tomado demasiado medicamento. ATENCIN: [employer]ra [employer] es solo para usted. No comparta este medicamento con nadie. Qu sucede si me olvido de una dosis? Si olvida una dosis, tmela lo antes posible. Si es casi la hora de la prxima dosis, tome slo esa dosis. No tome dosis adicionales o dobles. Qu puede interactuar con  este medicamento? No se esperan interacciones. Puede ser que esta lista no menciona todas las posibles interacciones. Informe a su profesional de KB Home	[place] de [employer] Corporation productos a base de hierbas, medicamentos de [place] o suplementos nutritivos que est tomando. Si usted fuma, consume bebidas alcohlicas o si utiliza drogas ilegales, indqueselo tambin a su profesional de KB Home	[place]. Algunas sustancias pueden interactuar con su medicamento. A qu debo estar atento al usar Coca[employer]Cola? No lo utilice durante ms de 2 semanas sin consultar a su mdico o a su profesional de KB Home	[place]. Puede ser necesario que transcurran de 2 a 4 das hasta que se produzca una evacuacin intestinal y se observe una mejora en el estreimiento. Consulte a su profesional de la salud si observa cambios en sus hbitos intestinales, incluyendo el estreimiento, que sean severos o que duren ms de 3 semanas. Tome siempre este medicamento con agua en abundancia. Qu efectos secundarios puedo tener al Mas[employer] Corporation este medicamento? Efectos secundarios que debe informar a su mdico o a Barrister's clerk de la salud tan pronto como sea posible:  diarrea  dificultad al respirar  picazn de la piel, urticarias o erupcin cutnea  hinchazn, dolor o distensin de estmago severo  vmito Efectos secundarios que, por lo general, no requieren atencin mdica (debe informarlos a su mdico o a su profesional de la salud si persisten o si son molestos):  sensacin de llenura o gases  molestias o calambres en la parte baja del abdomen  nuseas Puede ser que esta lista no menciona todos los posibles efectos secundarios. Comunquese a su mdico por asesoramiento mdico Humana Inc. Usted puede informar los efectos secundarios a la FDA por telfono al 1-800-FDA-1088. Dnde debo guardar mi medicina? Mantngala fuera del alcance de los nios. [place] a una temperatura de entre 15 y 75 grados C (48 y 28 grados  F). Deseche todo el medicamento que no haya utilizado, despus de la fecha de vencimiento. ATENCIN: Este folleto es un resumen. Puede ser que no cubra toda la posible informacin. Si usted tiene preguntas acerca de esta medicina, consulte con su mdico, su farmacutico o su profesional de Technical sales engineer.  2020 Elsevier/Gold Standard (2017-12-20 00:00:00)    Nuseas matinales Morning Sickness  Se denominan nuseas matinales a las ganas de vomitar (nuseas) durante el Media planner. Comienza a sentir nuseas y devuelve (vomita). Puede sentir nuseas por la maana, pero tambin en cualquier momento del da. Algunas mujeres experimentan nuseas intensas y no pueden detener los vmitos (hiperemesis [place]). Siga estas indicaciones en su casa: Medicamentos  [employer] de venta libre y los recetados solamente como se lo haya indicado el mdico. No tome ningn medicamento hasta no hablar primero con el mdico al [employer] Holdings Corporation.  Tomar multivitaminas antes de quedar Museum/gallery conservator o disminuir el malestar de las nuseas matinales. Comida y bebida  Coma un trozo de [place] seca o galletas antes de levantarse de la cama.  Coma 5 o 6 comidas pequeas por da.  Consuma alimentos blandos y 55 como arroz y patatas asadas.  No coma alimentos fritos, grasos o muy condimentados.  Pdale a alguna persona que cocine para  usted si [facility] de las National [employer] da nuseas o ganas de Biochemist, clinical.  Si tiene ganas de vomitar despus de tomar las vitaminas prenatales, tmelas a la noche o con una colacin.  Consuma protenas cuando necesite un refrigerio. Los frutos secos, el yogur y el queso son buenas opciones.  Tome lquidos durante todo Medical laboratory scientific officer.  Pruebe ginger ale preparada con jengibre verdadero, t de jengibre con jengibre fresco en polvo o caramelos de jengibre. Instrucciones generales  No consuma ningn producto que contenga nicotina o tabaco, como cigarrillos y Administrator, Civil Service. Si  necesita ayuda para dejar de fumar, consulte al [employer] Express.  Use un purificador de aire para mantener el aire de la casa sin olores.  Trate de respirar Engineer, site.  Intente evitar los olores que la hacen sentir descompuesta.  Intente lo siguiente: ? Use un brazalete para los mareos (pulsera de acupresin). ? Vaya a un especialista en colocar agujas finas en determinados puntos del cuerpo (acupuntura) para que usted se sienta mejor. Comunquese con un mdico si:  Necesita medicamentos para sentirse mejor.  Se siente mareada o que va a desvanecerse.  Pierde peso. Solicite ayuda de inmediato si:  Tiene Programme researcher, broadcasting/film/video y no puede dejar de Biochemist, clinical.  Pierde el conocimiento (se desmaya).  Tiene dolor muy intenso en el abdomen. Resumen  Se denominan nuseas matinales a las ganas de vomitar (nuseas) durante el Psychiatrist.  Puede sentir nuseas por la maana, pero tambin en cualquier momento del da.  Hacer algunos cambios en la alimentacin puede ayudar que los sntomas desaparezcan. Esta informacin no tiene Theme park manager el consejo del mdico. Asegrese de hacerle al mdico cualquier pregunta que tenga. Document Revised: 02/07/2017 Document Reviewed: 02/07/2017 Elsevier Patient Education  2020 [employer].

## 2019-10-25 NOTE — MAU Provider Note (Addendum)
S [name] is a 24 y.o. [phone] female @[redacted]w[redacted]d  who presents to MAU today with complaint of left sided abdominal/back pain and a headache.   LLQ Abdominal Pain  Patient reports left sided abdominal pain that started today and has not had this same pain prior to today. She denies vaginal bleeding, but has had vaginal discharge that appears yellow, denies dysuria, endorses increased urinary frequency, denies fevers but does report some chills, reports some nausea and emesis x6 between today and yesterday. Patient reports emesis is likely due to morning sickness. Abdominal pain is rated as 8/10, tried tylenol with no relief. Sometimes has back pain as well. Patient reports being constipated recently, denies hematochezia or abnormal color or feces. Reports last bowel movement was on 10/23/19.   Headache  Headache located in her forehead that started earlier today at 3pm and has persisted since, rated as 6/10 in severity. Tylenol has helped some. Patient reports nausea and emesis that she attributes to morning sickness. She also reports seeing various colors and characterizes her pain as pulsating. She denies previous diagnosis of migraine or tension headache. Denies lacrimation or rhinorrhea. Denies any numbness or weakness in her extremities.   ROS: +abd pain +vaginal discharge +N/V +constipation +HA No fever  O BP 120/70   Pulse 76   Temp 98.6 F (37 C)   Resp 18   Ht 5\' 4"  (1.626 m)   Wt 74.4 kg   LMP 08/04/2019   BMI 28.15 kg/m    Physical Exam  Constitutional: She is oriented to person, place, and time. She appears well-developed and well-nourished.  Uncomfortable appearing   HENT:  Tearful on exam, some conjunctival injection   Eyes: Pupils are equal, round, and reactive to light. No scleral icterus.  Cardiovascular: Normal rate, regular rhythm and normal heart sounds.  Respiratory: Effort normal and breath sounds normal. No respiratory distress. She has no wheezes.   GI: Soft.  Tenderness in LLQ, minimal tenderness in RLQ, No guarding, no mass  Neurological: She is alert and oriented to person, place, and time.  Skin: Skin is warm and dry.  No CVA tenderness  FHT 161  Results for orders placed or performed during the hospital encounter of 10/24/19 (from the past 24 hour(s))  Urinalysis, Routine w reflex microscopic     Status: Abnormal   Collection Time: 10/24/19  8:56 PM  Result Value Ref Range   Color, Urine YELLOW YELLOW   APPearance HAZY (A) CLEAR   Specific Gravity, Urine 1.011 1.005 - 1.030   pH 6.0 5.0 - 8.0   Glucose, UA NEGATIVE NEGATIVE mg/dL   Hgb urine dipstick NEGATIVE NEGATIVE   Bilirubin Urine NEGATIVE NEGATIVE   Ketones, ur 80 (A) NEGATIVE mg/dL   Protein, ur NEGATIVE NEGATIVE mg/dL   Nitrite NEGATIVE NEGATIVE   Leukocytes,Ua SMALL (A) NEGATIVE   RBC / HPF 0-5 0 - 5 RBC/hpf   WBC, UA 6-10 0 - 5 WBC/hpf   Bacteria, UA NONE SEEN NONE SEEN   Squamous Epithelial / LPF 6-10 0 - 5   Mucus PRESENT   Wet prep, genital     Status: Abnormal   Collection Time: 10/24/19 11:16 PM   Specimen: Vaginal  Result Value Ref Range   Yeast Wet Prep HPF POC NONE SEEN NONE SEEN   Trich, Wet Prep NONE SEEN NONE SEEN   Clue Cells Wet Prep HPF POC PRESENT (A) NONE SEEN   WBC, Wet Prep HPF POC MANY (A) NONE SEEN  Sperm NONE SEEN   CBC with Differential/Platelet     Status: None   Collection Time: 10/24/19 11:18 PM  Result Value Ref Range   WBC 9.3 4.0 - 10.5 K/uL   RBC 3.90 3.87 - 5.11 MIL/uL   Hemoglobin 12.2 12.0 - 15.0 g/dL   HCT 16.1 09.6 - 04.5 %   MCV 92.3 80.0 - 100.0 fL   MCH 31.3 26.0 - 34.0 pg   MCHC 33.9 30.0 - 36.0 g/dL   RDW 40.9 81.1 - 91.4 %   Platelets 171 150 - 400 K/uL   nRBC 0.0 0.0 - 0.2 %   Neutrophils Relative % 68 %   Neutro Abs 6.3 1.7 - 7.7 K/uL   Lymphocytes Relative 26 %   Lymphs Abs 2.4 0.7 - 4.0 K/uL   Monocytes Relative 6 %   Monocytes Absolute 0.5 0.1 - 1.0 K/uL   Eosinophils Relative 0 %   Eosinophils  Absolute 0.0 0.0 - 0.5 K/uL   Basophils Relative 0 %   Basophils Absolute 0.0 0.0 - 0.1 K/uL   Immature Granulocytes 0 %   Abs Immature Granulocytes 0.03 0.00 - 0.07 K/uL   MDM: Labs ordered and reviewed.   A Pregnant female uncomfortable appearing.  Medical screening exam complete  P Headache, likely migraine: IV benadryl and decadron, and reglan, 1 liter LR  Constipation & LLQ Pain: recommended miralax to pick up OTC to help with constipation,  -recently negative GC/Chlamydia - will repeat wet prep today (+clue cells)  - urine culture - CBC WNL   Bacterial Vaginosis: Prescribed Flagyl 500mg  BID for 7 days   Morning Sickness: Patient reports no relief with prior Zofran or phenergan, prescribed Reglan   Discharge from MAU in stable condition Patient given the option of transfer to [facility] for further evaluation or seek care in outpatient facility of choice List of options for follow-up given  Warning signs for worsening condition that would warrant emergency follow-up discussed Patient may return to MAU as needed for pregnancy related complaints   [facility], MD  I confirm that I have verified the information documented in the Resident's note and that I have also personally reperformed the history, physical exam and all medical decision making activities of this service and have verified that all service and findings are accurately documented in this student's note.   MDM: no evidence of acute process, Sx improved. Viability confirmed.  Nicki Guadalajara, CNM 10/25/2019 1:44 AM

## 2019-10-25 NOTE — Progress Notes (Deleted)
S [name] is a 24 y.o. [phone] female who presents to MAU today with complaint of left sided abdominal/back pain and a headache.   LLQ Abdominal Pain  Patient reports left sided abdominal pain that started today and has not had this same pain prior to today. She denies vaginal bleeding, but has had vaginal discharge that appears yellow, denies dysuria, endorses increased urinary frequency, denies fevers but does report some chills, reports some nausea and emesis x6 between today and yesterday. Patient reports emesis is likely due to morning sickness. Abdominal pain is rated as 8/10, tried tylenol with no relief. Sometimes has back pain as well. Patient reports being constipated recently, denies hematochezia or abnormal color or feces. Reports last bowel movement was on 10/23/19.   Headache  Headache located in her forehead that started earlier today at 3pm and has persisted since, rated as 6/10 in severity. Tylenol has helped some. Patient reports nausea and emesis that she attributes to morning sickness. She also reports seeing various colors and characterizes her pain as pulsating. She denies previous diagnosis of migraine or tension headache. Denies lacrimation or rhinorrhea. Denies any numbness or weakness in her extremities.   O BP 120/70   Pulse 76   Temp 98.6 F (37 C)   Resp 18   Ht 5\' 4"  (1.626 m)   Wt 74.4 kg   LMP 08/04/2019   BMI 28.15 kg/m    Physical Exam  Constitutional: She is oriented to person, place, and time. She appears well-developed and well-nourished.  Uncomfortable appearing   HENT:  Tearful on exam, some conjunctival injection   Eyes: Pupils are equal, round, and reactive to light. No scleral icterus.  Cardiovascular: Normal rate, regular rhythm and normal heart sounds.  Respiratory: Effort normal and breath sounds normal. No respiratory distress. She has no wheezes.  GI: Soft.  Tenderness in LLQ, minimal tenderness in RLQ   Neurological: She  is alert and oriented to person, place, and time.  Skin: Skin is warm and dry.  No CVA tenderness   A Pregnant female uncomfortable appearing.  Medical screening exam complete  Results for orders placed or performed during the hospital encounter of 10/24/19 (from the past 24 hour(s))  Urinalysis, Routine w reflex microscopic     Status: Abnormal   Collection Time: 10/24/19  8:56 PM  Result Value Ref Range   Color, Urine YELLOW YELLOW   APPearance HAZY (A) CLEAR   Specific Gravity, Urine 1.011 1.005 - 1.030   pH 6.0 5.0 - 8.0   Glucose, UA NEGATIVE NEGATIVE mg/dL   Hgb urine dipstick NEGATIVE NEGATIVE   Bilirubin Urine NEGATIVE NEGATIVE   Ketones, ur 80 (A) NEGATIVE mg/dL   Protein, ur NEGATIVE NEGATIVE mg/dL   Nitrite NEGATIVE NEGATIVE   Leukocytes,Ua SMALL (A) NEGATIVE   RBC / HPF 0-5 0 - 5 RBC/hpf   WBC, UA 6-10 0 - 5 WBC/hpf   Bacteria, UA NONE SEEN NONE SEEN   Squamous Epithelial / LPF 6-10 0 - 5   Mucus PRESENT   Wet prep, genital     Status: Abnormal   Collection Time: 10/24/19 11:16 PM   Specimen: Vaginal  Result Value Ref Range   Yeast Wet Prep HPF POC NONE SEEN NONE SEEN   Trich, Wet Prep NONE SEEN NONE SEEN   Clue Cells Wet Prep HPF POC PRESENT (A) NONE SEEN   WBC, Wet Prep HPF POC MANY (A) NONE SEEN   Sperm NONE SEEN   CBC  with Differential/Platelet     Status: None   Collection Time: 10/24/19 11:18 PM  Result Value Ref Range   WBC 9.3 4.0 - 10.5 K/uL   RBC 3.90 3.87 - 5.11 MIL/uL   Hemoglobin 12.2 12.0 - 15.0 g/dL   HCT 36.0 36.0 - 46.0 %   MCV 92.3 80.0 - 100.0 fL   MCH 31.3 26.0 - 34.0 pg   MCHC 33.9 30.0 - 36.0 g/dL   RDW 11.9 11.5 - 15.5 %   Platelets 171 150 - 400 K/uL   nRBC 0.0 0.0 - 0.2 %   Neutrophils Relative % 68 %   Neutro Abs 6.3 1.7 - 7.7 K/uL   Lymphocytes Relative 26 %   Lymphs Abs 2.4 0.7 - 4.0 K/uL   Monocytes Relative 6 %   Monocytes Absolute 0.5 0.1 - 1.0 K/uL   Eosinophils Relative 0 %   Eosinophils Absolute 0.0 0.0 - 0.5 K/uL    Basophils Relative 0 %   Basophils Absolute 0.0 0.0 - 0.1 K/uL   Immature Granulocytes 0 %   Abs Immature Granulocytes 0.03 0.00 - 0.07 K/uL   P Headache, likely migraine: IV benadryl and decadron, and reglan, 1 liter LR  Constipation & LLQ Pain: recommended miralax to pick up OTC to help with constipation,  -recently negative GC/Chlamydia - will repeat wet prep today (+clue cells)  - urine culture - CBC WNL   Bacterial Vaginosis: Prescribed Flagyl 500mg  BID for 7 days   Morning Sickness: Patient reports no relief with prior Zofran or phenergan, prescribed Reglan   Discharge from MAU in stable condition Patient given the option of transfer to [facility] for further evaluation or seek care in outpatient facility of choice List of options for follow-up given  Warning signs for worsening condition that would warrant emergency follow-up discussed Patient may return to MAU as needed for pregnancy related complaints  Stark Klein, MD 10/25/2019 1:18 AM

## 2019-10-28 ENCOUNTER — Other Ambulatory Visit: Payer: Self-pay

## 2019-10-28 ENCOUNTER — Encounter (HOSPITAL_COMMUNITY): Payer: Self-pay | Admitting: Obstetrics and Gynecology

## 2019-10-28 ENCOUNTER — Inpatient Hospital Stay (HOSPITAL_COMMUNITY)
Admission: AD | Admit: 2019-10-28 | Discharge: 2019-10-28 | Disposition: A | Discharge status: Home / Self Care | Payer: Self-pay | Attending: Obstetrics and Gynecology | Admitting: Obstetrics and Gynecology

## 2019-10-28 DIAGNOSIS — O26891 Other specified pregnancy related conditions, first trimester: Secondary | ICD-10-CM | POA: Insufficient documentation

## 2019-10-28 DIAGNOSIS — R1032 Left lower quadrant pain: Secondary | ICD-10-CM | POA: Insufficient documentation

## 2019-10-28 DIAGNOSIS — O99611 Diseases of the digestive system complicating pregnancy, first trimester: Secondary | ICD-10-CM

## 2019-10-28 DIAGNOSIS — Z3A12 12 weeks gestation of pregnancy: Secondary | ICD-10-CM

## 2019-10-28 DIAGNOSIS — K5900 Constipation, unspecified: Secondary | ICD-10-CM

## 2019-10-28 DIAGNOSIS — Z87891 Personal history of nicotine dependence: Secondary | ICD-10-CM | POA: Insufficient documentation

## 2019-10-28 LAB — URINALYSIS, ROUTINE W REFLEX MICROSCOPIC
Bacteria, UA: NONE SEEN
Bilirubin Urine: NEGATIVE
Glucose, UA: NEGATIVE mg/dL
Hgb urine dipstick: NEGATIVE
Ketones, ur: 5 mg/dL — AB
Nitrite: NEGATIVE
Protein, ur: NEGATIVE mg/dL
Specific Gravity, Urine: 1.009 (ref 1.005–1.030)
pH: 8 (ref 5.0–8.0)

## 2019-10-28 LAB — CBC
HCT: 36.6 % (ref 36.0–46.0)
Hemoglobin: 12.5 g/dL (ref 12.0–15.0)
MCH: 31.3 pg (ref 26.0–34.0)
MCHC: 34.2 g/dL (ref 30.0–36.0)
MCV: 91.5 fL (ref 80.0–100.0)
Platelets: 195 10*3/uL (ref 150–400)
RBC: 4 MIL/uL (ref 3.87–5.11)
RDW: 11.9 % (ref 11.5–15.5)
WBC: 12.2 10*3/uL — ABNORMAL HIGH (ref 4.0–10.5)
nRBC: 0 % (ref 0.0–0.2)

## 2019-10-28 MED ORDER — POLYETHYLENE GLYCOL 3350 17 GM/SCOOP PO POWD
17.0000 g | Freq: Every day | ORAL | 0 refills | Status: DC
Start: 2019-10-28 — End: 2019-11-29

## 2019-10-28 MED ORDER — DOCUSATE SODIUM 100 MG PO CAPS
100.0000 mg | ORAL_CAPSULE | Freq: Two times a day (BID) | ORAL | 2 refills | Status: DC | PRN
Start: 2019-10-28 — End: 2019-11-29

## 2019-10-28 MED ORDER — FLEET ENEMA 7-19 GM/118ML RE ENEM
1.0000 | ENEMA | Freq: Once | RECTAL | Status: AC
  Administered 2019-10-28: 1 via RECTAL

## 2019-10-28 NOTE — MAU Provider Note (Signed)
Chief Complaint:  Abdominal Pain   First Provider Initiated Contact with Patient 10/28/19 1822      HPI: Paula Gamble is a 24 y.o. [id number] at [redacted]w[redacted]d by LMP who presents to maternity admissions reporting LLQ pain x 4 hours.  She reports last BM the day before yesterday that was hard in consistency. She is taking Zofran for nausea/vomiting which has been improved.   Location: LLQ abdomen Quality: sharp, cramping Severity: 9/10 on pain scale Duration: 4 hours Timing: constant Modifying factors: none Associated signs and symptoms: constipation  HPI  Past Medical History: Past Medical History:  Diagnosis Date  . Pyelonephritis     Past obstetric history: OB History  Gravida Para Term Preterm AB Living  4 1 1   2 1   SAB TAB Ectopic Multiple Live Births  2     0 1    # Outcome Date GA Lbr Len/2nd Weight Sex Delivery Anes PTL Lv  4 Current           3 Term 11/25/16 [redacted]w[redacted]d  3901 g M CS-LTranv EPI  LIV  2 SAB           1 SAB             Past Surgical History: Past Surgical History:  Procedure Laterality Date  . ABDOMINOPLASTY    . CESAREAN SECTION N/A 11/25/2016   Procedure: CESAREAN SECTION;  Surgeon: 01/26/2017, MD;  Location: [facility] BIRTHING SUITES;  Service: Obstetrics;  Laterality: N/A;  . DILATION AND CURETTAGE OF UTERUS     x2    Family History: Family History  Problem Relation Age of Onset  . Diabetes Paternal Uncle   . Cancer Maternal Grandmother   . Diabetes Maternal Grandmother   . Hypertension Maternal Grandmother   . Cancer Paternal Grandmother        liver    Social History: Social History   Tobacco Use  . Smoking status: Former [facility]  . Smokeless tobacco: Never Used  Substance Use Topics  . Alcohol use: No    Comment: Last drank in Oct/2017, per pt drank 20 beers   . Drug use: No    Allergies:  Allergies  Allergen Reactions  . Latex Itching    Meds:  Medications Prior to Admission  Medication Sig Dispense Refill Last Dose  .  acetaminophen (TYLENOL) 500 MG tablet Take 1,000 mg by mouth every 6 (six) hours as needed.     . metoCLOPramide (REGLAN) 10 MG tablet Take 1 tablet (10 mg total) by mouth every 8 (eight) hours as needed for nausea or vomiting. 30 tablet 0   . metroNIDAZOLE (FLAGYL) 500 MG tablet Take 1 tablet (500 mg total) by mouth 2 (two) times daily for 7 days. 14 tablet 0   . ondansetron (ZOFRAN ODT) 4 MG disintegrating tablet Take 1 tablet (4 mg total) by mouth every 6 (six) hours as needed for nausea. 20 tablet 0   . Prenatal Vit-Fe Fumarate-FA (MULTIVITAMIN-PRENATAL) 27-0.8 MG TABS tablet Take 1 tablet by mouth daily at 12 noon.     . promethazine (PHENERGAN) 12.5 MG tablet Take 1 tablet (12.5 mg total) by mouth every 6 (six) hours as needed for nausea or vomiting. 30 tablet 1     ROS:  Review of Systems  Constitutional: Negative for chills, fatigue and fever.  Respiratory: Negative for shortness of breath.   Cardiovascular: Negative for chest pain.  Gastrointestinal: Positive for abdominal pain, constipation and nausea. Negative for vomiting.  Genitourinary: Positive for pelvic pain. Negative for difficulty urinating, dysuria, flank pain, vaginal bleeding, vaginal discharge and vaginal pain.  Neurological: Negative for dizziness and headaches.  Psychiatric/Behavioral: Negative.      I have reviewed patient's Past Medical Hx, Surgical Hx, Family Hx, Social Hx, medications and allergies.   Physical Exam   Patient Vitals for the past 24 hrs:  BP Temp Temp src Pulse Resp SpO2 Height Weight  10/28/19 1633 102/67 98.6 F (37 C) Oral 99 16 100 % -- --  10/28/19 1627 -- -- -- -- -- -- 5\' 4"  (1.626 m) 74.2 kg   Constitutional: Well-developed, well-nourished female in no acute distress.  Cardiovascular: normal rate Respiratory: normal effort GI: Abd soft, mild tenderness in LLQ only, gravid appropriate for gestational age.  MS: Extremities nontender, no edema, normal ROM Neurologic: Alert and  oriented x 4.  GU: Neg CVAT.  PELVIC EXAM: Deferred    FHT:  Unable to obtain by doppler, FHR 150s on bedside  Labs: Results for orders placed or performed during the hospital encounter of 10/28/19 (from the past 24 hour(s))  Urinalysis, Routine w reflex microscopic     Status: Abnormal   Collection Time: 10/28/19  5:00 PM  Result Value Ref Range   Color, Urine STRAW (A) YELLOW   APPearance CLEAR CLEAR   Specific Gravity, Urine 1.009 1.005 - 1.030   pH 8.0 5.0 - 8.0   Glucose, UA NEGATIVE NEGATIVE mg/dL   Hgb urine dipstick NEGATIVE NEGATIVE   Bilirubin Urine NEGATIVE NEGATIVE   Ketones, ur 5 (A) NEGATIVE mg/dL   Protein, ur NEGATIVE NEGATIVE mg/dL   Nitrite NEGATIVE NEGATIVE   Leukocytes,Ua SMALL (A) NEGATIVE   RBC / HPF 0-5 0 - 5 RBC/hpf   WBC, UA 0-5 0 - 5 WBC/hpf   Bacteria, UA NONE SEEN NONE SEEN   Squamous Epithelial / LPF 0-5 0 - 5   Mucus PRESENT   CBC     Status: Abnormal   Collection Time: 10/28/19  6:35 PM  Result Value Ref Range   WBC 12.2 (H) 4.0 - 10.5 K/uL   RBC 4.00 3.87 - 5.11 MIL/uL   Hemoglobin 12.5 12.0 - 15.0 g/dL   HCT 12/28/19 59.5 - 63.8 %   MCV 91.5 80.0 - 100.0 fL   MCH 31.3 26.0 - 34.0 pg   MCHC 34.2 30.0 - 36.0 g/dL   RDW 75.6 43.3 - 29.5 %   Platelets 195 150 - 400 K/uL   nRBC 0.0 0.0 - 0.2 %      Imaging:  No results found.   Limited OB 18.8 Date: 10/28/19 EDD : [redacted]w[redacted]d  based on LMP Viability:  FHT detected CRL measurement c/w previous dates  Pt informed that the ultrasound is considered a limited OB ultrasound and is not intended to be a complete ultrasound exam.  Patient also informed that the ultrasound is not being completed with the intent of assessing for fetal or placental anomalies or any pelvic abnormalities.  Explained that the purpose of today's ultrasound is to assess for  viability.  Patient acknowledges the purpose of the exam and the limitations of the study.      MAU Course/MDM: Orders Placed This Encounter  Procedures   . Urinalysis, Routine w reflex microscopic  . CBC  . Discharge patient    Meds ordered this encounter  Medications  . sodium phosphate (FLEET) 7-19 GM/118ML enema 1 enema  . docusate sodium (COLACE) 100 MG capsule    Sig:  Take 1 capsule (100 mg total) by mouth 2 (two) times daily as needed.    Dispense:  30 capsule    Refill:  2    Order Specific Question:   Supervising Provider    Answer:   Donnamae Jude [[id number]]  . polyethylene glycol powder (GLYCOLAX/MIRALAX) 17 GM/SCOOP powder    Sig: Take 17 g by mouth daily.    Dispense:  255 g    Refill:  0    Order Specific Question:   Supervising Provider    Answer:   Donnamae Jude [[id number]]     Bedside US reveals live IUP. Pain most c/w constipation.  Discussed options with pt using hospital spanish interpreter.  Pt would like enema in MAU and then will take Colace and Miralax at home, and use other nausea medications to decrease use of Zofran.    Pt with bowel movement and improved pain after enema given in MAU.  Rx for Colace and Miralax, pt to f/u at [facility] on 6/9 as scheduled.   Pt discharge with strict return precautions.    Assessment: 1. Constipation during pregnancy in first trimester   2. Abdominal pain during pregnancy in first trimester     Plan: Discharge home Labor precautions and fetal kick counts Follow-up [place] for [place] at [facility] Follow up.   Specialty: Obstetrics and Gynecology Why: As scheduled, return to MAU for emergencies. Contact information: [address] [place] [address] [phone]         Allergies as of 10/28/2019      Reactions   Latex Itching      Medication List    TAKE these medications   acetaminophen 500 MG tablet Commonly known as: TYLENOL Take 1,000 mg by mouth every 6 (six) hours as needed.   docusate sodium 100 MG capsule Commonly known as: COLACE Take 1 capsule (100 mg total) by mouth 2 (two) times  daily as needed.   metoCLOPramide 10 MG tablet Commonly known as: REGLAN Take 1 tablet (10 mg total) by mouth every 8 (eight) hours as needed for nausea or vomiting.   metroNIDAZOLE 500 MG tablet Commonly known as: Flagyl Take 1 tablet (500 mg total) by mouth 2 (two) times daily for 7 days.   multivitamin-prenatal 27-0.8 MG Tabs tablet Take 1 tablet by mouth daily at 12 noon.   ondansetron 4 MG disintegrating tablet Commonly known as: Zofran ODT Take 1 tablet (4 mg total) by mouth every 6 (six) hours as needed for nausea.   polyethylene glycol powder 17 GM/SCOOP powder Commonly known as: GLYCOLAX/MIRALAX Take 17 g by mouth daily.   promethazine 12.5 MG tablet Commonly known as: PHENERGAN Take 1 tablet (12.5 mg total) by mouth every 6 (six) hours as needed for nausea or vomiting.       Fatima Blank Certified Nurse-Midwife 10/28/2019 7:27 PM

## 2019-10-28 NOTE — MAU Note (Signed)
Paula Gamble is a 24 y.o. at [redacted]w[redacted]d here in MAU reporting: left lower abdominal pain that started about an hour ago. Denies bleeding or discharge. Has been taking zofran, picked up the miralax recommended and states she had a normal bowel movement 1 time. Last BM was the day before yesterday.  Onset of complaint: today within the past hour  Pain score: 9/10  Vitals:   10/28/19 1633  BP: 102/67  Pulse: 99  Resp: 16  Temp: 98.6 F (37 C)  SpO2: 100%     Lab orders placed from triage: UA

## 2019-10-31 ENCOUNTER — Ambulatory Visit (INDEPENDENT_AMBULATORY_CARE_PROVIDER_SITE_OTHER): Payer: Self-pay | Admitting: *Deleted

## 2019-10-31 ENCOUNTER — Other Ambulatory Visit: Payer: Self-pay

## 2019-10-31 DIAGNOSIS — Z8616 Personal history of COVID-19: Secondary | ICD-10-CM

## 2019-10-31 DIAGNOSIS — O99210 Obesity complicating pregnancy, unspecified trimester: Secondary | ICD-10-CM | POA: Insufficient documentation

## 2019-10-31 DIAGNOSIS — Z3490 Encounter for supervision of normal pregnancy, unspecified, unspecified trimester: Secondary | ICD-10-CM | POA: Insufficient documentation

## 2019-10-31 DIAGNOSIS — Z8759 Personal history of other complications of pregnancy, childbirth and the puerperium: Secondary | ICD-10-CM | POA: Insufficient documentation

## 2019-10-31 DIAGNOSIS — Z789 Other specified health status: Secondary | ICD-10-CM

## 2019-10-31 DIAGNOSIS — Z87440 Personal history of urinary (tract) infections: Secondary | ICD-10-CM

## 2019-10-31 DIAGNOSIS — Z98891 History of uterine scar from previous surgery: Secondary | ICD-10-CM | POA: Insufficient documentation

## 2019-10-31 DIAGNOSIS — E669 Obesity, unspecified: Secondary | ICD-10-CM

## 2019-10-31 HISTORY — DX: Personal history of COVID-19: Z86.16

## 2019-10-31 NOTE — Progress Notes (Signed)
I connected with  Darryl Lent on 10/31/19 at  3:15 PM EDT by telephone and verified that I am speaking with the correct person using two identifiers.   I discussed the limitations, risks, security and privacy concerns of performing an evaluation and management service by telephone and the availability of in person appointments. I also discussed with the patient that there may be a patient responsible charge related to this service. The patient expressed understanding and agreed to proceed.  I explained I am completing her New OB Intake today. We discussed Her EDD and that it is based on early US done in MAU.  I reviewed her allergies, meds, OB History, Medical /Surgical history, and appropriate screenings. I informed her of [facility] services.   I explained we will have her take her blood pressure weekly during her pregnancy. She confirms she has a blood pressure cuff . I asked her to bring the blood pressure cuff with her to her first ob appointment so we can show her how to use it and make sure it is working correctly. I explained we will have her take her blood pressure weekly.  I explained she will have some visits in office and some virtually and that we will will help with app to do that.  I reviewed her new ob  appointment date/ time with her , our location and to wear mask, no visitors.  I explained she will have a pelvic exam, ob bloodwork, hemoglobin a1C, cbg ,pap, and  genetic testing if desired,- she is unsure about a panorama. I scheduled an [place] at 19 weeks She voices understanding.   Manasvi Dickard,RN 10/31/2019  3:21 PM

## 2019-11-01 ENCOUNTER — Encounter: Payer: Self-pay | Admitting: Obstetrics and Gynecology

## 2019-11-01 ENCOUNTER — Other Ambulatory Visit (HOSPITAL_COMMUNITY)
Admission: RE | Admit: 2019-11-01 | Discharge: 2019-11-01 | Disposition: A | Discharge status: Home / Self Care | Payer: Self-pay | Source: Ambulatory Visit | Attending: Obstetrics and Gynecology | Admitting: Obstetrics and Gynecology

## 2019-11-01 ENCOUNTER — Ambulatory Visit (INDEPENDENT_AMBULATORY_CARE_PROVIDER_SITE_OTHER): Payer: Self-pay | Admitting: Obstetrics and Gynecology

## 2019-11-01 VITALS — BP 102/69 | HR 86 | Wt 163.7 lb

## 2019-11-01 DIAGNOSIS — Z3491 Encounter for supervision of normal pregnancy, unspecified, first trimester: Secondary | ICD-10-CM

## 2019-11-01 DIAGNOSIS — B3731 Acute candidiasis of vulva and vagina: Secondary | ICD-10-CM | POA: Insufficient documentation

## 2019-11-01 DIAGNOSIS — B373 Candidiasis of vulva and vagina: Secondary | ICD-10-CM

## 2019-11-01 DIAGNOSIS — O2341 Unspecified infection of urinary tract in pregnancy, first trimester: Secondary | ICD-10-CM

## 2019-11-01 DIAGNOSIS — O98811 Other maternal infectious and parasitic diseases complicating pregnancy, first trimester: Secondary | ICD-10-CM

## 2019-11-01 DIAGNOSIS — Z3A11 11 weeks gestation of pregnancy: Secondary | ICD-10-CM

## 2019-11-01 DIAGNOSIS — B951 Streptococcus, group B, as the cause of diseases classified elsewhere: Secondary | ICD-10-CM

## 2019-11-01 MED ORDER — TERCONAZOLE 0.4 % VA CREA
1.0000 | TOPICAL_CREAM | Freq: Every day | VAGINAL | 0 refills | Status: DC
Start: 2019-11-01 — End: 2019-11-29

## 2019-11-01 NOTE — Progress Notes (Signed)
History:   Paula Gamble is a 24 y.o. [phone] at [redacted]w[redacted]d by LMP being seen today for her first obstetrical visit.  Her obstetrical history is significant for nausea/vomiting, previous C/s d/t failure to progress.  Patient does not intend to breast feed. Pregnancy history fully reviewed.  Patient reports nausea and vomiting that is improving with Reglan. She is taking this 1x per day. She tried Zofran which did not work. She has decreased appetite.   Spanish interpretor used for entire visit.   HISTORY: OB History  Gravida Para Term Preterm AB Living  4 1 1  0 2 1  SAB TAB Ectopic Multiple Live Births  2 0 0 0 1    # Outcome Date GA Lbr Len/2nd Weight Sex Delivery Anes PTL Lv  4 Current           3 Term 11/25/16 [redacted]w[redacted]d  8 lb 9.6 oz (3.901 kg) M CS-LTranv EPI  LIV     Name: LEBRON-UBIERA,BOY Allisson     Apgar1: 8  Apgar5: 9  2 SAB 2016 [redacted]w[redacted]d         1 SAB             Last pap smear was done 2017 and was abnormal - CIN-1/HPV, LSIL. Did not have f/u pap   Past Medical History:  Diagnosis Date  . COVID-19   . Pyelonephritis    Past Surgical History:  Procedure Laterality Date  . ABDOMINOPLASTY    . CESAREAN SECTION N/A 11/25/2016   Procedure: CESAREAN SECTION;  Surgeon: 01/26/2017, MD;  Location: [facility] BIRTHING SUITES;  Service: Obstetrics;  Laterality: N/A;  . DILATION AND CURETTAGE OF UTERUS     x2   Family History  Problem Relation Age of Onset  . Diabetes Paternal Uncle   . Cancer Maternal Grandmother   . Diabetes Maternal Grandmother   . Hypertension Maternal Grandmother   . Cancer Paternal Grandmother        liver   Social History   Tobacco Use  . Smoking status: Never Smoker  . Smokeless tobacco: Never Used  Vaping Use  . Vaping Use: Former  Substance Use Topics  . Alcohol use: Not Currently    Comment: not since January  . Drug use: Not Currently    Types: Marijuana    Comment: last used before 09/19/19    Allergies  Allergen Reactions  . Latex  Itching   Current Outpatient Medications on File Prior to Visit  Medication Sig Dispense Refill  . acetaminophen (TYLENOL) 500 MG tablet Take 1,000 mg by mouth every 6 (six) hours as needed.    . docusate sodium (COLACE) 100 MG capsule Take 1 capsule (100 mg total) by mouth 2 (two) times daily as needed. 30 capsule 2  . metoCLOPramide (REGLAN) 10 MG tablet Take 1 tablet (10 mg total) by mouth every 8 (eight) hours as needed for nausea or vomiting. 30 tablet 0  . metroNIDAZOLE (FLAGYL) 500 MG tablet Take 1 tablet (500 mg total) by mouth 2 (two) times daily for 7 days. 14 tablet 0  . polyethylene glycol powder (GLYCOLAX/MIRALAX) 17 GM/SCOOP powder Take 17 g by mouth daily. 255 g 0  . Prenatal Vit-Fe Fumarate-FA (MULTIVITAMIN-PRENATAL) 27-0.8 MG TABS tablet Take 1 tablet by mouth daily at 12 noon.    . ondansetron (ZOFRAN ODT) 4 MG disintegrating tablet Take 1 tablet (4 mg total) by mouth every 6 (six) hours as needed for nausea. (Patient not taking: Reported on 11/01/2019) 20  tablet 0  . promethazine (PHENERGAN) 12.5 MG tablet Take 1 tablet (12.5 mg total) by mouth every 6 (six) hours as needed for nausea or vomiting. (Patient not taking: Reported on 11/01/2019) 30 tablet 1   No current facility-administered medications on file prior to visit.    Review of Systems Pertinent items noted in HPI and remainder of comprehensive ROS otherwise negative. Physical Exam:   Vitals:   11/01/19 1013  BP: 102/69  Pulse: 86  Weight: 163 lb 11.2 oz (74.3 kg)   BP 102/69   Pulse 86   Wt 163 lb 11.2 oz (74.3 kg)   LMP 08/04/2019   BMI 28.10 kg/m  Uterine Size: size equals dates  Pelvic Exam:    Perineum: No Hemorrhoids, Normal Perineum   Vulva: normal   Vagina:  Large amount of copious, thick/clumpy, yellow vaginal discharge.    pH: Not done   Cervix: no bleeding following Pap, no cervical motion tenderness and no lesions   Adnexa: normal adnexa and no mass, fullness, tenderness   Bony Pelvis:  Adequate  System: Breast:  No nipple retraction or dimpling, No nipple discharge or bleeding, No axillary or supraclavicular adenopathy, Normal to palpation without dominant masses   Skin: normal coloration and turgor, no rashes    Neurologic: negative   Extremities: normal strength, tone, and muscle mass   HEENT neck supple with midline trachea and thyroid without masses   Mouth/Teeth mucous membranes moist, pharynx normal without lesions   Neck supple and no masses   Cardiovascular: regular rate and rhythm, no murmurs or gallops   Respiratory:  appears well, vitals normal, no respiratory distress, acyanotic, normal RR, neck free of mass or lymphadenopathy, chest clear, no wheezing, crepitations, rhonchi, normal symmetric air entry   Abdomen: soft, non-tender; bowel sounds normal; no masses,  no organomegaly   Urinary: urethral meatus normal   Fetal Heart Rate (bpm): 161  Assessment:    Pregnancy: [id number] Patient Active Problem List   Diagnosis Date Noted  . Vaginal yeast infection 11/01/2019  . Supervision of low-risk pregnancy 10/31/2019  . Language barrier 10/31/2019  . History of COVID-19 10/31/2019  . History of pyelonephritis during pregnancy 10/31/2019  . History of C-section 10/31/2019  . Obesity during pregnancy 10/31/2019  . Abnormal Pap smear of cervix 01/06/2017    Plan:   1. Encounter for supervision of low-risk pregnancy in first trimester  - Culture, OB Urine - CBC/D/Plt+RPR+Rh+ABO+Rub Ab... - Genetic Screening - Hemoglobin A1c - Cytology - PAP( [place]) - Cervicovaginal ancillary only( ) - Reglan can be taken 4x per day. Increase small meals/snacks.   2. Vaginal yeast infection  Rx: Terazol   Initial labs drawn. Continue prenatal vitamins. Genetic Screening discussed, NIPS: requested. Ultrasound discussed; fetal anatomic survey: requested and scheduled.  Problem list reviewed and updated. The nature of [place] with multiple MDs and other Advanced Practice Providers was explained to patient; also emphasized that residents, students are part of our team. Routine obstetric precautions reviewed. No follow-ups on file.   Fairy Ashlock, Artist Pais, [place] for [employer], [place]

## 2019-11-01 NOTE — Progress Notes (Signed)
Pt states is having a yellowish d/c with itching.

## 2019-11-01 NOTE — Progress Notes (Signed)
Asked by registrar to check on patient in lobby. Patient c/o feeling lightheaded, dizzy. Patient sitting in lobby.  BP 116/61 with pulse 80. She reports she has been feeling dizzy , lightheaded x 4 weeks at home and has passed out twice. She reports she was having N&V but taking nausea med and hasn't had N&V about a week. Given water and cool cloth. Escorted patient to an exam room to lie down with another staff member who will report to provider.  Percell Lamboy,RN

## 2019-11-02 LAB — CBC/D/PLT+RPR+RH+ABO+RUB AB...
Antibody Screen: NEGATIVE
Basophils Absolute: 0 10*3/uL (ref 0.0–0.2)
Basos: 0 %
EOS (ABSOLUTE): 0 10*3/uL (ref 0.0–0.4)
Eos: 0 %
HCV Ab: <0.1 s/co ratio (ref 0.0–0.9)
HIV Screen 4th Generation wRfx: NONREACTIVE
Hematocrit: 35 % (ref 34.0–46.6)
Hemoglobin: 12 g/dL (ref 11.1–15.9)
Hepatitis B Surface Ag: NEGATIVE
Immature Grans (Abs): 0 10*3/uL (ref 0.0–0.1)
Immature Granulocytes: 0 %
Lymphocytes Absolute: 1.9 10*3/uL (ref 0.7–3.1)
Lymphs: 20 %
MCH: 31.6 pg (ref 26.6–33.0)
MCHC: 34.3 g/dL (ref 31.5–35.7)
MCV: 92 fL (ref 79–97)
Monocytes Absolute: 0.5 10*3/uL (ref 0.1–0.9)
Monocytes: 5 %
Neutrophils Absolute: 7 10*3/uL (ref 1.4–7.0)
Neutrophils: 75 %
Platelets: 182 10*3/uL (ref 150–450)
RBC: 3.8 x10E6/uL (ref 3.77–5.28)
RDW: 12.2 % (ref 11.7–15.4)
RPR Ser Ql: NONREACTIVE
Rh Factor: POSITIVE
Rubella Antibodies, IGG: 8.24 index (ref >0.99)
WBC: 9.4 10*3/uL (ref 3.4–10.8)

## 2019-11-02 LAB — CYTOLOGY - PAP
Chlamydia: NEGATIVE
Comment: NEGATIVE
Comment: NORMAL
Diagnosis: NEGATIVE
Neisseria Gonorrhea: NEGATIVE

## 2019-11-02 LAB — HEMOGLOBIN A1C
Est. average glucose Bld gHb Est-mCnc: 94 mg/dL
Hgb A1c MFr Bld: 4.9 % (ref 4.8–5.6)

## 2019-11-02 LAB — HCV INTERPRETATION

## 2019-11-05 LAB — URINE CULTURE, OB REFLEX

## 2019-11-05 LAB — CERVICOVAGINAL ANCILLARY ONLY
Bacterial Vaginitis (gardnerella): NEGATIVE
Candida Glabrata: NEGATIVE
Candida Vaginitis: POSITIVE — AB
Comment: NEGATIVE
Comment: NEGATIVE
Comment: NEGATIVE
Comment: NEGATIVE
Trichomonas: NEGATIVE

## 2019-11-05 LAB — CULTURE, OB URINE

## 2019-11-08 DIAGNOSIS — B951 Streptococcus, group B, as the cause of diseases classified elsewhere: Secondary | ICD-10-CM | POA: Insufficient documentation

## 2019-11-08 MED ORDER — CEPHALEXIN 500 MG PO CAPS: 500.0000 mg | ORAL_CAPSULE | Freq: Four times a day (QID) | ORAL | 0 refills | Status: DC

## 2019-11-08 NOTE — Addendum Note (Signed)
Addended by: Venia Carbon I on: 11/08/2019 08:32 AM   Modules accepted: Orders

## 2019-11-09 ENCOUNTER — Other Ambulatory Visit: Payer: Self-pay

## 2019-11-09 ENCOUNTER — Encounter (HOSPITAL_COMMUNITY): Payer: Self-pay | Admitting: Obstetrics & Gynecology

## 2019-11-09 ENCOUNTER — Inpatient Hospital Stay (HOSPITAL_COMMUNITY)
Admission: AD | Admit: 2019-11-09 | Discharge: 2019-11-09 | Disposition: A | Discharge status: Home / Self Care | Payer: Self-pay | Attending: Obstetrics & Gynecology | Admitting: Obstetrics & Gynecology

## 2019-11-09 DIAGNOSIS — Z79899 Other long term (current) drug therapy: Secondary | ICD-10-CM | POA: Insufficient documentation

## 2019-11-09 DIAGNOSIS — O26891 Other specified pregnancy related conditions, first trimester: Secondary | ICD-10-CM

## 2019-11-09 DIAGNOSIS — B951 Streptococcus, group B, as the cause of diseases classified elsewhere: Secondary | ICD-10-CM | POA: Insufficient documentation

## 2019-11-09 DIAGNOSIS — R109 Unspecified abdominal pain: Secondary | ICD-10-CM

## 2019-11-09 DIAGNOSIS — Z3A12 12 weeks gestation of pregnancy: Secondary | ICD-10-CM | POA: Insufficient documentation

## 2019-11-09 DIAGNOSIS — O2341 Unspecified infection of urinary tract in pregnancy, first trimester: Secondary | ICD-10-CM | POA: Insufficient documentation

## 2019-11-09 LAB — URINALYSIS, ROUTINE W REFLEX MICROSCOPIC
Bacteria, UA: NONE SEEN
Bilirubin Urine: NEGATIVE
Glucose, UA: NEGATIVE mg/dL
Hgb urine dipstick: NEGATIVE
Ketones, ur: NEGATIVE mg/dL
Leukocytes,Ua: NEGATIVE
Nitrite: NEGATIVE
Protein, ur: NEGATIVE mg/dL
Specific Gravity, Urine: 1.014 (ref 1.005–1.030)
pH: 6 (ref 5.0–8.0)

## 2019-11-09 LAB — CBC
HCT: 32.3 % — ABNORMAL LOW (ref 36.0–46.0)
Hemoglobin: 10.9 g/dL — ABNORMAL LOW (ref 12.0–15.0)
MCH: 31.1 pg (ref 26.0–34.0)
MCHC: 33.7 g/dL (ref 30.0–36.0)
MCV: 92 fL (ref 80.0–100.0)
Platelets: 169 10*3/uL (ref 150–400)
RBC: 3.51 MIL/uL — ABNORMAL LOW (ref 3.87–5.11)
RDW: 12.3 % (ref 11.5–15.5)
WBC: 8.4 10*3/uL (ref 4.0–10.5)
nRBC: 0 % (ref 0.0–0.2)

## 2019-11-09 LAB — WET PREP, GENITAL
Clue Cells Wet Prep HPF POC: NONE SEEN
Sperm: NONE SEEN
Trich, Wet Prep: NONE SEEN
WBC, Wet Prep HPF POC: NONE SEEN
Yeast Wet Prep HPF POC: NONE SEEN

## 2019-11-09 MED ORDER — DIPHENHYDRAMINE HCL 50 MG/ML IJ SOLN
25.0000 mg | Freq: Once | INTRAMUSCULAR | Status: AC
  Administered 2019-11-09: 25 mg via INTRAVENOUS
  Filled 2019-11-09: qty 1

## 2019-11-09 MED ORDER — DEXAMETHASONE SODIUM PHOSPHATE 10 MG/ML IJ SOLN
10.0000 mg | Freq: Once | INTRAMUSCULAR | Status: AC
  Administered 2019-11-09: 10 mg via INTRAVENOUS
  Filled 2019-11-09: qty 1

## 2019-11-09 MED ORDER — LACTATED RINGERS IV BOLUS (SEPSIS)
1000.0000 mL | Freq: Once | INTRAVENOUS | Status: AC
  Administered 2019-11-09: 1000 mL via INTRAVENOUS

## 2019-11-09 MED ORDER — LACTATED RINGERS IV SOLN: INTRAVENOUS | Status: DC

## 2019-11-09 MED ORDER — METOCLOPRAMIDE HCL 5 MG/ML IJ SOLN
10.0000 mg | Freq: Once | INTRAMUSCULAR | Status: AC
  Administered 2019-11-09: 10 mg via INTRAVENOUS
  Filled 2019-11-09: qty 2

## 2019-11-09 NOTE — MAU Note (Signed)
.  Paula Gamble is a 24 y.o. at [redacted]w[redacted]d here in MAU reporting: she has lower abdominal cramping with a headache for 3 days. Denies any VB or abnormal discharge LMP: 08/04/19 Onset of complaint: 3 days ago Pain score:  There were no vitals filed for this visit.   FHT: Lab orders placed from triage: UA

## 2019-11-09 NOTE — MAU Provider Note (Signed)
Chief Complaint: Abdominal Pain and Headache   First Provider Initiated Contact with Patient 11/09/19 1252      SUBJECTIVE HPI: Paula Gamble is a 24 y.o. [id number] at [redacted]w[redacted]d by LMP who presents to maternity admissions reporting headache and lower abdominal pain.  She has tried Tylenol that is not helping. She reports regular bowel movements and denies dysuria, fever/chills, or n/v.    Location: lower abdomen Quality: cramping Severity: 6/10 on pain scale Duration: 3 days Timing: intermittent Modifying factors: Tylenol has not helped Associated signs and symptoms: headache  HPI  Past Medical History:  Diagnosis Date  . COVID-19   . Pyelonephritis    Past Surgical History:  Procedure Laterality Date  . ABDOMINOPLASTY    . CESAREAN SECTION N/A 11/25/2016   Procedure: CESAREAN SECTION;  Surgeon: Lazaro Arms, MD;  Location: [facility] BIRTHING SUITES;  Service: Obstetrics;  Laterality: N/A;  . DILATION AND CURETTAGE OF UTERUS     x2   Social History   Socioeconomic History  . Marital status: Married    Spouse name: Not on file  . Number of children: Not on file  . Years of education: Not on file  . Highest education level: Not on file  Occupational History  . Not on file  Tobacco Use  . Smoking status: Never Smoker  . Smokeless tobacco: Never Used  Vaping Use  . Vaping Use: Former  Substance and Sexual Activity  . Alcohol use: Not Currently    Comment: not since January  . Drug use: Not Currently    Types: Marijuana    Comment: last used before 09/19/19   . Sexual activity: Yes    Birth control/protection: None  Other Topics Concern  . Not on file  Social History Narrative  . Not on file   Social Determinants of Health   Financial Resource Strain:   . Difficulty of Paying Living Expenses:   Food Insecurity: No Food Insecurity  . Worried About Programme researcher, broadcasting/film/video in the Last Year: Never true  . Ran Out of Food in the Last Year: Never true  Transportation  Needs: No Transportation Needs  . Lack of Transportation (Medical): No  . Lack of Transportation (Non-Medical): No  Physical Activity:   . Days of Exercise per Week:   . Minutes of Exercise per Session:   Stress:   . Feeling of Stress :   Social Connections:   . Frequency of Communication with Friends and Family:   . Frequency of Social Gatherings with Friends and Family:   . Attends Religious Services:   . Active Member of Clubs or Organizations:   . Attends Banker Meetings:   Marland Kitchen Marital Status:   Intimate Partner Violence:   . Fear of Current or Ex-Partner:   . Emotionally Abused:   Marland Kitchen Physically Abused:   . Sexually Abused:    No current facility-administered medications on file prior to encounter.   Current Outpatient Medications on File Prior to Encounter  Medication Sig Dispense Refill  . acetaminophen (TYLENOL) 500 MG tablet Take 1,000 mg by mouth every 6 (six) hours as needed.    . docusate sodium (COLACE) 100 MG capsule Take 1 capsule (100 mg total) by mouth 2 (two) times daily as needed. 30 capsule 2  . metoCLOPramide (REGLAN) 10 MG tablet Take 1 tablet (10 mg total) by mouth every 8 (eight) hours as needed for nausea or vomiting. 30 tablet 0  . ondansetron (ZOFRAN ODT) 4 MG disintegrating  tablet Take 1 tablet (4 mg total) by mouth every 6 (six) hours as needed for nausea. 20 tablet 0  . Prenatal Vit-Fe Fumarate-FA (MULTIVITAMIN-PRENATAL) 27-0.8 MG TABS tablet Take 1 tablet by mouth daily at 12 noon.    . cephALEXin (KEFLEX) 500 MG capsule Take 1 capsule (500 mg total) by mouth 4 (four) times daily. 28 capsule 0  . polyethylene glycol powder (GLYCOLAX/MIRALAX) 17 GM/SCOOP powder Take 17 g by mouth daily. 255 g 0  . promethazine (PHENERGAN) 12.5 MG tablet Take 1 tablet (12.5 mg total) by mouth every 6 (six) hours as needed for nausea or vomiting. (Patient not taking: Reported on 11/01/2019) 30 tablet 1  . terconazole (TERAZOL 7) 0.4 % vaginal cream Place 1  applicator vaginally at bedtime. 45 g 0   Allergies  Allergen Reactions  . Latex Itching    ROS:  Review of Systems  Constitutional: Negative for chills and fever.  Respiratory: Negative for cough and shortness of breath.   Cardiovascular: Negative for chest pain.  Gastrointestinal: Positive for abdominal pain. Negative for nausea and vomiting.  Genitourinary: Negative for dysuria, frequency and urgency.  Musculoskeletal: Negative.   Neurological: Positive for headaches. Negative for dizziness.     I have reviewed patient's Past Medical Hx, Surgical Hx, Family Hx, Social Hx, medications and allergies.   Physical Exam   Patient Vitals for the past 24 hrs:  Temp Resp Weight  11/09/19 1155 97.8 F (36.6 C) 16 --  11/09/19 1151 -- -- 73.5 kg   Constitutional: Well-developed, well-nourished female in no acute distress.  Cardiovascular: normal rate Respiratory: normal effort GI: Abd soft, non-tender. Pos BS x 4 MS: Extremities nontender, no edema, normal ROM Neurologic: Alert and oriented x 4.  GU: Neg CVAT.  PELVIC EXAM: Cervix pink, visually closed, without lesion, scant white creamy discharge, vaginal walls and external genitalia normal Bimanual exam: Cervix 0/long/high, firm, anterior, neg CMT, uterus nontender, nonenlarged, adnexa without tenderness, enlargement, or mass  FHT 151 by doppler  LAB RESULTS Results for orders placed or performed during the hospital encounter of 11/09/19 (from the past 24 hour(s))  CBC     Status: Abnormal   Collection Time: 11/09/19 12:32 PM  Result Value Ref Range   WBC 8.4 4.0 - 10.5 K/uL   RBC 3.51 (L) 3.87 - 5.11 MIL/uL   Hemoglobin 10.9 (L) 12.0 - 15.0 g/dL   HCT 19.6 (L) 36 - 46 %   MCV 92.0 80.0 - 100.0 fL   MCH 31.1 26.0 - 34.0 pg   MCHC 33.7 30.0 - 36.0 g/dL   RDW 22.2 97.9 - 89.2 %   Platelets 169 150 - 400 K/uL   nRBC 0.0 0.0 - 0.2 %    O/Positive/-- (06/10 1206)  IMAGING No results found.  MAU  Management/MDM: Orders Placed This Encounter  Procedures  . Wet prep, genital  . Urinalysis, Routine w reflex microscopic  . CBC    Meds ordered this encounter  Medications  . FOLLOWED BY Linked Order Group   . lactated ringers bolus 1,000 mL   . diphenhydrAMINE (BENADRYL) injection 25 mg   . metoCLOPramide (REGLAN) injection 10 mg   . dexamethasone (DECADRON) injection 10 mg    Pt headache and abdominal pain improved with IV fluids, medications.  D/C home, pt to follow up in office as scheduled. Return to MAU as needed for emergencies.    ASSESSMENT  1. Abdominal pain during pregnancy in first trimester   2. Headache in pregnancy, antepartum,  first trimester   3. Group B Streptococcus urinary tract infection affecting pregnancy in first trimester    PLAN Discharge home  Allergies as of 11/09/2019      Reactions   Latex Itching      Medication List    TAKE these medications   acetaminophen 500 MG tablet Commonly known as: TYLENOL Take 1,000 mg by mouth every 6 (six) hours as needed.   cephALEXin 500 MG capsule Commonly known as: Keflex Take 1 capsule (500 mg total) by mouth 4 (four) times daily.   docusate sodium 100 MG capsule Commonly known as: COLACE Take 1 capsule (100 mg total) by mouth 2 (two) times daily as needed.   metoCLOPramide 10 MG tablet Commonly known as: REGLAN Take 1 tablet (10 mg total) by mouth every 8 (eight) hours as needed for nausea or vomiting.   multivitamin-prenatal 27-0.8 MG Tabs tablet Take 1 tablet by mouth daily at 12 noon.   ondansetron 4 MG disintegrating tablet Commonly known as: Zofran ODT Take 1 tablet (4 mg total) by mouth every 6 (six) hours as needed for nausea.   polyethylene glycol powder 17 GM/SCOOP powder Commonly known as: GLYCOLAX/MIRALAX Take 17 g by mouth daily.   promethazine 12.5 MG tablet Commonly known as: PHENERGAN Take 1 tablet (12.5 mg total) by mouth every 6 (six) hours as needed for nausea or  vomiting.     ASK your doctor about these medications   terconazole 0.4 % vaginal cream Commonly known as: TERAZOL 7 Place 1 applicator vaginally at bedtime.        Fatima Blank Certified Nurse-Midwife 11/09/2019  12:53 PM

## 2019-11-17 ENCOUNTER — Encounter (HOSPITAL_COMMUNITY): Payer: Self-pay | Admitting: Obstetrics and Gynecology

## 2019-11-17 ENCOUNTER — Inpatient Hospital Stay (HOSPITAL_COMMUNITY)
Admission: AD | Admit: 2019-11-17 | Discharge: 2019-11-17 | Disposition: A | Discharge status: Home / Self Care | Payer: Self-pay | Attending: Obstetrics and Gynecology | Admitting: Obstetrics and Gynecology

## 2019-11-17 ENCOUNTER — Other Ambulatory Visit: Payer: Self-pay

## 2019-11-17 DIAGNOSIS — R067 Sneezing: Secondary | ICD-10-CM | POA: Insufficient documentation

## 2019-11-17 DIAGNOSIS — Z8616 Personal history of COVID-19: Secondary | ICD-10-CM | POA: Insufficient documentation

## 2019-11-17 DIAGNOSIS — Z79899 Other long term (current) drug therapy: Secondary | ICD-10-CM | POA: Insufficient documentation

## 2019-11-17 DIAGNOSIS — O99352 Diseases of the nervous system complicating pregnancy, second trimester: Secondary | ICD-10-CM | POA: Insufficient documentation

## 2019-11-17 DIAGNOSIS — Z3A14 14 weeks gestation of pregnancy: Secondary | ICD-10-CM | POA: Insufficient documentation

## 2019-11-17 DIAGNOSIS — O99019 Anemia complicating pregnancy, unspecified trimester: Secondary | ICD-10-CM | POA: Insufficient documentation

## 2019-11-17 DIAGNOSIS — G44219 Episodic tension-type headache, not intractable: Secondary | ICD-10-CM | POA: Insufficient documentation

## 2019-11-17 LAB — URINALYSIS, ROUTINE W REFLEX MICROSCOPIC
Bilirubin Urine: NEGATIVE
Glucose, UA: NEGATIVE mg/dL
Hgb urine dipstick: NEGATIVE
Ketones, ur: NEGATIVE mg/dL
Leukocytes,Ua: NEGATIVE
Nitrite: NEGATIVE
Protein, ur: NEGATIVE mg/dL
Specific Gravity, Urine: 1.01 (ref 1.005–1.030)
pH: 7 (ref 5.0–8.0)

## 2019-11-17 MED ORDER — BUTALBITAL-APAP-CAFFEINE 50-325-40 MG PO TABS: 1.0000 | ORAL_TABLET | Freq: Four times a day (QID) | ORAL | 0 refills | Status: DC | PRN

## 2019-11-17 MED ORDER — DEXAMETHASONE SODIUM PHOSPHATE 10 MG/ML IJ SOLN
10.0000 mg | Freq: Once | INTRAMUSCULAR | Status: AC
  Administered 2019-11-17: 10 mg via INTRAVENOUS
  Filled 2019-11-17: qty 1

## 2019-11-17 MED ORDER — DIPHENHYDRAMINE HCL 50 MG/ML IJ SOLN
25.0000 mg | Freq: Once | INTRAMUSCULAR | Status: AC
  Administered 2019-11-17: 25 mg via INTRAVENOUS
  Filled 2019-11-17: qty 1

## 2019-11-17 MED ORDER — METOCLOPRAMIDE HCL 5 MG/ML IJ SOLN
10.0000 mg | Freq: Once | INTRAMUSCULAR | Status: AC
  Administered 2019-11-17: 10 mg via INTRAVENOUS
  Filled 2019-11-17: qty 2

## 2019-11-17 MED ORDER — LACTATED RINGERS IV BOLUS
1000.0000 mL | Freq: Once | INTRAVENOUS | Status: AC
  Administered 2019-11-17: 1000 mL via INTRAVENOUS

## 2019-11-17 NOTE — MAU Provider Note (Signed)
History     CSN: [id number]  Arrival date and time: 11/17/19 1204   First Provider Initiated Contact with Patient 11/17/19 1300      Chief Complaint  Patient presents with  . Headache  . sneezing   23 y.o. [id number] @14 .0 weeks presenting with HA and sneezing. HA started 2 days ago. Located frontal and describes as throbbing. Rates pain 9/10. She took Tylenol but it only helped for a short time. Associated sx are seeing floaters, no blurry vision. No N/V. Sneezing started yesterday. Denies others respiratory sx. Denies environmental/seasonal allergies. No hx of migraines. She was seen in MAU 1 week ago for HA.   OB History    Gravida  4   Para  1   Term  1   Preterm      AB  2   Living  1     SAB  2   TAB      Ectopic      Multiple  0   Live Births  1           Past Medical History:  Diagnosis Date  . COVID-19   . Pyelonephritis     Past Surgical History:  Procedure Laterality Date  . ABDOMINOPLASTY    . CESAREAN SECTION N/A 11/25/2016   Procedure: CESAREAN SECTION;  Surgeon: Florian Buff, MD;  Location: [facility];  Service: Obstetrics;  Laterality: N/A;  . DILATION AND CURETTAGE OF UTERUS     x2    Family History  Problem Relation Age of Onset  . Diabetes Paternal Uncle   . Cancer Maternal Grandmother   . Diabetes Maternal Grandmother   . Hypertension Maternal Grandmother   . Cancer Paternal Grandmother        liver    Social History   Tobacco Use  . Smoking status: Never Smoker  . Smokeless tobacco: Never Used  Vaping Use  . Vaping Use: Former  Substance Use Topics  . Alcohol use: Not Currently    Comment: not since January  . Drug use: Not Currently    Types: Marijuana    Comment: last used before 09/19/19     Allergies:  Allergies  Allergen Reactions  . Latex Itching    Medications Prior to Admission  Medication Sig Dispense Refill Last Dose  . acetaminophen (TYLENOL) 500 MG tablet Take 1,000 mg by mouth every 6  (six) hours as needed.   11/16/2019 at Unknown time  . cephALEXin (KEFLEX) 500 MG capsule Take 1 capsule (500 mg total) by mouth 4 (four) times daily. 28 capsule 0 11/16/2019 at Unknown time  . metoCLOPramide (REGLAN) 10 MG tablet Take 1 tablet (10 mg total) by mouth every 8 (eight) hours as needed for nausea or vomiting. 30 tablet 0 11/16/2019 at Unknown time  . Prenatal Vit-Fe Fumarate-FA (MULTIVITAMIN-PRENATAL) 27-0.8 MG TABS tablet Take 1 tablet by mouth daily at 12 noon.   11/16/2019 at Unknown time  . docusate sodium (COLACE) 100 MG capsule Take 1 capsule (100 mg total) by mouth 2 (two) times daily as needed. 30 capsule 2 Unknown at Unknown time  . ondansetron (ZOFRAN ODT) 4 MG disintegrating tablet Take 1 tablet (4 mg total) by mouth every 6 (six) hours as needed for nausea. 20 tablet 0 Unknown at Unknown time  . polyethylene glycol powder (GLYCOLAX/MIRALAX) 17 GM/SCOOP powder Take 17 g by mouth daily. 255 g 0 Unknown at Unknown time  . promethazine (PHENERGAN) 12.5 MG tablet Take 1 tablet (12.5  mg total) by mouth every 6 (six) hours as needed for nausea or vomiting. (Patient not taking: Reported on 11/01/2019) 30 tablet 1   . terconazole (TERAZOL 7) 0.4 % vaginal cream Place 1 applicator vaginally at bedtime. 45 g 0 Unknown at Unknown time    Review of Systems  HENT: Positive for sneezing. Negative for congestion and sore throat.   Eyes: Positive for photophobia and visual disturbance.  Respiratory: Negative for cough and shortness of breath.   Gastrointestinal: Negative for abdominal pain.  Genitourinary: Negative for vaginal bleeding.   Physical Exam   Blood pressure 115/60, pulse 93, temperature 98.4 F (36.9 C), temperature source Oral, resp. rate 16, weight 74.1 kg, last menstrual period 08/04/2019, SpO2 99 %, currently breastfeeding.  Physical Exam Vitals and nursing note reviewed. Exam conducted with a chaperone present.  Constitutional:      General: She is not in acute  distress.    Appearance: Normal appearance.  HENT:     Head: Normocephalic and atraumatic.  Cardiovascular:     Rate and Rhythm: Normal rate and regular rhythm.     Heart sounds: Normal heart sounds.  Pulmonary:     Effort: Pulmonary effort is normal. No respiratory distress.     Breath sounds: Normal breath sounds. No stridor. No wheezing, rhonchi or rales.  Musculoskeletal:        General: Normal range of motion.     Cervical back: Normal range of motion and neck supple.  Skin:    General: Skin is warm and dry.  Neurological:     General: No focal deficit present.     Mental Status: She is alert and oriented to person, place, and time.     Cranial Nerves: No cranial nerve deficit.     Sensory: No sensory deficit.     Motor: No weakness.     Coordination: Coordination normal.     Deep Tendon Reflexes: Reflexes normal.  Psychiatric:        Mood and Affect: Mood normal.   FHT 155  Results for orders placed or performed during the hospital encounter of 11/17/19 (from the past 24 hour(s))  Urinalysis, Routine w reflex microscopic     Status: None   Collection Time: 11/17/19 12:31 PM  Result Value Ref Range   Color, Urine YELLOW YELLOW   APPearance CLEAR CLEAR   Specific Gravity, Urine 1.010 1.005 - 1.030   pH 7.0 5.0 - 8.0   Glucose, UA NEGATIVE NEGATIVE mg/dL   Hgb urine dipstick NEGATIVE NEGATIVE   Bilirubin Urine NEGATIVE NEGATIVE   Ketones, ur NEGATIVE NEGATIVE mg/dL   Protein, ur NEGATIVE NEGATIVE mg/dL   Nitrite NEGATIVE NEGATIVE   Leukocytes,Ua NEGATIVE NEGATIVE   MAU Course  Procedures Meds ordered this encounter  Medications  . lactated ringers bolus 1,000 mL  . metoCLOPramide (REGLAN) injection 10 mg  . dexamethasone (DECADRON) injection 10 mg  . diphenhydrAMINE (BENADRYL) injection 25 mg  . butalbital-acetaminophen-caffeine (FIORICET) 50-325-40 MG tablet    Sig: Take 1-2 tablets by mouth every 6 (six) hours as needed for headache.    Dispense:  20 tablet     Refill:  0    Order Specific Question:   Supervising Provider    Answer:   [place] [[id number]]   MDM Labs ordered and reviewed. HA resolved. Recommend Benadryl or Claritin for possible allergies causing sneezing, safe med list provided. Stable for discharge home.  Assessment and Plan   1. Episodic tension-type headache, not intractable  2. [redacted] weeks gestation of pregnancy   3. Sneezing    Discharge home Follow up at [facility] as scheduled Rx Fioricet  Allergies as of 11/17/2019      Reactions   Latex Itching      Medication List    STOP taking these medications   acetaminophen 500 MG tablet Commonly known as: TYLENOL   promethazine 12.5 MG tablet Commonly known as: PHENERGAN     TAKE these medications   butalbital-acetaminophen-caffeine 50-325-40 MG tablet Commonly known as: FIORICET Take 1-2 tablets by mouth every 6 (six) hours as needed for headache.   cephALEXin 500 MG capsule Commonly known as: Keflex Take 1 capsule (500 mg total) by mouth 4 (four) times daily.   docusate sodium 100 MG capsule Commonly known as: COLACE Take 1 capsule (100 mg total) by mouth 2 (two) times daily as needed.   metoCLOPramide 10 MG tablet Commonly known as: REGLAN Take 1 tablet (10 mg total) by mouth every 8 (eight) hours as needed for nausea or vomiting.   multivitamin-prenatal 27-0.8 MG Tabs tablet Take 1 tablet by mouth daily at 12 noon.   ondansetron 4 MG disintegrating tablet Commonly known as: Zofran ODT Take 1 tablet (4 mg total) by mouth every 6 (six) hours as needed for nausea.   polyethylene glycol powder 17 GM/SCOOP powder Commonly known as: GLYCOLAX/MIRALAX Take 17 g by mouth daily.   terconazole 0.4 % vaginal cream Commonly known as: TERAZOL 7 Place 1 applicator vaginally at bedtime.      Live interpreter present for all encounters  Donette Larry, [place] 11/17/2019, 3:32 PM

## 2019-11-17 NOTE — MAU Note (Signed)
Paula Gamble is a 24 y.o. at [redacted]w[redacted]d here in MAU reporting:  +headache Started yesterday Pain score: 8-9/10 Tried tylenol with "very little" relief  +sneezing A couple days ago started   Vitals:   11/17/19 1213  BP: 112/63  Pulse: 93  Resp: 16  Temp: 98.4 F (36.9 C)  SpO2: 99%     Lab orders placed from triage: ua

## 2019-11-17 NOTE — Discharge Instructions (Signed)
Dolor de cabeza general sin causa General Headache Without Cause El dolor de cabeza es un dolor o malestar que se siente en la zona de la cabeza o del cuello. Hay muchas causas y tipos de dolores de [place]. En algunos casos, es posible que no se encuentre la causa. Siga estas indicaciones en su casa: Controle su afeccin para detectar cualquier cambio. Infrmele a su mdico acerca de los cambios. Siga estos pasos para Building surveyor afeccin: Control del [employer] medicamentos de venta libre y los recetados solamente como se lo haya indicado el mdico.  Cuando sienta dolor de cabeza acustese en un cuarto oscuro y tranquilo.  Si se lo indican, aplquese hielo en la cabeza y en la zona del cuello: ? Ponga el hielo en una bolsa plstica. ? Coloque una [employer] piel y Therapist, nutritional. ? Coloque el hielo durante 52minutos, 2a3veces al da.  Si se lo indican, aplique calor en la zona afectada. Use la fuente de calor que el mdico le recomiende, como una compresa de calor hmedo o una almohadilla trmica. ? Coloque una [employer] piel y la fuente de Freight forwarder. ? Aplique calor durante 20 a 77minutos. ? Retire la fuente de calor si la piel se pone de color rojo brillante. Esto es muy importante si no puede Education officer, environmental, calor o fro. Puede correr un riesgo mayor de sufrir quemaduras.  [place] luces tenues si las luces brillantes le molestan o sus dolores de cabeza [place]. Comida y bebida  Mantenga un horario para las comidas.  Si bebe alcohol: ? Limite la cantidad que bebe a lo siguiente:  De 0 a 1 medida por da para las mujeres.  De 0 a 2 medidas por da para los hombres. ? Est atento a la cantidad de alcohol que hay en las bebidas que toma. En los [place], una medida equivale a una botella de cerveza de 12oz (328ml), un vaso de vino de 5oz (129ml) o un vaso de una bebida alcohlica de alta graduacin de 1oz (9ml).  Deje de tomar cafena o reduzca  la cantidad que consume. Indicaciones generales   Lleve un registro diario para averiguar si ciertas cosas provocan los dolores de [place]. Registre, por ejemplo, lo siguiente: ? Lo que usted come y bebe. ? El tiempo que duerme. ? Algn cambio en su dieta o en los medicamentos.  Hgase masajes o pruebe otras formas de relajarse.  Limite el estrs.  Sintese con la espalda recta. No contraiga (tensione) los msculos.  No consuma ningn producto que contenga nicotina o tabaco. Estos incluyen los cigarrillos, el tabaco para Higher education careers adviser y los Psychologist, sport and exercise. Si necesita ayuda para dejar de fumar, consulte al mdico.  Haga ejercicios con regularidad tal como se lo indic el mdico.  Duerma lo suficiente. Esto a menudo significa entre 7 y 9horas de sueo cada noche.  Concurra a todas las visitas de control como se lo haya indicado el mdico. Esto es importante. Comunquese con un mdico si:  Los medicamentos no logran [employer].  Tiene un dolor de cabeza que es diferente a los otros dolores de [place].  Tiene malestar estomacal (nuseas) o vomita.  Tiene fiebre. Solicite ayuda inmediatamente si:  El dolor de [place] empeora rpidamente.  El dolor empeora despus de hacer mucha actividad fsica.  Sigue vomitando.  Presenta rigidez en el cuello.  Tiene dificultad para ver.  Tiene dificultad para hablar.  Siente dolor en el ojo  o en el odo.  Sus msculos estn dbiles, o pierde el control muscular.  Pierde el equilibrio o tiene problemas para Writer.  Siente que va a desvanecerse (perder el conocimiento) o se desmaya.  Est desorientado (confundido).  Tiene una convulsin. Resumen  El dolor de cabeza es un dolor o Tree surgeon que se siente en la zona de la cabeza o del cuello.  Hay muchas causas y tipos de dolores de [place]. En algunos casos, es posible que no se encuentre la causa.  Lleve un diario como ayuda para [employer] causa de los dolores  de [place]. Controle su afeccin para Actuary cambio. Infrmele a su mdico acerca de los cambios.  Comunquese con un mdico si tiene un dolor de cabeza que es diferente de lo habitual o si el dolor de cabeza no se alivia con los medicamentos.  Solicite ayuda de inmediato si el dolor de cabeza es muy intenso, vomita, tiene dificultad para ver, pierde el equilibrio o tiene una convulsin. Esta informacin no tiene Marine scientist el consejo del mdico. Asegrese de hacerle al mdico cualquier pregunta que tenga. Document Revised: 01/11/2018 Document Reviewed: 01/11/2018 Elsevier Patient Education  2020 [place] para tomar durante el embarazo  Safe Medications in Pregnancy  Acn:  Benzoyl Peroxide (Perxido de benzolo)  Salicylic Acid (cido saliclico)  Dolor de espalda/Dolor de cabeza:  Tylenol: 2 pastillas de concentracin regular cada 4 horas O 2 pastillas de concentracin fuerte cada 6 horas  Resfriados/Tos/Alergias:  Benadryl (sin alcohol) 25 mg cada 6 horas segn lo necesite Breath Right strips (Tiras para respirar correctamente)  Claritin  Cepacol (pastillas de chupar para la garganta)  Chloraseptic (aerosol para la garganta)  Cold-Eeze- hasta tres veces por da  Cough drops (pastillas de chupar para la tos, sin alcohol)  Flonase (con receta mdica solamente)  Guaifenesin  Mucinex  Robitussin DM (simple solamente, sin alcohol)  Saline nasal spray/drops (Aerosol nasal salino/gotas) Sudafed (pseudoephedrine) y  Actifed * utilizar slo despus de 12 semanas de gestacin y si no tiene la presin arterial alta.  Tylenol Vicks  VapoRub  Zinc lozenges (pastillas para la garganta)  Zyrtec  Estreimiento:  Colace  Ducolax (supositorios)  Fleet enema (lavado intestinal rectal)  Glycerin (supositorios)  Metamucil  Milk of magnesia (leche de magnesia)  Miralax  Senokot  Smooth Move (t)  Diarrea:  Kaopectate Imodium A-D  *NO tome  Pepto-Bismol  Hemorroides:  Anusol  Anusol HC  Preparation H  Tucks  Indigestin:  Tums  Maalox  Mylanta  Zantac  Pepcid  Insomnia:  Benadryl (sin alcohol) 25mg  cada 6 horas segn lo necesite  Tylenol PM  Unisom, no Gelcaps  Calambres en las piernas:  Tums  MagGel Nuseas/Vmitos:  Bonine  Dramamine  Emetrol  Ginger (extracto)  Sea-Bands  Meclizine  Medicina para las nuseas que puede tomar durante el embarazo: Unisom (doxylamine succinate, pastillas de 25 mg) Tome una pastilla al da al [place]. Si los sntomas no estn adecuadamente controlados, la dosis puede aumentarse hasta una dosis mxima recomendada de [employer] al da (1/2 pastilla por la [place], 1/2 pastilla a media tarde y Ardelia Mems pastilla al [place]). Pastillas de Vitamina B6 de 100mg . Tome [employer] veces al da (hasta 200 mg por da).  Erupciones en la piel:  Productos de Aveeno  Benadryl cream (crema o una dosis de 25mg  cada 6 horas segn lo necesite)  Calamine Lotion (locin)  1% cortisone cream (crema de cortisona de 1%)  nfeccin vaginal por hongos (candidiasis):  Gyne-lotrimin 7  Monistat 7   **Si est tomando varias medicinas, por favor revise las etiquetas para evitar [employer] mismos ingredientes [place]. **Tome la medicina segn lo indicado en la etiqueta. **No tome ms de 400 mg de Tylenol en 24 horas. **No tome medicinas que contengan aspirina o ibuprofeno.

## 2019-11-27 ENCOUNTER — Encounter: Payer: Self-pay | Admitting: General Practice

## 2019-11-29 ENCOUNTER — Ambulatory Visit (INDEPENDENT_AMBULATORY_CARE_PROVIDER_SITE_OTHER): Payer: Self-pay | Admitting: Obstetrics and Gynecology

## 2019-11-29 ENCOUNTER — Other Ambulatory Visit: Payer: Self-pay

## 2019-11-29 VITALS — BP 106/57 | HR 83 | Wt 167.0 lb

## 2019-11-29 DIAGNOSIS — Z8616 Personal history of COVID-19: Secondary | ICD-10-CM

## 2019-11-29 DIAGNOSIS — Z603 Acculturation difficulty: Secondary | ICD-10-CM

## 2019-11-29 DIAGNOSIS — Z98891 History of uterine scar from previous surgery: Secondary | ICD-10-CM

## 2019-11-29 DIAGNOSIS — Z3A15 15 weeks gestation of pregnancy: Secondary | ICD-10-CM

## 2019-11-29 DIAGNOSIS — Z8759 Personal history of other complications of pregnancy, childbirth and the puerperium: Secondary | ICD-10-CM

## 2019-11-29 DIAGNOSIS — Z3482 Encounter for supervision of other normal pregnancy, second trimester: Secondary | ICD-10-CM

## 2019-11-29 DIAGNOSIS — Z3492 Encounter for supervision of normal pregnancy, unspecified, second trimester: Secondary | ICD-10-CM

## 2019-11-29 MED ORDER — BUTALBITAL-APAP-CAFFEINE 50-325-40 MG PO TABS: 1.0000 | ORAL_TABLET | Freq: Four times a day (QID) | ORAL | 0 refills | Status: DC | PRN

## 2019-11-29 NOTE — Progress Notes (Signed)
   PRENATAL VISIT NOTE  Subjective:  Paula Gamble is a 24 y.o. [phone] at [redacted]w[redacted]d being seen today for ongoing prenatal care.  She is currently monitored for the following issues for this low-risk pregnancy and has Abnormal Pap smear of cervix; Supervision of low-risk pregnancy; Language barrier; History of COVID-19; History of pyelonephritis during pregnancy; History of C-section; Obesity during pregnancy; Vaginal yeast infection; GBS (group B streptococcus) UTI complicating pregnancy; and Anemia in pregnancy on their problem list.  Patient reports no complaints.  Contractions: Not present. Vag. Bleeding: None.  Movement: Absent. Denies leaking of fluid.   The following portions of the patient's history were reviewed and updated as appropriate: allergies, current medications, past family history, past medical history, past social history, past surgical history and problem list.   Objective:   Vitals:   11/29/19 1004  BP: (!) 106/57  Pulse: 83  Weight: 167 lb (75.8 kg)    Fetal Status: Fetal Heart Rate (bpm): 150   Movement: Absent     General:  Alert, oriented and cooperative. Patient is in no acute distress.  Skin: Skin is warm and dry. No rash noted.   Cardiovascular: Normal heart rate noted  Respiratory: Normal respiratory effort, no problems with respiration noted  Abdomen: Soft, gravid, appropriate for gestational age.  Pain/Pressure: Present     Pelvic: Cervical exam deferred        Extremities: Normal range of motion.  Edema: None  Mental Status: Normal mood and affect. Normal behavior. Normal judgment and thought content.   Assessment and Plan:  Pregnancy: G4P1021 at [redacted]w[redacted]d 1. Encounter for supervision of low-risk pregnancy in second trimester  Was seen in [facility] for migraine. States she is feeling much better. Takes fioricet as needed and feels it is working well. Discussed oral hydration and if migraines continue we may have her see headache specialist.   Preterm  labor symptoms and general obstetric precautions including but not limited to vaginal bleeding, contractions, leaking of fluid and fetal movement were reviewed in detail with the patient. Please refer to After Visit Summary for other counseling recommendations.   Return in about 4 weeks (around 12/27/2019) for Scheduled with MD to discuss TOLAC and sign consent .  Future Appointments  Date Time Provider Department Center  12/24/2019  9:00 AM [facility]  12/27/2019  8:15 AM Warden Fillers, MD [facility]    Venia Carbon, NP

## 2019-12-21 ENCOUNTER — Encounter: Payer: Self-pay | Admitting: *Deleted

## 2019-12-24 ENCOUNTER — Ambulatory Visit: Payer: Self-pay

## 2019-12-26 ENCOUNTER — Encounter: Payer: Self-pay | Admitting: *Deleted

## 2019-12-26 ENCOUNTER — Other Ambulatory Visit: Payer: Self-pay

## 2019-12-26 ENCOUNTER — Ambulatory Visit: Payer: Self-pay | Admitting: *Deleted

## 2019-12-26 ENCOUNTER — Ambulatory Visit: Payer: Self-pay | Attending: Obstetrics and Gynecology

## 2019-12-26 DIAGNOSIS — Z8616 Personal history of COVID-19: Secondary | ICD-10-CM

## 2019-12-26 DIAGNOSIS — Z3A19 19 weeks gestation of pregnancy: Secondary | ICD-10-CM

## 2019-12-26 DIAGNOSIS — Z98891 History of uterine scar from previous surgery: Secondary | ICD-10-CM | POA: Insufficient documentation

## 2019-12-26 DIAGNOSIS — Z3490 Encounter for supervision of normal pregnancy, unspecified, unspecified trimester: Secondary | ICD-10-CM | POA: Insufficient documentation

## 2019-12-26 DIAGNOSIS — O34219 Maternal care for unspecified type scar from previous cesarean delivery: Secondary | ICD-10-CM

## 2019-12-26 DIAGNOSIS — Z789 Other specified health status: Secondary | ICD-10-CM

## 2019-12-26 DIAGNOSIS — Z8759 Personal history of other complications of pregnancy, childbirth and the puerperium: Secondary | ICD-10-CM | POA: Insufficient documentation

## 2019-12-26 DIAGNOSIS — Z363 Encounter for antenatal screening for malformations: Secondary | ICD-10-CM

## 2019-12-26 DIAGNOSIS — O321XX0 Maternal care for breech presentation, not applicable or unspecified: Secondary | ICD-10-CM

## 2019-12-26 DIAGNOSIS — O99210 Obesity complicating pregnancy, unspecified trimester: Secondary | ICD-10-CM | POA: Insufficient documentation

## 2019-12-26 DIAGNOSIS — O99212 Obesity complicating pregnancy, second trimester: Secondary | ICD-10-CM

## 2019-12-26 DIAGNOSIS — Z87440 Personal history of urinary (tract) infections: Secondary | ICD-10-CM | POA: Insufficient documentation

## 2019-12-27 ENCOUNTER — Other Ambulatory Visit (HOSPITAL_COMMUNITY)
Admission: RE | Admit: 2019-12-27 | Discharge: 2019-12-27 | Disposition: A | Discharge status: Home / Self Care | Payer: Self-pay | Source: Ambulatory Visit | Attending: Obstetrics and Gynecology | Admitting: Obstetrics and Gynecology

## 2019-12-27 ENCOUNTER — Ambulatory Visit (INDEPENDENT_AMBULATORY_CARE_PROVIDER_SITE_OTHER): Payer: Self-pay | Admitting: Obstetrics and Gynecology

## 2019-12-27 ENCOUNTER — Other Ambulatory Visit: Payer: Self-pay | Admitting: General Practice

## 2019-12-27 VITALS — BP 117/60 | HR 90 | Wt 173.0 lb

## 2019-12-27 DIAGNOSIS — Z98891 History of uterine scar from previous surgery: Secondary | ICD-10-CM

## 2019-12-27 DIAGNOSIS — Z789 Other specified health status: Secondary | ICD-10-CM

## 2019-12-27 DIAGNOSIS — Z8759 Personal history of other complications of pregnancy, childbirth and the puerperium: Secondary | ICD-10-CM

## 2019-12-27 DIAGNOSIS — Z3492 Encounter for supervision of normal pregnancy, unspecified, second trimester: Secondary | ICD-10-CM

## 2019-12-27 DIAGNOSIS — Z8616 Personal history of COVID-19: Secondary | ICD-10-CM

## 2019-12-27 DIAGNOSIS — O2342 Unspecified infection of urinary tract in pregnancy, second trimester: Secondary | ICD-10-CM

## 2019-12-27 DIAGNOSIS — Z3A19 19 weeks gestation of pregnancy: Secondary | ICD-10-CM | POA: Insufficient documentation

## 2019-12-27 DIAGNOSIS — B951 Streptococcus, group B, as the cause of diseases classified elsewhere: Secondary | ICD-10-CM

## 2019-12-27 DIAGNOSIS — N898 Other specified noninflammatory disorders of vagina: Secondary | ICD-10-CM | POA: Insufficient documentation

## 2019-12-27 DIAGNOSIS — Z87440 Personal history of urinary (tract) infections: Secondary | ICD-10-CM

## 2019-12-27 NOTE — Progress Notes (Signed)
   PRENATAL VISIT NOTE  Subjective:  Paula Gamble is a 24 y.o. [phone] at [redacted]w[redacted]d being seen today for ongoing prenatal care.  She is currently monitored for the following issues for this low-risk pregnancy and has Abnormal Pap smear of cervix; Supervision of low-risk pregnancy; Language barrier; History of COVID-19; History of pyelonephritis during pregnancy; History of C-section; Obesity during pregnancy; Vaginal yeast infection; GBS (group B streptococcus) UTI complicating pregnancy; and Anemia in pregnancy on their problem list.  Patient doing well with no acute concerns today. She reports vaginal discharge with odor.  Contractions: Not present. Vag. Bleeding: None.  Movement: Present. Denies leaking of fluid.   The following portions of the patient's history were reviewed and updated as appropriate: allergies, current medications, past family history, past medical history, past social history, past surgical history and problem list. Problem list updated.  Objective:   Vitals:   12/27/19 0834  BP: 117/60  Pulse: 90  Weight: 173 lb (78.5 kg)    Fetal Status: Fetal Heart Rate (bpm): 151   Movement: Present     General:  Alert, oriented and cooperative. Patient is in no acute distress.  Skin: Skin is warm and dry. No rash noted.   Cardiovascular: Normal heart rate noted  Respiratory: Normal respiratory effort, no problems with respiration noted  Abdomen: Soft, gravid, appropriate for gestational age.  Pain/Pressure: Present     Pelvic: Cervical exam deferred        Extremities: Normal range of motion.  Edema: None  Mental Status:  Normal mood and affect. Normal behavior. Normal judgment and thought content.   Assessment and Plan:  Pregnancy: G4P1021 at [redacted]w[redacted]d  1. Encounter for supervision of low-risk pregnancy in second trimester Review of chart shows [place] has been completed and has been scanned in  2. Vaginal odor Will check self swab - Cervicovaginal ancillary only(  [place])  3. Group B Streptococcus urinary tract infection affecting pregnancy in second trimester Treat in labor  4. Language barrier Interpreter box was utilized  5. History of COVID-19   6. History of pyelonephritis during pregnancy No s/sx of recurrent infection  7. History of C-section Pt desires TOLAC, will need counseling as pregnancy progresses  Preterm labor symptoms and general obstetric precautions including but not limited to vaginal bleeding, contractions, leaking of fluid and fetal movement were reviewed in detail with the patient.  Please refer to After Visit Summary for other counseling recommendations.   Return in about 4 weeks (around 01/24/2020) for ROB, in person.   Mariel Aloe, MD

## 2019-12-27 NOTE — Patient Instructions (Signed)
Segundo trimestre de embarazo Second Trimester of Pregnancy  El segundo trimestre va desde la semana14 hasta la 27 (desde el mes 4 hasta el 6). Este suele ser el momento en el que mejor se siente. En general, las nuseas matutinas han disminuido o han desaparecido completamente. Tendr ms energa y podr aumentarle el apetito. El beb en gestacin se desarrolla rpidamente. Hacia el final del sexto mes, el beb mide aproximadamente 9 pulgadas (23 cm) y pesa alrededor de 1 libras (700 g). Es probable que sienta al beb moverse entre las 18 y 20 semanas del embarazo. Siga estas indicaciones en su casa: Medicamentos  Tome los medicamentos de venta libre y los recetados solamente como se lo haya indicado el mdico. Algunos medicamentos son seguros para tomar durante el embarazo y otros no lo son.  Tome vitaminas prenatales que contengan por lo menos 600microgramos (?g) de cido flico.  Si tiene dificultad para mover el intestino (estreimiento), tome un medicamento para ablandar las heces (laxante) si su mdico se lo autoriza. Comida y bebida   Ingiera alimentos saludables de manera regular.  No coma carne cruda ni quesos sin cocinar.  Si obtiene poca cantidad de calcio de los alimentos que ingiere, consulte a su mdico sobre la posibilidad de tomar un suplemento diario de calcio.  Evite el consumo de alimentos ricos en grasas y azcares, como los alimentos fritos y los dulces.  Si tiene malestar estomacal (nuseas) o devuelve (vomita): ? Ingiera 4 o 5comidas pequeas por da en lugar de 3abundantes. ? Intente comer algunas galletitas saladas. ? Beba lquidos entre las comidas, en lugar de hacerlo durante estas.  Para evitar el estreimiento: ? Consuma alimentos ricos en fibra, como frutas y verduras frescas, cereales integrales y frijoles. ? Beba suficiente lquido para mantener el pis (orina) claro o de color amarillo plido. Actividad  Haga ejercicios solamente como se lo haya  indicado el mdico. Interrumpa la actividad fsica si comienza a tener calambres.  No haga ejercicio si hace demasiado calor, hay demasiada humedad o se encuentra en un lugar de mucha altura (altitud alta).  Evite levantar pesos excesivos.  Use zapatos con tacones bajos. Mantenga una buena postura al sentarse y pararse.  Puede continuar teniendo relaciones sexuales, a menos que el mdico le indique lo contrario. Alivio del dolor y del malestar  Use un sostn que le brinde buen soporte si sus mamas estn sensibles.  Dese baos de asiento con agua tibia para aliviar el dolor o las molestias causadas por las hemorroides. Use una crema para las hemorroides si el mdico la autoriza.  Descanse con las piernas elevadas si tiene calambres o dolor de cintura.  Si desarrolla venas hinchadas y abultadas (vrices) en las piernas: ? Use medias de compresin o medias de descanso como se lo haya indicado el mdico. ? Levante (eleve) los pies durante 15minutos, 3 o 4veces por da. ? Limite el consumo de sal en sus alimentos. Cuidado prenatal  Escriba sus preguntas. Llvelas cuando concurra a las visitas prenatales.  Concurra a todas las visitas prenatales como se lo haya indicado el mdico. Esto es importante. Seguridad  Colquese el cinturn de seguridad cuando conduzca.  Haga una lista de los nmeros de telfono de emergencia, que incluya los nmeros de telfono de familiares, amigos, el hospital, as como los departamentos de polica y bomberos. Instrucciones generales  Consulte a su mdico sobre los alimentos que debe comer o pdale que la ayude a encontrar a quien pueda aconsejarla si necesita ese servicio.    Consulte a su mdico acerca de dnde se dictan clases prenatales cerca de donde vive. Comience las clases antes del mes 6 de embarazo.  No se d baos de inmersin en agua caliente, baos turcos ni saunas.  No se haga duchas vaginales ni use tampones o toallas higinicas perfumadas.   No mantenga las piernas cruzadas durante mucho tiempo.  Vaya al dentista si an no lo hizo. Use un cepillo de cerdas suaves para cepillarse los dientes. Psese el hilo dental suavemente.  No fume, no consuma hierbas ni beba alcohol. No tome frmacos que el mdico no haya autorizado.  No consuma ningn producto que contenga nicotina o tabaco, como cigarrillos y cigarrillos electrnicos. Si necesita ayuda para dejar de fumar, consulte al mdico.  Evite el contacto con las bandejas sanitarias de los gatos y la tierra que estos animales usan. Estos elementos contienen bacterias que pueden causar defectos congnitos al beb y la posible prdida del beb (aborto espontneo) o la muerte fetal. Comunquese con un mdico si:  Tiene clicos leves o siente presin en la parte baja del vientre.  Tiene dolor al hacer pis (orinar).  Advierte un lquido con olor ftido que proviene de la vagina.  Tiene malestar estomacal (nuseas), devuelve (vomita) o tiene deposiciones acuosas (diarrea).  Sufre un dolor persistente en el abdomen.  Siente mareos. Solicite ayuda de inmediato si:  Tiene fiebre.  Tiene una prdida de lquido por la vagina.  Tiene sangrado o pequeas prdidas vaginales.  Siente dolor intenso o clicos en el abdomen.  Sube o baja de peso rpidamente.  Tiene dificultades para recuperar el aliento y siente dolor en el pecho.  Sbitamente se le hinchan mucho el rostro, las manos, los tobillos, los pies o las piernas.  No ha sentido los movimientos del beb durante una hora.  Siente un dolor de cabeza intenso que no se alivia al tomar medicamentos.  Tiene dificultad para ver. Resumen  El segundo trimestre va desde la semana14 hasta la 27, desde el mes 4 hasta el 6. Este suele ser el momento en el que mejor se siente.  Para cuidarse y cuidar a su beb en gestacin, debe comer alimentos saludables, tomar medicamentos solamente si su mdico le indica que lo haga y hacer  actividades que sean seguras para usted y su beb.  Llame al mdico si se enferma o si nota algo inusual acerca de su embarazo. Tambin llame al mdico si necesita ayuda para saber qu alimentos debe comer o si quiere saber qu actividades puede realizar de forma segura. Esta informacin no tiene como fin reemplazar el consejo del mdico. Asegrese de hacerle al mdico cualquier pregunta que tenga. Document Revised: 02/02/2017 Document Reviewed: 02/02/2017 Elsevier Patient Education  2020 Elsevier Inc.  

## 2019-12-28 LAB — CERVICOVAGINAL ANCILLARY ONLY
Bacterial Vaginitis (gardnerella): POSITIVE — AB
Candida Glabrata: NEGATIVE
Candida Vaginitis: POSITIVE — AB
Comment: NEGATIVE
Comment: NEGATIVE
Comment: NEGATIVE
Comment: NEGATIVE
Trichomonas: NEGATIVE

## 2019-12-31 ENCOUNTER — Telehealth (INDEPENDENT_AMBULATORY_CARE_PROVIDER_SITE_OTHER): Payer: Self-pay | Admitting: Lactation Services

## 2019-12-31 DIAGNOSIS — B9689 Other specified bacterial agents as the cause of diseases classified elsewhere: Secondary | ICD-10-CM

## 2019-12-31 DIAGNOSIS — B373 Candidiasis of vulva and vagina: Secondary | ICD-10-CM

## 2019-12-31 DIAGNOSIS — N760 Acute vaginitis: Secondary | ICD-10-CM

## 2019-12-31 DIAGNOSIS — B3731 Acute candidiasis of vulva and vagina: Secondary | ICD-10-CM

## 2019-12-31 MED ORDER — TERCONAZOLE 0.4 % VA CREA
1.0000 | TOPICAL_CREAM | Freq: Every day | VAGINAL | 0 refills | Status: DC
Start: 2019-12-31 — End: 2020-02-21

## 2019-12-31 MED ORDER — METRONIDAZOLE 500 MG PO TABS
500.0000 mg | ORAL_TABLET | Freq: Two times a day (BID) | ORAL | 0 refills | Status: DC
Start: 2019-12-31 — End: 2020-02-21

## 2019-12-31 NOTE — Telephone Encounter (Signed)
Called patient with assistance of [employer] [place], Angelique Blonder # [phone].   Spoke with patient and informed her that she is positive for BV and yeast. Reviewed to take the Flagyl completely and then take the Diflucan.   Patient with no questions or concerns at this time.

## 2019-12-31 NOTE — Telephone Encounter (Signed)
-----   Message from Warden Fillers, MD sent at 12/28/2019 12:07 PM EDT ----- BV and yeast on swab, treat with flagyl 500 mg po BID x 7 days, rx for terazol

## 2020-01-10 ENCOUNTER — Encounter: Payer: Self-pay | Admitting: General Practice

## 2020-01-17 ENCOUNTER — Inpatient Hospital Stay (HOSPITAL_COMMUNITY)
Admission: AD | Admit: 2020-01-17 | Discharge: 2020-01-18 | Disposition: A | Discharge status: Home / Self Care | Payer: Self-pay | Attending: Obstetrics & Gynecology | Admitting: Obstetrics & Gynecology

## 2020-01-17 ENCOUNTER — Other Ambulatory Visit: Payer: Self-pay

## 2020-01-17 ENCOUNTER — Encounter (HOSPITAL_COMMUNITY): Payer: Self-pay | Admitting: Obstetrics & Gynecology

## 2020-01-17 DIAGNOSIS — N858 Other specified noninflammatory disorders of uterus: Secondary | ICD-10-CM

## 2020-01-17 DIAGNOSIS — Z8616 Personal history of COVID-19: Secondary | ICD-10-CM | POA: Insufficient documentation

## 2020-01-17 DIAGNOSIS — Z8759 Personal history of other complications of pregnancy, childbirth and the puerperium: Secondary | ICD-10-CM

## 2020-01-17 DIAGNOSIS — O99891 Other specified diseases and conditions complicating pregnancy: Secondary | ICD-10-CM

## 2020-01-17 DIAGNOSIS — Z98891 History of uterine scar from previous surgery: Secondary | ICD-10-CM

## 2020-01-17 DIAGNOSIS — R1032 Left lower quadrant pain: Secondary | ICD-10-CM | POA: Insufficient documentation

## 2020-01-17 DIAGNOSIS — N859 Noninflammatory disorder of uterus, unspecified: Secondary | ICD-10-CM

## 2020-01-17 DIAGNOSIS — Z3A22 22 weeks gestation of pregnancy: Secondary | ICD-10-CM | POA: Insufficient documentation

## 2020-01-17 DIAGNOSIS — R1031 Right lower quadrant pain: Secondary | ICD-10-CM | POA: Insufficient documentation

## 2020-01-17 DIAGNOSIS — Z789 Other specified health status: Secondary | ICD-10-CM

## 2020-01-17 DIAGNOSIS — O99210 Obesity complicating pregnancy, unspecified trimester: Secondary | ICD-10-CM

## 2020-01-17 DIAGNOSIS — O26892 Other specified pregnancy related conditions, second trimester: Secondary | ICD-10-CM | POA: Insufficient documentation

## 2020-01-17 DIAGNOSIS — Z79899 Other long term (current) drug therapy: Secondary | ICD-10-CM | POA: Insufficient documentation

## 2020-01-17 LAB — URINALYSIS, ROUTINE W REFLEX MICROSCOPIC
Bacteria, UA: NONE SEEN
Bilirubin Urine: NEGATIVE
Glucose, UA: NEGATIVE mg/dL
Hgb urine dipstick: NEGATIVE
Ketones, ur: NEGATIVE mg/dL
Nitrite: NEGATIVE
Protein, ur: NEGATIVE mg/dL
Specific Gravity, Urine: 1.012 (ref 1.005–1.030)
pH: 7 (ref 5.0–8.0)

## 2020-01-17 MED ORDER — IBUPROFEN 600 MG PO TABS
600.0000 mg | ORAL_TABLET | Freq: Once | ORAL | Status: AC
  Administered 2020-01-17: 600 mg via ORAL
  Filled 2020-01-17: qty 1

## 2020-01-17 NOTE — MAU Provider Note (Signed)
Chief Complaint:  Contractions   Provider saw patient at 2300hrs   HPI: Paula Gamble is a 24 y.o. [phone] at 46w5dwho presents to maternity admissions reporting Painful contractions every 10 minutes since 1800hrs.  No history of PTL. She reports good fetal movement, denies LOF, vaginal bleeding, vaginal itching/burning, urinary symptoms, h/a, dizziness, n/v, diarrhea, constipation or fever/chills.  .  Abdominal Pain This is a new problem. The current episode started today. The problem occurs intermittently. The problem has been unchanged. The pain is located in the LLQ and RLQ. The pain is moderate. The quality of the pain is cramping. The abdominal pain does not radiate. Pertinent negatives include no constipation, diarrhea, dysuria, fever, frequency, headaches, myalgias, nausea or vomiting. Nothing aggravates the pain. The pain is relieved by nothing. She has tried nothing for the symptoms.     RN Note: Feeling some ctxs about every since 1800. Denies LOF or VB  Past Medical History: Past Medical History:  Diagnosis Date  . COVID-19   . Pyelonephritis     Past obstetric history: OB History  Gravida Para Term Preterm AB Living  4 1 1   2 1   SAB TAB Ectopic Multiple Live Births  2     0 1    # Outcome Date GA Lbr Len/2nd Weight Sex Delivery Anes PTL Lv  4 Current           3 Term 11/25/16 [redacted]w[redacted]d  3901 g M CS-LTranv EPI  LIV  2 SAB 2016 [redacted]w[redacted]d         1 SAB             Past Surgical History: Past Surgical History:  Procedure Laterality Date  . ABDOMINOPLASTY    . CESAREAN SECTION N/A 11/25/2016   Procedure: CESAREAN SECTION;  Surgeon: 01/26/2017, MD;  Location: [facility] BIRTHING SU[facility];  Service: Obstetrics;  Laterality: N/A;  . DILATION AND CURETTAGE OF UTERUS     x2    Family History: Family History  Problem Relation Age of Onset  . Diabetes Paternal Uncle   . Cancer Maternal Grandmother   . Diabetes Maternal Grandmother   . Hypertension Maternal  Grandmother   . Cancer Paternal Grandmother        liver    Social History: Social History   Tobacco Use  . Smoking status: Never Smoker  . Smokeless tobacco: Never Used  Vaping Use  . Vaping Use: Former  Substance Use Topics  . Alcohol use: Not Currently    Comment: not since January  . Drug use: Not Currently    Types: Marijuana    Comment: last used before 09/19/19     Allergies:  Allergies  Allergen Reactions  . Latex Itching    Meds:  Medications Prior to Admission  Medication Sig Dispense Refill Last Dose  . acetaminophen (TYLENOL) 500 MG tablet Take 500 mg by mouth every 6 (six) hours as needed.   Past Week at Unknown time  . butalbital-acetaminophen-caffeine (FIORICET) 50-325-40 MG tablet Take 1-2 tablets by mouth every 6 (six) hours as needed for headache. 20 tablet 0 01/16/2020 at Unknown time  . Prenatal Vit-Fe Fumarate-FA (MULTIVITAMIN-PRENATAL) 27-0.8 MG TABS tablet Take 1 tablet by mouth daily at 12 noon.   01/17/2020 at Unknown time  . metroNIDAZOLE (FLAGYL) 500 MG tablet Take 1 tablet (500 mg total) by mouth 2 (two) times daily. 14 tablet 0   . terconazole (TERAZOL 7) 0.4 % vaginal cream Place 1 applicator vaginally at  bedtime. 45 g 0     I have reviewed patient's Past Medical Hx, Surgical Hx, Family Hx, Social Hx, medications and allergies.   ROS:  Review of Systems  Constitutional: Negative for fever.  Gastrointestinal: Positive for abdominal pain. Negative for constipation, diarrhea, nausea and vomiting.  Genitourinary: Negative for dysuria and frequency.  Musculoskeletal: Negative for myalgias.  Neurological: Negative for headaches.   Other systems negative  Physical Exam   Patient Vitals for the past 24 hrs:  BP Temp Pulse Resp SpO2 Height Weight  01/17/20 2251 (!) 107/52 -- 81 -- 100 % -- --  01/17/20 2231 (!) 106/57 -- 77 -- -- -- --  01/17/20 2230 -- 98.5 F (36.9 C) -- 16 -- 5\' 1"  (1.549 m) 79.8 kg   Constitutional: Well-developed,  well-nourished female in no acute distress.  Cardiovascular: normal rate and rhythm Respiratory: normal effort, clear to auscultation bilaterally GI: Abd soft, non-tender, gravid appropriate for gestational age.   No rebound or guarding. MS: Extremities nontender, no edema, normal ROM Neurologic: Alert and oriented x 4.  GU: Neg CVAT.  PELVIC EXAM: Dilation: Closed Effacement (%): Thick Cervical Position: Posterior Station: Ballotable Presentation: Undeterminable Exam by:: [email or web address] CNM  FHT:   150 , Contractions: Uterine irritability   Labs: Results for orders placed or performed during the hospital encounter of 01/17/20 (from the past 24 hour(s))  Urinalysis, Routine w reflex microscopic Urine, Clean Catch     Status: Abnormal   Collection Time: 01/17/20 10:50 PM  Result Value Ref Range   Color, Urine STRAW (A) YELLOW   APPearance CLEAR CLEAR   Specific Gravity, Urine 1.012 1.005 - 1.030   pH 7.0 5.0 - 8.0   Glucose, UA NEGATIVE NEGATIVE mg/dL   Hgb urine dipstick NEGATIVE NEGATIVE   Bilirubin Urine NEGATIVE NEGATIVE   Ketones, ur NEGATIVE NEGATIVE mg/dL   Protein, ur NEGATIVE NEGATIVE mg/dL   Nitrite NEGATIVE NEGATIVE   Leukocytes,Ua TRACE (A) NEGATIVE   RBC / HPF 0-5 0 - 5 RBC/hpf   WBC, UA 0-5 0 - 5 WBC/hpf   Bacteria, UA NONE SEEN NONE SEEN   Squamous Epithelial / LPF 0-5 0 - 5    O/Positive/-- (06/10 1206)  Imaging:    MAU Course/MDM: I have ordered labs and reviewed results. UA is clear .  Treatments in MAU included ibuprofen for cramping which did diminish it.  Discussed uterine irritability and signs of PTL. 10-19-1969    Assessment: Single IUP at [redacted]w[redacted]d Uterine irritability No change in cervix  Plan: Discharge home Preterm Labor precautions and fetal kick counts Follow up in Office for prenatal visits and recheck as necessary  Encouraged to return here or to other Urgent Care/ED if she develops worsening of symptoms, increase in pain, fever, or other  concerning symptoms.   Pt stable at time of discharge.  [redacted]w[redacted]d CNM, MSN Certified Nurse-Midwife 01/17/2020 11:09 PM

## 2020-01-17 NOTE — MAU Note (Signed)
Feeling some ctxs about every since 1800. Denies LOF or VB

## 2020-01-17 NOTE — Discharge Instructions (Signed)
Informacin sobre parto y trabajo de parto prematuros Preterm Labor and Birth Information El embarazo tiene generalmente una duracin de 39 a 41 semanas. El trabajo de parto es prematuro cuando se inicia muy pronto. Comienza antes de completar las 37 semanas de embarazo. Cules son los factores de riesgo del trabajo de parto prematuro? Existen mayores probabilidades de trabajo de parto prematuro en mujeres con las siguientes caractersticas:  Tuvieron una infeccin durante el embarazo.  El cuello uterino es corto.  Tuvieron trabajo de parto prematuro anteriormente.  Se sometieron a una ciruga en el cuello uterino.  Son menores de 17aos.  Tienen ms de 35aos.  Son afroamericanas.  Estn embarazadas de dos o ms bebs.  Consumen drogas mientras estn embarazadas.  Fuman mientras estn embarazadas.  No aumentan de peso lo suficiente durante el embarazo.  Se embarazaron inmediatamente despus de otro embarazo. Cules son los sntomas del trabajo de parto prematuro? Los sntomas del trabajo de parto prematuro incluyen lo siguiente:  Calambres. Los calambres pueden parecerse a los que tiene una mujer durante el perodo menstrual. Los calambres pueden presentarse con diarrea.  Dolor de vientre (abdomen).  Dolor en la zona lumbar.  Tiene contracciones regulares o endurecimiento del tero. Siente como si el vientre se endurece.  Presin en la zona inferior del vientre que parece empeorar.  Pierde ms lquido (secrecin) por la vagina. El lquido puede ser acuoso o con sangre.  Ruptura de la bolsa de aguas. Por qu es importante notar los signos del trabajo de parto prematuro? Los bebs que nacen antes de tiempo pueden no estar completamente desarrollados. Estos pueden tener un riesgo mayor de padecer:  Problemas cardacos a largo plazo.  Problemas pulmonares a largo plazo.  Dificultades para controlar los sistemas corporales, por ejemplo, respirar.  Hemorragia  cerebral.  Una afeccin que se denomina parlisis cerebral.  Dificultades en el aprendizaje.  Muerte. Estos riesgos son mucho mayores para bebs que nacen antes de las 34semanas de embarazo. Cmo se trata el trabajo de parto prematuro? El tratamiento depende de lo siguiente:  El tiempo de embarazo.  Su estado de salud.  La salud del beb. El tratamiento puede incluir lo siguiente:  Un punto (sutura) en el cuello uterino. Al parir, el cuello uterino se abre para que el beb pueda salir. El punto impide que el cuello uterino se abra antes de tiempo.  Permanecer en el hospital.  Tomar medicamentos como, por ejemplo: ? Medicamentos hormonales. ? Medicamentos para detener las contracciones. ? Medicamentos para ayudar a la maduracin de los pulmones del beb. ? Medicamentos para evitar que el beb desarrolle parlisis cerebral. Qu debo hacer si estoy en trabajo de parto prematuro? Si cree que est en trabajo de parto demasiado pronto, llame a su mdico de inmediato. Cmo puedo prevenir el trabajo de parto prematuro?  No use productos que contengan tabaco. ? Estos incluyen cigarrillos, tabaco para mascar y cigarrillos electrnicos. ? Si necesita ayuda para dejar de fumar, consulte al mdico.  No consuma drogas.  No tome ningn medicamento si el mdico no se lo indic.  Consulte al mdico antes de empezar a tomar cualquier suplemento de hierbas.  Asegrese aumentar de peso como corresponde.  Tenga cuidado con las infecciones. Si cree que puede tener una infeccin, consulte al mdico para que la revisen inmediatamente.  Infrmele al mdico si ha tenido trabajo de parto prematuro anteriormente. Esta informacin no tiene como fin reemplazar el consejo del mdico. Asegrese de hacerle al mdico cualquier pregunta que tenga. Document   Revised: 08/18/2016 Document Reviewed: 10/01/2015 Elsevier Patient Education  2020 [employer]  

## 2020-01-24 ENCOUNTER — Other Ambulatory Visit: Payer: Self-pay

## 2020-01-24 ENCOUNTER — Encounter: Payer: Self-pay | Admitting: Women's Health

## 2020-01-24 ENCOUNTER — Telehealth (INDEPENDENT_AMBULATORY_CARE_PROVIDER_SITE_OTHER): Payer: Self-pay | Admitting: Women's Health

## 2020-01-24 DIAGNOSIS — O99019 Anemia complicating pregnancy, unspecified trimester: Secondary | ICD-10-CM

## 2020-01-24 DIAGNOSIS — D649 Anemia, unspecified: Secondary | ICD-10-CM

## 2020-01-24 DIAGNOSIS — B951 Streptococcus, group B, as the cause of diseases classified elsewhere: Secondary | ICD-10-CM

## 2020-01-24 DIAGNOSIS — Z8616 Personal history of COVID-19: Secondary | ICD-10-CM

## 2020-01-24 DIAGNOSIS — Z3A23 23 weeks gestation of pregnancy: Secondary | ICD-10-CM

## 2020-01-24 DIAGNOSIS — O99012 Anemia complicating pregnancy, second trimester: Secondary | ICD-10-CM

## 2020-01-24 DIAGNOSIS — Z3492 Encounter for supervision of normal pregnancy, unspecified, second trimester: Secondary | ICD-10-CM

## 2020-01-24 DIAGNOSIS — Z603 Acculturation difficulty: Secondary | ICD-10-CM

## 2020-01-24 DIAGNOSIS — Z789 Other specified health status: Secondary | ICD-10-CM

## 2020-01-24 DIAGNOSIS — O99210 Obesity complicating pregnancy, unspecified trimester: Secondary | ICD-10-CM

## 2020-01-24 DIAGNOSIS — O34219 Maternal care for unspecified type scar from previous cesarean delivery: Secondary | ICD-10-CM

## 2020-01-24 DIAGNOSIS — Z98891 History of uterine scar from previous surgery: Secondary | ICD-10-CM

## 2020-01-24 DIAGNOSIS — O2340 Unspecified infection of urinary tract in pregnancy, unspecified trimester: Secondary | ICD-10-CM

## 2020-01-24 NOTE — Progress Notes (Signed)
I connected with Paula Gamble 01/24/20 at  8:15 AM EDT by: MyChart video and verified that I am speaking with the correct person using two identifiers.  Patient is located at car and provider is located at [facility].     The purpose of this virtual visit is to provide medical care while limiting exposure to the novel coronavirus. I discussed the limitations, risks, security and privacy concerns of performing an evaluation and management service by MyChart video and the availability of in person appointments. I also discussed with the patient that there may be a patient responsible charge related to this service. By engaging in this virtual visit, you consent to the provision of healthcare.  Additionally, you authorize for your insurance to be billed for the services provided during this visit.  The patient expressed understanding and agreed to proceed.  The following staff members participated in the virtual visit:  Donia Ast, NP    PRENATAL VISIT NOTE  Subjective:  Paula Gamble is a 24 y.o. [id number] at [redacted]w[redacted]d  for phone visit for ongoing prenatal care.  She is currently monitored for the following issues for this low-risk pregnancy and has Supervision of low-risk pregnancy; Language barrier; History of COVID-19; History of pyelonephritis during pregnancy; History of C-section; Obesity during pregnancy; GBS (group B streptococcus) UTI complicating pregnancy; and Anemia in pregnancy on their problem list.  Patient reports sore throat and cough x4days.  Contractions: Not present. Vag. Bleeding: None.  Movement: Present. Denies leaking of fluid.   The following portions of the patient's history were reviewed and updated as appropriate: allergies, current medications, past family history, past medical history, past social history, past surgical history and problem list.   Objective:  There were no vitals filed for this visit. Pt does have BP cuff at home, will take BP and pulse  when she gets home and call clinic with results.  Fetal Status:     Movement: Present     Assessment and Plan:  Pregnancy: [id number] at [redacted]w[redacted]d  1. Encounter for supervision of low-risk pregnancy in second trimester - anticipatory guidance given on upcoming visits - GTT/labs next visit  2. History of COVID-19 - pt unable to have in-person appt today d/t new onset cough/sore throat x4days - pt advised to get test for COVID - pt had COVID 04/2019 - pt has not been vaccinated and reports she was told she could not get it because she was pregnant - briefly discussed safety of vaccination in pregnancy, but pt advised she should not get it while she is sick, and if she receives a positive test, that would also determine the timeframe during which she could get the vaccine - pt encouraged to get COVID test and discussed isolation and office visits in light of possible positive test - discussed s/sx requiring hospital visitation  3. History of C-section - desires TOLAC  4. Language barrier - Spanish interpreter used for entire visit  5. Obesity during pregnancy  6. Group B Streptococcus urinary tract infection affecting pregnancy, antepartum - treat in labor - needs TOC at next office visit - pt confirms she took ABX  7. Antepartum anemia CBC Latest Ref Rng & Units 11/09/2019 11/01/2019 10/28/2019  WBC 4.0 - 10.5 K/uL 8.4 9.4 12.2(H)  Hemoglobin 12.0 - 15.0 g/dL 10.9(L) 12.0 12.5  Hematocrit 36 - 46 % 32.3(L) 35.0 36.6  Platelets 150 - 400 K/uL 169 182 195  - on oral iron  Preterm labor symptoms and general obstetric precautions  including but not limited to vaginal bleeding, contractions, leaking of fluid and fetal movement were reviewed in detail with the patient. I discussed the assessment and treatment plan with the patient. The patient was provided an opportunity to ask questions and all were answered. The patient agreed with the plan and demonstrated an understanding of the  instructions. The patient was advised to call back or seek an in-person office evaluation/go to MAU at [facility] for any urgent or concerning symptoms.  Return in about 4 weeks (around 02/21/2020) for in-person LOB/APP OK/GTT/labs.  No future appointments.   Time spent on virtual visit: 10 minutes  Marylen Ponto, NP

## 2020-01-24 NOTE — Patient Instructions (Addendum)
Maternity Assessment Unit (MAU)  The Maternity Assessment Unit (MAU) is located at the [facility] at [facility]. The address is: [address], [place], [place], [place] [address]. Please see map below for additional directions.    The Maternity Assessment Unit is designed to help you during your pregnancy, and for up to 6 weeks after delivery, with any pregnancy- or postpartum-related emergencies, if you think you are in labor, or if your water has broken. For example, if you experience nausea and vomiting, vaginal bleeding, severe abdominal or pelvic pain, elevated blood pressure or other problems related to your pregnancy or postpartum time, please come to the Maternity Assessment Unit for assistance.        Segundo trimestre de Public Service Enterprise Group Trimester of Pregnancy El segundo trimestre va desde la semana14 hasta la 27, desde el cuarto hasta el sexto mes, y suele ser el momento en el que mejor se siente. Su organismo se ha adaptado a Charity fundraiser, y comienza a Diplomatic Services operational officer. En general, las nuseas matutinas han disminuido o han desaparecido completamente, puede tener ms energa y un aumento de apetito. El segundo trimestre es tambin la poca en la que el feto se desarrolla rpidamente. Hacia el final del sexto mes, el feto mide aproximadamente 9pulgadas (23cm) y pesa alrededor de 1 libras (700g). Es probable que sienta que el beb se Teacher, English as a foreign language (da pataditas) entre las 16 y 20semanas del Psychiatrist. Cambios en el cuerpo durante el segundo trimestre Su cuerpo continua experimentando numerosos cambios durante su segundo trimestre. Estos cambios varan de [place] a [place].  Seguir American Standard Companies. Notar que la parte baja del abdomen sobresale.  Podrn aparecer las primeras Albertson's caderas, el abdomen y las [place].  Es posible que tenga dolores de cabeza que pueden aliviarse con ciertos medicamentos. Los medicamentos que tome  deben estar aprobados por el mdico.  Tal vez tenga necesidad de orinar con ms frecuencia porque el feto est ejerciendo presin sobre la vejiga.  Debido al Vanetta Mulders podr sentir Anthoney Harada estomacal con frecuencia.  Puede estar estreida, ya que ciertas hormonas enlentecen los movimientos de los msculos que New York Life Insurance desechos a travs de los intestinos.  Pueden aparecer hemorroides o abultarse e hincharse las venas (venas varicosas).  Puede sentir dolor en la espalda. Esto se debe a: ? Aumento de peso. ? Las hormonas del Management consultant las articulaciones en la pelvis. ? Un cambio en el peso y los msculos que ayudan a Pharmacologist su equilibrio.  Sus pechos seguirn creciendo y se pondrn cada vez ms sensibles.  Las Veterinary surgeon y estar sensibles al cepillado y al hilo dental.  Pueden aparecer zonas oscuras o manchas (cloasma, mscara del [place]) en el rostro. Esto probablemente se atenuar despus del nacimiento del beb.  Es posible que se forme una lnea oscura desde el ombligo hasta la zona del pubis (linea nigra). Esto probablemente se atenuar despus del nacimiento del beb.  Tal vez haya cambios en el cabello. Esto cambios pueden incluir su engrosamiento, crecimiento rpido y Allied Waste Industries textura. Adems, a algunas mujeres se les cae el cabello durante o despus del embarazo, o tienen el cabello seco o fino. Lo ms probable es que el cabello se le normalice despus del nacimiento del beb. Qu debe esperar en las visitas prenatales Durante una visita prenatal de rutina:  La pesarn para asegurarse de que usted y el feto estn creciendo normalmente.  Le tomarn la presin arterial.  Le medirn el abdomen para controlar el desarrollo del beb.  Se escucharn los latidos cardacos fetales.  Se evaluarn los resultados de los estudios solicitados en visitas anteriores. El mdico puede preguntarle lo siguiente:  Cmo se siente.  Si siente los movimientos del  beb.  Si ha tenido sntomas anormales, como prdida de lquido, [place], dolores de cabeza intensos o clicos abdominales.  Si est consumiendo algn producto que contenga tabaco, como cigarrillos, tabaco de Theatre manager y Administrator, Civil Service.  Si tiene Colgate-Palmolive. Otros estudios que podrn realizarse durante el segundo trimestre incluyen lo siguiente:  Anlisis de sangre para detectar lo siguiente: ? Concentraciones de hierro bajas (anemia). ? Nivel alto de azcar en la sangre que afecta a las mujeres embarazadas (diabetes gestacional) entre las semanas 24 y 23. ? Anticuerpos Rh. Esto es para detectar una protena en los glbulos rojos (factor Rh).  Anlisis de orina para detectar infecciones, diabetes o protenas en la orina.  Una ecografa para confirmar que el beb crece y se desarrolla correctamente.  Una amniocentesis para diagnosticar posibles problemas genticos.  Estudios del feto para descartar espina bfida y sndrome de Down.  Prueba del VIH (virus de inmunodeficiencia humana). Los exmenes prenatales de rutina incluyen la prueba de deteccin del VIH, a menos que decida no Futures trader. Siga estas indicaciones en su casa: Medicamentos  Siga las indicaciones del mdico en relacin con el uso de medicamentos. Durante el embarazo, hay medicamentos que pueden tomarse y otros que no.  Tome vitaminas prenatales que contengan por lo menos (?g) de cido flico.  Si est estreida, tome un laxante suave, si el mdico lo autoriza. Qu debe comer y beber   Meriel Flavors una dieta equilibrada que incluya gran cantidad de frutas y verduras frescas, cereales integrales, buenas fuentes de protenas como carnes Viola, huevos o tofu, y lcteos descremados. El mdico la ayudar a Production assistant, radio cantidad de peso que puede [place].  No coma carne cruda ni quesos sin cocinar. Estos elementos contienen grmenes que pueden causar defectos congnitos en el beb.  Si no  consume muchos alimentos con calcio, hable con su mdico sobre si debera tomar un suplemento diario de calcio.  Limite el consumo de alimentos con alto contenido de grasas y azcares procesados, como alimentos fritos o dulces.  Para evitar el estreimiento: ? Bebe suficiente lquido para mantener la orina clara o de color amarillo plido. ? Consuma alimentos ricos en fibra, como frutas y verduras frescas, cereales integrales y frijoles. Actividad  Haga ejercicio solamente como se lo haya indicado el mdico. La mayora de las mujeres pueden continuar su rutina de ejercicios durante el [place]. Intente realizar como mnimo de actividad fsica por lo menos 5das a la semana. Deje de hacer ejercicio si experimenta contracciones uterinas.  No levante objetos pesados, use zapatos de tacones bajos y [address].  Puede seguir Calpine Corporation, a menos que el mdico le indique lo contrario. Alivio del dolor y del Dentist  Use un sostn que le brinde buen soporte para prevenir las molestias causadas por la sensibilidad en los pechos.  Dese baos de asiento con agua tibia para Engineer, materials o las molestias causadas por las hemorroides. Use una crema para las hemorroides si el mdico la autoriza.  Descanse con las piernas elevadas si tiene calambres o dolor de cintura.  Si tiene venas varicosas, use medias de descanso. Eleve los pies durante , 3 o 4veces por da. Limite el consumo de sal en su dieta.  Cuidados prenatales  Escriba sus preguntas. Llvelas cuando concurra a las visitas prenatales.  Concurra a todas las visitas prenatales tal como se lo haya indicado el mdico. Esto es importante. Seguridad  Use el cinturn de seguridad en todo momento mientras conduce.  Haga una lista de los nmeros de telfono de Associate Professor, que W. [employer]kley nmeros de telfono de familiares, [place], el hospital y los departamentos de polica y  bomberos. Instrucciones generales  Pdale al mdico que la derive a clases de educacin prenatal en su localidad. Debe comenzar a tomar las clases antes de que empiece el mes6 de [place].  Pida ayuda si tiene necesidades nutricionales o de asesoramiento Academic librarian. El mdico puede aconsejarla o derivarla a especialistas para que la ayuden con diferentes necesidades.  No se d baos de inmersin en agua caliente, baos turcos ni saunas.  No se haga duchas vaginales ni use tampones o toallas higinicas perfumadas.  No mantenga las piernas cruzadas durante [place].  Evite el contacto con las bandejas sanitarias de los gatos y la tierra que estos animales usan. Estos elementos contienen bacterias que pueden causar defectos congnitos al beb y la posible prdida del feto debido a un aborto espontneo o muerte fetal.  Evite fumar, consumir hierbas, beber alcohol y tomar frmacos que no le hayan recetado. Las sustancias qumicas que estos productos contienen pueden afectar la formacin y el desarrollo del beb.  No consuma ningn producto que contenga nicotina o tabaco, como cigarrillos y Administrator, Civil Service. Si necesita ayuda para dejar de fumar, consulte al American Express.  Visite a su dentista si an no lo ha Occupational hygienist. Use un cepillo de dientes blando para higienizarse los dientes y psese el hilo dental con suavidad. Comunquese con un mdico si:  Tiene mareos.  Siente clicos leves, presin en la pelvis o dolor persistente en el abdomen.  Tiene nuseas, vmitos o diarrea persistentes.  Brett Fairy secrecin vaginal con mal olor.  Siente dolor al ConocoPhillips. Solicite ayuda de inmediato si:  Tiene fiebre.  Tiene una prdida de lquido por la vagina.  Tiene sangrado o pequeas prdidas vaginales.  Siente dolor intenso o clicos en el abdomen.  Sube de peso o baja de peso rpidamente.  Tiene dificultad para respirar y siente dolor de  pecho.  Sbitamente se le hinchan mucho el rostro, las [place], los tobillos, los pies o las piernas.  No ha sentido los movimientos del beb durante Georgianne Fick.  Siente un dolor de cabeza intenso que no se alivia al tomar United Parcel.  Nota cambios en la visin. Resumen  El segundo trimestre va desde la semana14 hasta la 27, desde el cuarto hasta el sexto mes. Es tambin una poca en la que el feto se desarrolla rpidamente.  Su organismo atraviesa por muchos cambios durante el [place]. Estos cambios varan de [place] a [place].  Evite fumar, consumir hierbas, beber alcohol y tomar frmacos que no le hayan recetado. Estas sustancias qumicas afectan la formacin y el desarrollo de su beb.  No consuma ningn producto que contenga tabaco, lo que incluye cigarrillos, tabaco de Theatre manager y Administrator, Civil Service. Si necesita ayuda para dejar de fumar, consulte al mdico.  Comunquese con su mdico si tiene preguntas sobre esto. Concurra a todas las visitas prenatales tal como se lo haya indicado el mdico. Esto es importante. Esta informacin no tiene Theme park manager el consejo del mdico. Asegrese de hacerle al mdico cualquier pregunta que tenga. Document Revised: 09/20/2016 Document Reviewed: 09/20/2016 Elsevier Patient  Education  2020 Arvin[employer]itor.        Prueba de tolerancia a la glucosa durante el embarazo Glucose Tolerance Test During Pregnancy Por qu me debo realizar esta prueba? La prueba de tolerancia a la glucosa (PTG) se realiza para Biomedical engineer en que el cuerpo procesa el azcar (glucosa). Esta es una de las diferentes pruebas que se usan para diagnosticar la diabetes que se desarrolla durante el embarazo (diabetes mellitus gestacional). La diabetes gestacional es una forma temporal de diabetes que algunas mujeres desarrollan durante el embarazo. Generalmente, se produce durante el segundo trimestre del embarazo y desaparece despus del parto. Los anlisis  (pruebas de Airline pilot) para la diabetes gestacional por lo general se World [employer] Services Corporation las 24 y 28 semanas de Psychiatrist. Se le podr realizar la prueba PTG despus de realizarse una prueba de deteccin de glucosa de 1 hora si los resultados de esa prueba indican que es posible que tenga diabetes gestacional. Tambin pueden hacerle esta prueba si:  Tiene antecedentes de diabetes gestacional.  Tiene antecedentes de haber parido bebs muy grandes o antecedentes de prdida fetal repetida (muerte fetal).  Tiene signos y sntomas de diabetes, tales como: ? Cambios en la visin. ? Hormigueo o adormecimiento en las manos o los pies. ? Cambios en el hambre, la sed y la miccin que no se explican por Firefighter. Qu se analiza? Esta prueba mide la cantidad de glucosa en la sangre en diferentes momentos durante un perodo de 3horas. Esto indica qu tan bien su cuerpo puede procesar la glucosa. Qu tipo de [place] se toma?  Para esta prueba, se extraen muestras de sangre. Por lo general, para extraerlas, se introduce una aguja en un vaso sanguneo. Cmo debo prepararme para esta prueba?  Durante 3 das antes de la prueba, coma normalmente. Coma muchos alimentos con alto contenido de carbohidratos.  Siga las indicaciones del mdico acerca de lo siguiente: ? Restricciones en la comida o la bebida el da de la prueba. Se le podr pedir que no coma ni beba nada ms que agua (ayuno) desde 8 a 10 horas antes de la prueba. ? Cambiar o suspender los medicamentos que toma habitualmente. Algunos medicamentos pueden interferir en esta prueba. Informe al mdico acerca de lo siguiente:  Todos los Walt Disney, incluidos vitaminas, hierbas, gotas oftlmicas, cremas y [address] de [address].  Cualquier enfermedad de la sangre que tenga.  Cirugas a las que se someti.  Cualquier enfermedad que tenga. Qu ocurre durante la prueba? Primero se le medir la glucemia. Esto se denomina glucemia en  ayunas, ya que usted hizo ayuno antes de la prueba. Luego beber [employer] Products and Chemicals solucin de glucosa que contiene una cierta cantidad de glucosa. Se le medir la glucosa en sangre nuevamente 1, 2 y 3 horas despus de beber la solucin. La realizacin de esta prueba lleva alrededor de 3 horas. Durante ese tiempo Teaching laboratory technician donde se realiza la prueba. Durante el perodo de la prueba:  No coma ni beba nada que no sea la solucin de glucosa.  No haga ejercicios.  No consuma ningn producto que contenga nicotina o tabaco, como cigarrillos y Administrator, Civil Service. Si necesita ayuda para dejar estos productos, consulte a su mdico. El procedimiento de prueba puede variar segn el mdico y Dance movement psychotherapist hospital. Cmo se informan los [place]? Sus resultados se informarn como miligramos de glucosa por decilitro de sangre (mg/dl) o milimoles por litro (mmol/l). El mdico comparar sus resultados con los rangos normales que se  establecieron despus de [facility] a un grupo grande de personas (rangos de referencia). Los rangos de referencia pueden variar entre laboratorios y hospitales. Los rangos de referencia habituales para esta prueba son los siguientes:  En ayunas: menos de 95 a 105mg /dl (5,3 a ).  1 hora despus de beber glucosa: menos de 180 a 190mg /dl (0,6CBJS/E a ).  2 horas despus de beber glucosa: menos de 155 a 165mg /dl (8,6 a 83,1).  3 horas despus de beber glucosa: de 140 a 145mg /dl (7,8 a 51,7OHYW/V). Qu significan los resultados? Los de los rangos de 3,7TGGY/I se , lo que significa que sus niveles de glucosa estn bien controlados. Si dos o ms de sus niveles de glucemia estn altos, es posible que le diagnostiquen diabetes gestacional. Si solo un nivel est alto, el mdico podr sugerir repetir 9,4WNIO/E o hacer otras pruebas para CBS Corporation un diagnstico. Hable con el mdico sobre lo que significan los  [place]. Preguntas para hacerle al mdico Consulte a su mdico o pregunte en el departamento donde se realiza la prueba acerca de lo siguiente:  Cundo estarn disponibles mis resultados?  Cmo obtendr mis resultados?  Cules son mis opciones de tratamiento?  Qu otras pruebas necesito?  Cules son los prximos pasos que debo seguir? Resumen  La prueba de tolerancia a la glucosa (PTG) es una de las diferentes pruebas que se usan para diagnosticar la diabetes que se desarrolla durante el embarazo (diabetes mellitus gestacional). La diabetes gestacional es una forma temporal de diabetes que algunas mujeres desarrollan durante el embarazo.  Se le podr realizar la prueba PTG despus de realizarse una prueba de deteccin de glucosa de 1 hora si los resultados de esa prueba indican que es posible que tenga diabetes gestacional. Tambin se le podr realizar esta prueba si tiene algn sntoma o factor de riesgo de diabetes gestacional.  Hable con el mdico sobre lo que significan los [place]. Esta informacin no tiene Ambulance person el consejo del mdico. Asegrese de hacerle al mdico cualquier pregunta que tenga. Document Revised: 03/08/2017 Document Reviewed: 03/08/2017 Elsevier Patient Education  2020 [place].        [place] y COVID-19 Pregnancy and COVID-19 La enfermedad por coronavirus, que tambin se conoce como COVID-19, es una infeccin de los pulmones y las vas respiratorias. En este momento, no se sabe con certeza si el embarazo hace ms probable que una persona contraiga COVID-19, o qu efectos puede tener la infeccin en un beb en gestacin. No obstante, el embarazo provoca cambios en el corazn, los pulmones y el sistema del organismo que combate las enfermedades (sistema inmunitario). Algunos de 03/10/2017 que sea ms probable que se enferme y tenga una enfermedad ms grave. Por lo tanto, es importante que tome precauciones para protegerse y  03/10/2017 a su beb en gestacin. Se han realizado estudios que demuestran que la obesidad y la diabetes pueden ponerla en mayor riesgo de contraer enfermedades graves. Si est embarazada y es obesa o tiene diabetes, debe tomar precauciones adicionales para protegerse del virus. Trabaje con su equipo de atencin mdica para elaborar un plan para protegerse de todas las infecciones, incluido el COVID-19. Esta es una forma de mantenerse sana durante el embarazo y de ArvinMeritor sano a su beb tambin. [place] modo me afecta? Si se contagia con COVID-19, existe el riesgo de que pueda:  Continental Airlines una enfermedad respiratoria que puede ocasionar neumona.  Dar a luz al beb antes de las 37semanas de  embarazo (nacimiento prematuro). Si usted tiene o Surveyor, miningpuede tener COVID-19, el mdico puede recomendarle precauciones especiales relacionadas con Firefighterel embarazo. Esto puede afectar lo siguiente:  La forma en que recibe la atencin antes del parto (cuidado prenatal). Puede cambiar la forma en que realiza sus visitas al American Expressmdico. Es posible que los estudios y las ecografas deban hacerse de [place] Ranchotra manera.  La forma en que recibe atencin durante el Millertontrabajo de parto y Stockton Bendel parto. Esto puede afectar el plan de parto, incluso quines podrn estar con usted durante el Humbirdtrabajo de Craig Beachparto y [place] parto.  La forma en que recibe atencin despus de que d a luz al beb (atencin posparto). Es posible que deba quedarse ms Duke Energytiempo en el hospital y en una habitacin especial.  La forma en que alimenta al beb despus de que nazca. El Psychiatristembarazo puede ser un momento muy estresante debido a los cambios que suceden en el cuerpo y la preparacin que implica convertirse en Somersmadre. Adems, puede sentirse especialmente temerosa, ansiosa o estresada debido al COVID-19 y cmo la est afectando. De qu modo lo afecta al beb? Se desconoce si una madre puede transmitir el virus a su beb en gestacin. Si se contagia con COVID-19, existen los siguientes  riesgos:  Que el virus causante de COVID-19 pueda transmitirse al beb.  Que tenga el beb tenga un nacimiento prematuro. El beb puede necesitar ms atencin mdica si esto ocurre. Qu puedo hacer para disminuir el riesgo?  No hay ninguna vacuna que ayude a prevenir el COVID-19. Sin embargo, hay medidas que puede tomar para protegerse y Conservator, museum/galleryproteger a Economistotras personas de este virus. Limpieza e higiene personal  Lavarse las manos frecuentemente con agua y jabn durante al menos 20segundos. Usar desinfectante para manos con alcohol si no dispone de [place] y Belarusjabn.  Evitar tocarse la boca, la cara, los ojos o la Lambs Grovenariz.  Limpiar y Environmental education officerdesinfectar los objetos y las superficies que se tocan con frecuencia todos [place]los das. Estos incluyen: ? Encimeras y [place]as. ? Picaportes e interruptores de luz. ? Lavabos, fregaderos y grifos. ? Aparatos electrnicos tales como telfonos, controles remotos, teclados, computadoras y tabletas. Mantenerse alejada de otras personas  Mantenerse alejada de las personas que estn enfermas, si es posible.  Evitar las reuniones sociales y los viajes.  Permanecer en su casa todo lo que sea posible. Siga estas instrucciones: Lactancia materna Se desconoce si el virus causante del COVID-19 puede transmitirse a travs de la Colgate Palmoliveleche materna al beb. Debe hacer un plan para alimentar al beb con su familia y su equipo de atencin mdica. Si usted tiene o Surveyor, miningpuede tener COVID-19, el mdico puede recomendarle que tome precauciones mientras Spenceramamanta, por ejemplo:  Lavarse las manos antes de alimentar al beb.  Usar Henry Scheinuna mascarilla mientras alimenta al beb.  Extraerse la leche materna manualmente o con sacaleche para Corporate treasureralimentar al beb. De ser posible, pdale a alguien de su hogar que no est enfermo que alimente al beb con la Mirantleche que usted se extrae. ? Lvese las manos antes de tocar las piezas del sacaleche. ? Verdie DrownLave y desinfecte todas las piezas del sacaleche despus de extraer  Erdaleche. Siga las instrucciones del fabricante para limpiar y Environmental education officerdesinfectar todas las piezas del Midwifesacaleche. Instrucciones generales  Si cree que tiene una infeccin por COVID-19, comunquese de inmediato con su mdico. Informe a su mdico que cree que puede tener una infeccin por COVID-19.  Siga las instrucciones del mdico acerca de cmo tomar los medicamentos. Es posible que no sea  seguro tomar algunos Chief Technology Officer.  Cbrase la boca y la nariz usando una mascarilla u otra cubierta de tela sobre el rostro cuando salga a lugares pblicos.  Busque maneras de Charity fundraiser. Estos incluyen: ? Practicar tcnicas de relajacin, como meditacin y respiracin profunda. ? Practicar actividad fsica con regularidad. La Harley-Davidson de las mujeres pueden continuar su rutina de ejercicios durante el [place]. Pregntele al mdico qu actividades son seguras para usted. ? Buscar apoyo de familiares, amigos o recursos espirituales. Si no pueden estar juntos en persona, igualmente puede conectarse por llamadas telefnicas, mensajes de texto, videollamadas o mensajes en lnea. ? Dedicar tiempo a Education officer, environmental actividades relajantes que disfrute, como escuchar msica o leer un buen libro.  Pida ayuda si tiene necesidades nutricionales o de asesoramiento Academic librarian. El mdico puede aconsejarla o recomendarle recursos o derivarla a especialistas para que la ayuden con diferentes necesidades.  Concurra a todas las visitas de [address] se lo haya indicado el mdico. Esto es importante. Dnde buscar ms informacin Centers for Disease Control and Prevention Insurance claims handler) (Centros para el Control y la Prevencin de Event organiser): AffordableShare.com.br World Health Organization (Organizacin Mundial de la [place]) (OMS): PokerPortraits.es Paediatric nurse (ACOG) (Colegio  Estadounidense de Obstetras y Scientific laboratory technician): BuyDucts.dk Preguntas para hacerle a su equipo de atencin mdica  Qu debo hacer si tengo sntomas de COVID-19?  Cmo afectar el COVID-19 mis visitas de cuidados prenatales, los estudios y [place], mi [place] de parto y [place], y la atencin posparto?  Debo planificar amamantar a mi beb?  Dnde puedo encontrar recursos de salud mental?  Dnde puedo encontrar apoyo si tengo preocupaciones econmicas? Comunquese con un mdico si:  Tiene signos y sntomas de infeccin, tales como fiebre y tos. Informe al equipo de atencin mdica que cree que puede tener una infeccin por el COVID-19.  Tiene emociones fuertes, como tristeza o ansiedad.  Se siente insegura en su casa y necesita ayuda para encontrar un lugar seguro para vivir.  Tiene secrecin vaginal acuosa o con sangre o sangrado vaginal. Solicite ayuda inmediatamente si:  Tiene signos o sntomas de trabajo de parto antes de las 37 semanas de [place]. Esto incluye lo siguiente: ? Contracciones separadas unas de otras por intervalos de 5 minutos o menos, o que aumentan en frecuencia, intensidad o duracin. ? Dolor repentino y fuerte en el abdomen o en la parte inferior de la espalda. ? Presenta un chorro o goteo de lquido proveniente de la vagina.  Tiene signos de enfermedad ms grave, por ejemplo: ? Tiene dificultad para respirar. ? Siente dolor en el pecho. ? Tiene fiebre de ms de 102F (39C) o ms que no desaparece. ? No puede beber lquidos sin vomitar. ? Se siente muy dbil o se desmaya. Estos sntomas pueden representar un problema grave que constituye Radio broadcast assistant. No espere a ver si los sntomas desaparecen. Solicite atencin mdica de inmediato. Comunquese con el servicio de emergencias de su localidad (911 en los [place]). No conduzca por sus propios medios Dillard's. Resumen  La enfermedad por coronavirus, que tambin se conoce como COVID-19, es una infeccin de los pulmones y las vas respiratorias. En este momento, no se sabe con certeza si el embarazo hace que sea ms susceptible al COVID-19 y Risk manager se conoce qu efectos puede tener en los bebs en gestacin.  Es importante que tome precauciones para protegerse y Conservator, museum/gallery a su beb en gestacin. Esto incluye lavarse las manos con frecuencia,  evitar tocarse la boca, el rostro, los ojos y Architectural technologist, Automotive engineer las reuniones sociales y los viajes, y Research scientist (life sciences) alejada de las personas que estn enfermas.  Si cree que tiene una infeccin por COVID-19, comunquese de inmediato con su mdico. Informe a su mdico que cree que puede tener una infeccin por COVID-19.  Si tiene o Colgate COVID-19, el mdico puede recomendarle precauciones especiales durante el [place], el [place] de parto y [place], y despus de que nazca el beb. Esta informacin no tiene Theme park manager el consejo del mdico. Asegrese de hacerle al mdico cualquier pregunta que tenga. Document Revised: 03/15/2019 Document Reviewed: 12/27/2018 Elsevier Patient Education  2020 Elsevier Inc.       Las medicinas seguras para tomar durante el embarazo  Safe Medications in Pregnancy  Acn:  Benzoyl Peroxide (Perxido de benzolo)  Salicylic Acid (cido saliclico)  Dolor de espalda/Dolor de cabeza:  Tylenol: 2 pastillas de concentracin regular cada 4 horas O 2 pastillas de concentracin fuerte cada 6 horas  Resfriados/Tos/Alergias:  Benadryl (sin alcohol) 25 mg cada 6 horas segn lo necesite Breath Right strips (Tiras para respirar correctamente)  Claritin  Cepacol (pastillas de chupar para la garganta)  Chloraseptic (aerosol para la garganta)  Cold-Eeze- hasta tres veces por da  Cough drops (pastillas de chupar para la tos, sin alcohol)  Flonase (con receta mdica solamente)  Guaifenesin  Mucinex  Robitussin DM  (simple solamente, sin alcohol)  Saline nasal spray/drops (Aerosol nasal salino/gotas) Sudafed (pseudoephedrine) y  Actifed * utilizar slo despus de 12 semanas de gestacin y si no tiene la presin arterial alta.  Tylenol Vicks  VapoRub  Zinc lozenges (pastillas para la garganta)  Zyrtec  Estreimiento:  Colace  Ducolax (supositorios)  Fleet enema (lavado intestinal rectal)  Glycerin (supositorios)  Metamucil  Milk of magnesia (leche de magnesia)  Miralax  Senokot  Smooth Move (t)  Diarrea:  Kaopectate Imodium A-D  *NO tome Pepto-Bismol  Hemorroides:  Anusol  Anusol HC  Preparation H  Tucks  Indigestin:  Tums  Maalox  Mylanta  Zantac  Pepcid  Insomnia:  Benadryl (sin alcohol) 25mg  cada 6 horas segn lo necesite  Tylenol PM  Unisom, no Gelcaps  Calambres en las piernas:  Tums  MagGel Nuseas/Vmitos:  Bonine  Dramamine  Emetrol  Ginger (extracto)  Sea-Bands  Meclizine  Medicina para las nuseas que puede tomar durante el embarazo: Unisom (doxylamine succinate, pastillas de 25 mg) Tome una pastilla al da al [place]. Si los sntomas no estn adecuadamente controlados, la dosis puede aumentarse hasta una dosis mxima recomendada de [employer] al da (1/2 pastilla por la [place], 1/2 pastilla a media tarde y Neomia Dear pastilla al [place]). Pastillas de Vitamina B6 de 100mg . Tome Con[employer] Foods veces al da (hasta 200 mg por da).  Erupciones en la piel:  Productos de Aveeno  Benadryl cream (crema o una dosis de 25mg  cada 6 horas segn lo necesite)  Calamine Lotion (locin)  1% cortisone cream (crema de cortisona de 1%)  nfeccin vaginal por hongos (candidiasis):  Gyne-lotrimin 7  Monistat 7   **Si est tomando varias medicinas, por favor revise las etiquetas para Art gallery manager los mismos ingredientes [place]. **Tome la medicina segn lo indicado en la etiqueta. **No tome ms de 4000 mg de Tylenol en 24 horas. **No tome medicinas que contengan aspirina o  ibuprofeno.           Informacin sobre parto y Aleen Campi de parto prematuros (Preterm Labor and Birth Information)  La duracin de un embarazo normal es de 39 a 41semanas. Se llama trabajo de parto prematuro cuando se inicia antes de las 37semanas de [place]. CULES SON LOS FACTORES DE RIESGO DEL TRABAJO DE PARTO PREMATURO? Existen mayores probabilidades de trabajo de parto prematuro en mujeres con las siguientes caractersticas:  Tienen ciertas infecciones durante el embarazo, como infeccin de vejiga, infeccin de transmisin sexual o infeccin en el tero (corioamnionitis).  Tienen el cuello del tero ms corto que lo normal.  Tuvieron trabajo de parto prematuro anteriormente.  Se sometieron a una ciruga en el cuello del tero.  Son menores de 17aos o [address] de 35aos de edad.  Son afroamericanas.  Estn embarazadas de [employer] o de varios bebs (gestacin mltiple).  Consumen drogas o fuman mientras estn embarazadas.  No aumentan de peso lo suficiente durante el [employer].  Se embarazan poco despus de Unisys Corporation. CULES SON LOS SNTOMAS DEL TRABAJO DE PARTO PREMATURO? Los sntomas del trabajo de parto prematuro incluyen lo siguiente:  Educational psychologist similares a los que ocurren durante el perodo menstrual. Los calambres pueden presentarse con diarrea.  Dolor en el abdomen o en la parte inferior de la espalda.  Contracciones uterinas regulares que se pueden sentir como una presin en el abdomen.  Una sensacin de mayor presin en la pelvis.  Aumento de la secrecin de moco acuoso o sanguinolento en la vagina.  Rotura de bolsa (rotura de saco amnitico). POR QU ES IMPORTANTE RECONOCER LOS SIGNOS DEL TRABAJO DE PARTO PREMATURO? Es Public librarian los signos del trabajo de parto prematuro porque los bebs que nacen de forma prematura pueden no estar completamente desarrollados. Por lo tanto, pueden correr mayor riesgo de lo  siguiente:  Problemas cardacos y pulmonares a Air cabin crew (crnicos).  Inmediatamente despus del parto, dificultades para regular los sistemas corporales, que incluyen glucemia, temperatura corporal, frecuencia cardaca y frecuencia respiratoria.  Hemorragia cerebral.  Parlisis cerebral.  Dificultades en el aprendizaje.  Muerte. Estos riesgos son [employer] para bebs que nacen antes de las 34semanas de [place]. CMO SE TRATA EL TRABAJO DE PARTO PREMATURO? El tratamiento depende del tiempo de su [place], su afeccin y la salud de su beb. Puede incluir lo siguiente:  Tener un punto (sutura) en el cuello del tero para evitar que este se abra demasiado pronto (cerclaje).  Tomar medicamentos, por ejemplo: ? Medicamentos hormonales. Estos se pueden administrar de forma temprana en el embarazo para ayudar a Visual merchandiser. ? Medicamentos para TEFL teacher las contracciones. ? Medicamentos que ayudan a [employer] del beb. Estos se pueden recetar si el riesgo de parto es [place]. ? Medicamentos para evitar que el beb desarrolle parlisis cerebral. Si el trabajo de parto de inicia antes de las 34semanas de [place], es posible que deba hospitalizarse. QU DEBO HACER SI CREO QUE ESTOY EN TRABAJO DE PARTO PREMATURO? Si cree que est iniciando trabajo de parto prematuro, llame al mdico de inmediato. CMO PUEDO EVITAR EL TRABAJO DE PARTO PREMATURO EN FUTUROS EMBARAZOS? Para aumentar las probabilidades de tener un embarazo a trmino, Financial planner en cuenta lo siguiente:  No consuma ningn producto que contenga tabaco, lo que incluye cigarrillos, tabaco de Theatre manager y Administrator, Civil Service. Si necesita ayuda para dejar de fumar, consulte al mdico.  No consuma drogas ni medicamentos que no sean recetados Academic librarian.  Hable con el mdico antes de tomar suplementos a base de hierbas aunque los [employer].  Asegrese de llegar a un peso Regulatory affairs officer.  Tenga cuidado con las infecciones. Si cree que puede tener una infeccin, consulte al mdico para que la revisen.  Asegrese de informarle al mdico si ha tenido trabajo de parto prematuro antes. Esta informacin no tiene Theme park manager el consejo del mdico. Asegrese de hacerle al mdico cualquier pregunta que tenga. Document Revised: 01/15/2016 Document Reviewed: 10/01/2015 Elsevier Patient Education  2020 ArvinMeritor.

## 2020-01-24 NOTE — Progress Notes (Signed)
Pt does not have access to BP Cuff, will take when she gets home.Pt also has question about Covid 19. Pt has Spanish Interpreter in My Chart with her.

## 2020-02-05 ENCOUNTER — Encounter: Payer: Self-pay | Admitting: *Deleted

## 2020-02-18 ENCOUNTER — Other Ambulatory Visit: Payer: Self-pay | Admitting: General Practice

## 2020-02-18 DIAGNOSIS — Z3493 Encounter for supervision of normal pregnancy, unspecified, third trimester: Secondary | ICD-10-CM

## 2020-02-21 ENCOUNTER — Ambulatory Visit (INDEPENDENT_AMBULATORY_CARE_PROVIDER_SITE_OTHER): Payer: Self-pay | Admitting: Obstetrics and Gynecology

## 2020-02-21 ENCOUNTER — Other Ambulatory Visit: Payer: Self-pay

## 2020-02-21 VITALS — BP 117/62 | HR 85 | Wt 194.0 lb

## 2020-02-21 DIAGNOSIS — Z7189 Other specified counseling: Secondary | ICD-10-CM

## 2020-02-21 DIAGNOSIS — Z23 Encounter for immunization: Secondary | ICD-10-CM

## 2020-02-21 DIAGNOSIS — Z3A27 27 weeks gestation of pregnancy: Secondary | ICD-10-CM

## 2020-02-21 DIAGNOSIS — Z3493 Encounter for supervision of normal pregnancy, unspecified, third trimester: Secondary | ICD-10-CM

## 2020-02-21 NOTE — Patient Instructions (Addendum)
https://www.cdc.gov/vaccines/hcp/vis/vis-statements/tdap.pdf">  [place] Tdap (ttanos, difteria y [place]): lo que debe saber Tdap (Tetanus, Diphtheria, Pertussis) Vaccine: What You Need to Know 1. Por qu vacunarse? La vacuna Tdap puede prevenir el ttanos, la difteria y la tos [place]. La difteria y la tos Benetta Spar se [place] de persona a Social worker. El ttanos ingresa al organismo a travs de cortes o heridas.  El [place] (T) provoca rigidez dolorosa en los msculos. El ttanos puede causar graves problemas de [place], como no poder abrir la boca, Warehouse manager dificultad para tragar y Industrial/product designer, o la muerte.  La DIFTERIA (D) puede causar dificultad para respirar, insuficiencia cardaca, parlisis o muerte.  La TOS FERINA (aP) tambin conocida como "tos convulsa" puede causar tos violenta e incontrolable lo que hace difcil respirar, comer o beber. La tos ferina puede ser muy grave en los bebs y en los nios pequeos, y causar neumona, convulsiones, dao cerebral o la [place]. En adolescentes y [address], puede causar prdida de [place], prdida del control de la vejiga, [place] y fracturas de Forensic psychologist al toser de Wellsite geologist intensa. 2. Madilyn Fireman Tdap La vacuna Tdap es solo para nios de 7 aos en adelante, adolescentes y [place].  Los adolescentes debe recibir una dosis nica de la vacuna Tdap, preferentemente a los 11 o 12 aos. Las mujeres embarazadas deben recibir una dosis de la vacuna Tdap en cada embarazo para proteger al recin nacido de la tos [place]. Los bebs tienen mayor riesgo de sufrir complicaciones graves y potencialmente mortales debido a la tos [place]. Los adultos que nunca recibieron la vacuna Tdap deben recibir una dosis. [place], los adultos deben recibir una dosis de refuerzo cada 10 aos, o antes si la persona sufre una [place] o una herida grave y [place]. Las dosis de refuerzo pueden ser de la vacuna Tdap o la Td (una vacuna diferente que protege contra el ttanos y la difteria, pero no contra  la tos [place]). La vacuna Tdap puede ser administrada al mismo tiempo que otras vacunas. 3. Hable con el mdico Comunquese con la persona que le coloca las vacunas si la persona que la recibe:  Ha tenido una reaccin alrgica despus de Neomia Dear dosis anterior de cualquier vacuna contra el ttanos, la difteria o la tos [place], o cualquier alergia grave, potencialmente mortal.  Ha tenido un coma, disminucin del nivel de la conciencia o convulsiones prolongadas dentro de los [address] posteriores a una dosis anterior de cualquier vacuna contra la tos ferina (DTP, DTaP o Tdap).  Tiene convulsiones u otro problema del sistema nervioso.  Alguna vez tuvo sndrome de Guillain-Barr (tambin llamado SGB).  Ha tenido dolor intenso o hinchazn despus de una dosis anterior de cualquier vacuna contra el ttanos o la difteria. En algunos casos, es posible que el mdico decida posponer la aplicacin de la vacuna Tdap para una visita en el futuro.  Las personas que sufren trastornos menores, como un resfro, pueden vacunarse. Las personas que tienen enfermedades moderadas o graves generalmente deben esperar hasta recuperarse para poder recibir la vacuna Tdap.  Su mdico puede darle ms informacin. 4. Riesgos de [place] reaccin a la vacuna  Despus de recibir la vacuna Tdap a veces se puede Surveyor, mining, enrojecimiento o Paramedic donde se aplic la inyeccin, fiebre leve, dolor de cabeza, sensacin de cansancio y nuseas, vmitos, diarrea o dolor de [place]. Las personas a veces se desmayan despus de procedimientos mdicos, incluida la vacunacin. Informe al mdico si se siente mareado, tiene cambios en la visin o zumbidos  en los odos.  Al igual que con cualquier Automatic Datamedicamento, existe una probabilidad muy remota de que una vacuna cause una reaccin alrgica grave, otra lesin grave o la muerte. 5. Qu pasa si se presenta un problema grave? Podra producirse una reaccin alrgica despus de que la  persona vacunada abandone la clnica. Si observa signos de Runner, broadcasting/film/videouna reaccin alrgica grave (ronchas, hinchazn de la cara y la garganta, dificultad para respirar, latidos cardacos acelerados, mareos o debilidad), llame al 9-1-1 y lleve a la persona al hospital ms cercano. Si se presentan otros signos que le preocupan, comunquese con su mdico.  Las reacciones adversas deben informarse al Sistema de Informe de Eventos Adversos de Administrator, artsVacunas (Vaccine Adverse Event Reporting System, VAERS). Por lo general, el mdico presenta este informe o puede hacerlo usted mismo. Visite el sitio web del VAERS en www.vaers.LAgents.nohhs.gov o llame al [phone]. El VAERS es solo para Biomedical engineerinformar reacciones; su personal no proporciona asesoramiento mdico. 6. Programa Nacional de Compensacin de Daos por American Electric PowerVacunas El SunTrustPrograma Nacional de Compensacin de Daos por Administrator, artsVacunas (National Vaccine Injury Kohl'sCompensation Program, Cabin crewVICP) es un programa federal que fue creado para Patent examinercompensar a las personas que puedan haber sufrido daos al recibir ciertas vacunas. Visite el sitio web del VICP en SpiritualWord.atwww.hrsa.gov/vaccinecompensation o llame al 1-707-421-1997 para obtener ms informacin acerca del programa y de cmo presentar un reclamo. Hay un lmite de tiempo para presentar un reclamo de compensacin. 7. Cmo puedo obtener ms informacin?  Pregntele a su mdico.  Comunquese con el servicio de salud de su localidad o su estado.  Comunquese con Building control surveyorlos Centers for Micron TechnologyDisease Control and Prevention, CDC (Centros para el Control y la Prevencin de Z[place]villeEnfermedades): ? Llame al [phone] (1-800-CDC-INFO) o ? Visite el sitio Environmental managerweb de los CDC en PicCapture.uywww.cdc.gov/vaccines Declaracin de informacin de la vacuna Tdap (ttanos, difteria y Kalman Shantos ferina) (05/27/2018) Esta informacin no tiene Theme park managercomo fin reemplazar el consejo del mdico. Asegrese de hacerle al mdico cualquier pregunta que tenga. Document Revised: 09/19/2018 Document Reviewed: 09/19/2018 Elsevier Patient  Education  2020 ArvinMeritorElsevier Inc.   Blacklick EstatesVacuna antigripal (de virus vivos, intranasal): lo que debe saber Influenza (Flu) Vaccine (Live, Intranasal): What You Need to Know 1. Por qu vacunarse? La vacuna contra la gripe puede prevenir la gripe. La gripe es una enfermedad contagiosa que se disemina en los Estados [place] cada ao, por lo general, Eusebio Meentre octubre y [place]mayo. Cualquier persona puede contraer gripe, pero es ms peligrosa para Runner, broadcasting/film/videociertas personas. Los bebs y los nios pequeos, los L-3 Communicationsmayores de 65aos, las Websterembarazadas, as Avon Productscomo las personas que tienen ciertas enfermedades o cuyo sistema inmunitario est debilitado corren un riesgo mayor de tener complicaciones debido a la gripe. La neumona, la bronquitis, las infecciones de los senos paranasales y las infecciones de odos son ejemplos de complicaciones relacionadas con la gripe. Si tiene una afeccin, por ejemplo, enfermedad cardaca, cncer o diabetes, la gripe puede empeorarla. La gripe puede causar fiebre y escalofros, dolor de garganta, dolores musculares, fatiga, tos, dolor de Turkmenistancabeza y secrecin o congestin nasal. Algunas personas pueden tener vmitos y Barnett Hatterdiarrea, [place]unque esto es ms frecuente en los nios que en los adultos. Cada ao, miles de Foot Lockerpersonas mueren en los Estados [place]idos debido a la gripe, y muchas ms deben ser hospitalizadas. Cada ao, la vacuna antigripal previene millones de enfermedades y evita visitas al mdico relacionadas con la gripe. 2. Madilyn FiremanVacuna contra la gripe de virus vivos atenuados Los CDC (Centros para Air traffic controllerel Control y la Prevencin de StewardsonEnfermedades) recomiendan que todas las personas a Glass blower/designerpartir  de los 6 meses de edad se vacunen cada temporada de gripe. Es posible que los nios de a 8aos deban recibir 2 dosis durante la misma temporada de gripe. Todas las dems personas tienen que aplicarse 1 sola dosis cada temporada de gripe. La vacuna contra la gripe de virus vivos atenuados (llamada LAIV) es un aerosol nasal que puede  aplicarse a las personas que tienen [place] 2 y 49aos de [place], excepto a las [place]. La vacuna comienza a surtir Librarian, academic 2semanas despus de su aplicacin. Hay muchos virus de la gripe, y Estate agent. Cada ao, se elabora una nueva vacuna antigripal para brindar proteccin contra tres o cuatro virus que probablemente causen la enfermedad en la siguiente temporada de gripe. Incluso si la vacuna no es especfica para esos virus, aun as puede brindar cierta proteccin. La vacuna contra la gripe no causa gripe. La vacuna contra la gripe puede administrarse al mismo tiempo que otras vacunas. 3. Hable con el mdico Comunquese con la persona que le coloca las vacunas si la persona que la recibe:  Es menor de 2 aos de edad o mayor de 49 aos de edad.  Est embarazada.  Ha tenido una reaccin alrgica despus de Neomia Dear dosis previa de la vacuna contra la gripe o tiene alguna alergia grave, potencialmente mortal.  Es un nio o un adolescente de 2 a 17 aos de edad que est tomando aspirina o productos que contienen aspirina.  Tiene el sistema inmunitario debilitado.  Tiene entre 2 y 4 aos de edad y tiene asma o antecedentes de sibilancias en los ltimos 12 meses.  Tom medicamentos antivirales contra la gripe durante las ltimas 48horas.  Cuida a personas gravemente inmunodeprimidas que necesitan un entorno protegido.  Es mayor de 5 aos de edad y tiene asma.  Tiene otras afecciones subyacentes que pueden poner a las Theatre manager riesgo de sufrir complicaciones graves relacionadas con la gripe (como enfermedad pulmonar, enfermedad cardaca, enfermedad renal, trastornos renales o hepticos, trastornos neurolgicos, neuromusculares o metablicos).  Tuvo el sndrome de Pension scheme manager dentro de las 6 semanas posteriores a una dosis previa de la vacuna contra la gripe. En algunos casos, es posible que el mdico decida posponer la aplicacin de la vacuna  contra la gripe para una visita en el futuro. Para algunos pacientes, un tipo diferente de vacuna contra la gripe (vacuna contra la gripe inactivada o recombinante) podra ser ms apropiada que la vacuna contra la gripe de virus vivos atenuados. Las personas que sufren trastornos menores, como un resfro, pueden vacunarse. Las [employer] Lilly and Company tienen enfermedades moderadas o graves generalmente deben esperar hasta recuperarse para poder recibir la vacuna contra la gripe. Su mdico puede darle ms informacin. 4. Riesgos de [place] reaccin a la vacuna  Despus de recibir la vacuna contra la gripe de virus vivos atenuados, es posible tener secrecin nasal o congestin nasal, sibilancias y Engineer, mining de [place].  Los vmitos, dolores musculares, [place], dolor de [place] y tos son otros posibles efectos secundarios. Si estos problemas ocurren, generalmente comienzan poco despus de la vacunacin y son leves y [place]. Al igual que con cualquier Automatic Data, existe una probabilidad muy remota de que una vacuna cause una reaccin alrgica grave, otra lesin grave o la muerte. 5. Qu pasa si se presenta un problema grave? Podra producirse una reaccin alrgica despus de que la persona vacunada abandone la clnica. Si observa signos de Runner, broadcasting/film/video grave (ronchas, hinchazn de la cara y la garganta, dificultad para respirar, latidos  cardacos acelerados, mareos o debilidad), llame al 9-1-1 y lleve a la persona al hospital ms cercano. Si se presentan otros signos que le preocupan, comunquese con su mdico. Las reacciones adversas deben informarse al Sistema de Informe de Eventos Adversos de Administrator, arts (Vaccine Adverse Event Reporting System, VAERS). Por lo general, el mdico presenta este informe o puede hacerlo usted mismo. Visite el sitio web del VAERS en www.vaers.LAgents.no o llame al ([phone]) [phone].El VAERS es solo para Biomedical engineer; su personal no proporciona asesoramiento mdico. 6. Programa Nacional  de Compensacin de Daos por American Electric Power El SunTrust de Compensacin de Daos por Administrator, arts (National Vaccine Injury Kohl's, Cabin crew) es un programa federal que fue creado para Patent examiner a las personas que puedan haber sufrido daos al recibir ciertas vacunas. Visite el sitio web del VICP en SpiritualWord.at o llame al 1-(304)[phone] para obtener ms informacin acerca del programa y de cmo presentar un reclamo. Hay un lmite de tiempo para presentar un reclamo de compensacin. 7. Cmo puedo obtener ms informacin?  Pregntele al mdico.  Comunquese con el servicio de salud de su localidad o su [place].  Comunquese con los Centros para el Control y la Prevencin de Child psychotherapist for Disease Control and Prevention, CDC): ? Llame al ([phone] (1-800-CDC-INFO) o ? Visite el sitio Environmental manager en [email or web address].cy Declaracin de informacin (provisional) sobre la vacuna contra la gripe de virus vivos atenuados (15/12/2017) Esta informacin no tiene Theme park manager el consejo del mdico. Asegrese de hacerle al mdico cualquier pregunta que tenga. Document Revised: 01/17/2018 Document Reviewed: 01/17/2018 Elsevier Patient Education  2020 ArvinMeritor.

## 2020-02-21 NOTE — Progress Notes (Signed)
   PRENATAL VISIT NOTE  Subjective:  Paula Gamble is a 24 y.o. [phone] at [redacted]w[redacted]d being seen today for ongoing prenatal care.  She is currently monitored for the following issues for this low-risk pregnancy and has Supervision of low-risk pregnancy; Language barrier; History of COVID-19; History of pyelonephritis during pregnancy; History of C-section; Obesity during pregnancy; GBS (group B streptococcus) UTI complicating pregnancy; and Anemia in pregnancy on their problem list.  Patient reports no complaints.  Contractions: Not present. Vag. Bleeding: None.  Movement: Present. Denies leaking of fluid.   The following portions of the patient's history were reviewed and updated as appropriate: allergies, current medications, past family history, past medical history, past social history, past surgical history and problem list.   Objective:   Vitals:   02/21/20 1040  BP: 117/62  Pulse: 85  Weight: 194 lb (88 kg)    Fetal Status: Fetal Heart Rate (bpm): 146 Fundal Height: 28 cm Movement: Present     General:  Alert, oriented and cooperative. Patient is in no acute distress.  Skin: Skin is warm and dry. No rash noted.   Cardiovascular: Normal heart rate noted  Respiratory: Normal respiratory effort, no problems with respiration noted  Abdomen: Soft, gravid, appropriate for gestational age.  Pain/Pressure: Present     Pelvic: Cervical exam deferred        Extremities: Normal range of motion.  Edema: None  Mental Status: Normal mood and affect. Normal behavior. Normal judgment and thought content.   Assessment and Plan:  Pregnancy: G4P1021 at [redacted]w[redacted]d 1. Encounter for supervision of low-risk pregnancy in third trimester  - Tdap vaccine greater than or equal to 7yo IM - Flu Vaccine QUAD 36+ mos IM - 2 hour GTT today.   COVID-19 Vaccine Counseling: The patient was counseled on the potential benefits and lack of known risks of COVID vaccination, during pregnancy and breastfeeding,  during today's visit. The patient's questions and concerns were addressed today, including safety of the vaccination and potential side effects as they have been published by ACOG and SMFM. The patient has been informed that there have not been any documented vaccine related injuries, deaths or birth defects to infant or mom after receiving the COVID-19 vaccine to date. The patient has been made aware that although she is not at increased risk of contracting COVID-19 during pregnancy, she is at increased risk of developing severe disease and complications if she contracts COVID-19 while pregnant. All patient questions were addressed during our visit today. The patient is still unsure of her decision for vaccination.    bill [id number] with 25 modifier for in-person and CR modifier   Preterm labor symptoms and general obstetric precautions including but not limited to vaginal bleeding, contractions, leaking of fluid and fetal movement were reviewed in detail with the patient. Please refer to After Visit Summary for other counseling recommendations.   Return in about 2 weeks (around 03/06/2020) for please scheduled with MD to discuss TOLAC .  No future appointments.  Venia Carbon, NP

## 2020-02-22 ENCOUNTER — Inpatient Hospital Stay (HOSPITAL_COMMUNITY): Payer: Self-pay

## 2020-02-22 ENCOUNTER — Encounter (HOSPITAL_COMMUNITY): Payer: Self-pay | Admitting: Obstetrics & Gynecology

## 2020-02-22 ENCOUNTER — Inpatient Hospital Stay (HOSPITAL_COMMUNITY)
Admission: AD | Admit: 2020-02-22 | Discharge: 2020-02-22 | Disposition: A | Discharge status: Home / Self Care | Payer: Self-pay | Attending: Obstetrics & Gynecology | Admitting: Obstetrics & Gynecology

## 2020-02-22 DIAGNOSIS — R1013 Epigastric pain: Secondary | ICD-10-CM

## 2020-02-22 DIAGNOSIS — Z3493 Encounter for supervision of normal pregnancy, unspecified, third trimester: Secondary | ICD-10-CM

## 2020-02-22 DIAGNOSIS — R1011 Right upper quadrant pain: Secondary | ICD-10-CM

## 2020-02-22 DIAGNOSIS — O99612 Diseases of the digestive system complicating pregnancy, second trimester: Secondary | ICD-10-CM | POA: Insufficient documentation

## 2020-02-22 DIAGNOSIS — Z789 Other specified health status: Secondary | ICD-10-CM

## 2020-02-22 DIAGNOSIS — O99019 Anemia complicating pregnancy, unspecified trimester: Secondary | ICD-10-CM

## 2020-02-22 DIAGNOSIS — O99210 Obesity complicating pregnancy, unspecified trimester: Secondary | ICD-10-CM

## 2020-02-22 DIAGNOSIS — Z87440 Personal history of urinary (tract) infections: Secondary | ICD-10-CM

## 2020-02-22 DIAGNOSIS — B951 Streptococcus, group B, as the cause of diseases classified elsewhere: Secondary | ICD-10-CM

## 2020-02-22 DIAGNOSIS — O2662 Liver and biliary tract disorders in childbirth: Secondary | ICD-10-CM

## 2020-02-22 DIAGNOSIS — Z3A27 27 weeks gestation of pregnancy: Secondary | ICD-10-CM

## 2020-02-22 DIAGNOSIS — Z603 Acculturation difficulty: Secondary | ICD-10-CM

## 2020-02-22 DIAGNOSIS — O2340 Unspecified infection of urinary tract in pregnancy, unspecified trimester: Secondary | ICD-10-CM

## 2020-02-22 DIAGNOSIS — O26612 Liver and biliary tract disorders in pregnancy, second trimester: Secondary | ICD-10-CM | POA: Insufficient documentation

## 2020-02-22 DIAGNOSIS — K8020 Calculus of gallbladder without cholecystitis without obstruction: Secondary | ICD-10-CM

## 2020-02-22 DIAGNOSIS — K8080 Other cholelithiasis without obstruction: Secondary | ICD-10-CM

## 2020-02-22 DIAGNOSIS — O99891 Other specified diseases and conditions complicating pregnancy: Secondary | ICD-10-CM

## 2020-02-22 DIAGNOSIS — O26892 Other specified pregnancy related conditions, second trimester: Secondary | ICD-10-CM | POA: Insufficient documentation

## 2020-02-22 DIAGNOSIS — Z98891 History of uterine scar from previous surgery: Secondary | ICD-10-CM

## 2020-02-22 DIAGNOSIS — Z8616 Personal history of COVID-19: Secondary | ICD-10-CM

## 2020-02-22 LAB — URINALYSIS, ROUTINE W REFLEX MICROSCOPIC
Bilirubin Urine: NEGATIVE
Glucose, UA: NEGATIVE mg/dL
Hgb urine dipstick: NEGATIVE
Ketones, ur: NEGATIVE mg/dL
Nitrite: NEGATIVE
Protein, ur: NEGATIVE mg/dL
Specific Gravity, Urine: 1.018 (ref 1.005–1.030)
pH: 6 (ref 5.0–8.0)

## 2020-02-22 LAB — CBC
HCT: 34.6 % — ABNORMAL LOW (ref 36.0–46.0)
Hematocrit: 35.1 % (ref 34.0–46.6)
Hemoglobin: 11.6 g/dL (ref 11.1–15.9)
Hemoglobin: 11.3 g/dL — ABNORMAL LOW (ref 12.0–15.0)
MCH: 31.8 pg (ref 26.6–33.0)
MCH: 31.8 pg (ref 26.0–34.0)
MCHC: 33 g/dL (ref 31.5–35.7)
MCHC: 32.7 g/dL (ref 30.0–36.0)
MCV: 96 fL (ref 79–97)
MCV: 97.5 fL (ref 80.0–100.0)
Platelets: 169 10*3/uL (ref 150–450)
Platelets: 169 10*3/uL (ref 150–400)
RBC: 3.65 x10E6/uL — ABNORMAL LOW (ref 3.77–5.28)
RBC: 3.55 MIL/uL — ABNORMAL LOW (ref 3.87–5.11)
RDW: 11.8 % (ref 11.7–15.4)
RDW: 12.2 % (ref 11.5–15.5)
WBC: 11.4 10*3/uL — ABNORMAL HIGH (ref 3.4–10.8)
WBC: 12.2 10*3/uL — ABNORMAL HIGH (ref 4.0–10.5)
nRBC: 0 % (ref 0.0–0.2)

## 2020-02-22 LAB — COMPREHENSIVE METABOLIC PANEL
ALT: 12 U/L (ref 0–44)
AST: 15 U/L (ref 15–41)
Albumin: 2.9 g/dL — ABNORMAL LOW (ref 3.5–5.0)
Alkaline Phosphatase: 60 U/L (ref 38–126)
Anion gap: 10 (ref 5–15)
BUN: <5 mg/dL — ABNORMAL LOW (ref 6–20)
CO2: 21 mmol/L — ABNORMAL LOW (ref 22–32)
Calcium: 8.6 mg/dL — ABNORMAL LOW (ref 8.9–10.3)
Chloride: 106 mmol/L (ref 98–111)
Creatinine, Ser: 0.52 mg/dL (ref 0.44–1.00)
GFR calc Af Amer: >60 mL/min (ref >60)
GFR calc non Af Amer: >60 mL/min (ref >60)
Glucose, Bld: 97 mg/dL (ref 70–99)
Potassium: 3.8 mmol/L (ref 3.5–5.1)
Sodium: 137 mmol/L (ref 135–145)
Total Bilirubin: 0.5 mg/dL (ref 0.3–1.2)
Total Protein: 5.9 g/dL — ABNORMAL LOW (ref 6.5–8.1)

## 2020-02-22 LAB — RPR: RPR Ser Ql: NONREACTIVE

## 2020-02-22 LAB — GLUCOSE TOLERANCE, 2 HOURS W/ 1HR
Glucose, 1 hour: 63 mg/dL — ABNORMAL LOW (ref 65–179)
Glucose, 2 hour: 80 mg/dL (ref 65–152)
Glucose, Fasting: 87 mg/dL (ref 65–91)

## 2020-02-22 LAB — AMYLASE: Amylase: 64 U/L (ref 28–100)

## 2020-02-22 LAB — HIV ANTIBODY (ROUTINE TESTING W REFLEX): HIV Screen 4th Generation wRfx: NONREACTIVE

## 2020-02-22 LAB — LIPASE, BLOOD: Lipase: 26 U/L (ref 11–51)

## 2020-02-22 MED ORDER — LIDOCAINE VISCOUS HCL 2 % MT SOLN
15.0000 mL | Freq: Once | OROMUCOSAL | Status: AC
  Administered 2020-02-22: 15 mL via ORAL
  Filled 2020-02-22: qty 15

## 2020-02-22 MED ORDER — ALUM & MAG HYDROXIDE-SIMETH 200-200-20 MG/5ML PO SUSP
30.0000 mL | Freq: Once | ORAL | Status: AC
  Administered 2020-02-22: 30 mL via ORAL
  Filled 2020-02-22: qty 30

## 2020-02-22 MED ORDER — SIMETHICONE 80 MG PO CHEW
80.0000 mg | CHEWABLE_TABLET | Freq: Once | ORAL | Status: AC
  Administered 2020-02-22: 80 mg via ORAL
  Filled 2020-02-22: qty 1

## 2020-02-22 MED ORDER — HYOSCYAMINE SULFATE SL 0.125 MG SL SUBL: 1.0000 | SUBLINGUAL_TABLET | Freq: Three times a day (TID) | SUBLINGUAL | 0 refills | Status: DC | PRN

## 2020-02-22 MED ORDER — CALCIUM CARBONATE ANTACID 500 MG PO CHEW: 1.0000 | CHEWABLE_TABLET | Freq: Every day | ORAL | 1 refills | Status: DC

## 2020-02-22 MED ORDER — HYOSCYAMINE SULFATE 0.125 MG SL SUBL
0.1250 mg | SUBLINGUAL_TABLET | Freq: Once | SUBLINGUAL | Status: AC
  Administered 2020-02-22: 0.125 mg via SUBLINGUAL
  Filled 2020-02-22: qty 1

## 2020-02-22 NOTE — Discharge Instructions (Signed)
Calcium Carbonate chewable tablets Qu es este medicamento? El CARBONATO DE CALCIO es una sal de calcio. Se [place] como un anticido para [employer] sntomas de indigestin o [place] estomacal. Este medicamento tambin se puede Chemical engineer para prevenir el osteoporosis, como un suplemento diettico con calcio y para tratar los niveles altos de fosfato en pacientes con enfermedad renal. Este medicamento puede ser utilizado para otros usos; si tiene alguna pregunta consulte con su proveedor de atencin mdica o con su farmacutico. MARCAS COMUNES: Alka-Mints, Alka-Seltzer, Alka-Seltzer Heartburn Relief, Alkets, Antacid Fast Dissolve, Cal-Gest, Calcium Antacid, Maalox, Maalox Antacid Barrier, Maalox Quick Dissolve, Mylanta, Pepto-Bismol, Rolaids Extra Strength, Titralac, Tums, Tums Chewy Bites, Tums Cool Relief, Tums E-X, Tums Freshers, Tums Kids, Tums Lasting Effects, Tums Smooth Dissolve, Tums Smoothies, Tums Ultra Qu le debo informar a mi profesional de la salud antes de tomar este medicamento? Necesita saber si usted presenta alguno de los siguientes problemas o situaciones:  estreimiento  deshidratacin  niveles elevados de calcio en la sangre  enfermedad renal  lcera, obstruccin o sangrado estomacal  una reaccin alrgica o inusual al carbonato de calcio, a otros medicamentos, alimentos, colorantes o conservantes  si est embarazada o buscando quedar embarazada  si est amamantando a un beb Cmo debo utilizar este medicamento? Tome este medicamento por va oral. Mastquelo completamente antes de tragar. Siga las instrucciones de la etiqueta del [place]. Despus de tomar [employer], beba un vaso de agua. Los anticidos se toman generalmente despus de las comidas y a la hora de Teacher, music o como lo haya indicado su mdico o su profesional de Radiographer, therapeutic. Tome sus dosis a intervalos regulares. No tome su medicamento con una frecuencia mayor que la indicada. Hable con su pediatra  para informarse acerca del uso de este medicamento en nios. Aunque este medicamento se puede recetar a los nios para condiciones selectivas, las precauciones se aplican. Sobredosis: Pngase en contacto inmediatamente con un centro toxicolgico o una sala de urgencia si usted cree que haya tomado demasiado medicamento. ATENCIN: [employer] es solo para usted. No comparta este medicamento con nadie. Qu sucede si me olvido de una dosis? Si olvida una dosis, tmela lo antes posible. Si es casi la hora de la prxima dosis, tome slo esa dosis. No tome dosis adicionales o dobles. Qu puede interactuar con este medicamento? No tome esta medicina con ninguno de los siguientes medicamentos:  cloruro de amonio  metenamina Esta medicina tambin puede Product/process development scientist con los siguientes medicamentos:  antibiticos, tales como ciprofloxacina, tetraciclina  captopril  delarvidina  gabapentina  sales de hierro  medicamentos para infecciones micticas, quetoconazol e itraconazol  medicamentos para convulsiones, tales como etotona y fenitona  micofenolato  quinidina  rosuvastatina  sucralfato  medicamento tiroideo Puede ser que esta lista no menciona todas las posibles interacciones. Informe a su profesional de [employer] de [employer] productos a base de hierbas, medicamentos de [place] o suplementos nutritivos que est tomando. Si usted fuma, consume bebidas alcohlicas o si utiliza drogas ilegales, indqueselo tambin a su profesional de [employer]. Algunas sustancias pueden interactuar con su medicamento. A qu debo estar atento al usar [employer]? Si los sntomas no comienzan a mejorar o si empeoran, consulte con su mdico o con su profesional de [employer]. Si tiene [employer], no se d tratamiento usted mismo con este medicamento durante ms de 2 semanas. Si observa heces de color oscuro o aspecto alquitranado, si sufre una hemorragia rectal o si se siente  inusualmente cansado, consulte  a su mdico. No cambie de producto anticido sin asesoramiento. Si est tomando otros medicamentos, deje transcurrir un perodo de por lo menos 2 horas antes o despus de tomar una dosis de [employer]. Beba varios vasos de Warehouse manager. Esto lo ayudar a reducir la probabilidad de sufrir estreimiento. Qu efectos secundarios puedo tener al [employer] este medicamento? Efectos secundarios que debe informar a su mdico o a Producer, television/film/video de la salud tan pronto como sea posible:  Therapist, art como erupcin cutnea, picazn o urticarias, hinchazn de la cara, labios o lengua  confusin o irritabilidad  dolor de cabeza  prdida del apetito  nuseas, vmito  cansancio o debilidad inusual Efectos secundarios que, por lo general, no requieren atencin mdica (debe informarlos a su mdico o a su profesional de la salud si persisten o si son molestos):  estreimiento  gases Puede ser que esta lista no menciona todos los posibles efectos secundarios. Comunquese a su mdico por asesoramiento mdico [employer]. Usted puede informar los efectos secundarios a la FDA por telfono al 1-800-FDA-1088. Dnde debo guardar mi medicina? Mantngala fuera del alcance de los nios. Gurdela a [employer], entre 15 y 30 grados C (57 y 30 grados F). Deseche todo el medicamento que no haya utilizado, despus de la fecha de vencimiento. ATENCIN: Este folleto es un resumen. Puede ser que no cubra toda la posible informacin. Si usted tiene preguntas acerca de esta medicina, consulte con su mdico, su farmacutico o su profesional de Radiographer, therapeutic.  2020 Elsevier/Gold Standard (2014-07-03 00:00:00) Colelitiasis Cholelithiasis  La colelitiasis tambin recibe el nombre de "clculos biliares". Es un tipo de enfermedad de la vescula biliar. La vescula biliar es un rgano que almacena un lquido (bilis) que ayuda a Location manager las [place]. Los clculos  biliares podran no causar sntomas (clculos biliares silenciosos) hasta provocar una obstruccin; luego, pueden causar dolor (ataque de la vescula biliar). Siga estas indicaciones en su casa:  Tome los medicamentos de venta libre y los recetados solamente como se lo haya indicado el mdico.  Mantenga un peso saludable.  Consuma alimentos saludables. Esto incluye lo siguiente: ? Comer una menor cantidad de alimentos grasos, como los alimentos fritos. ? Comer una menor cantidad de carbohidratos refinados. Los carbohidratos refinados son los panes y los cereales muy procesados, como el pan blanco y el arroz blanco. En cambio, elegir cereales integrales, como el pan integral o el arroz integral. ? Consumir ms fibra. [place], las frutas frescas y los frijoles son fuentes saludables de [place].  Concurra a todas las visitas de [address] se lo haya indicado el mdico. Esto es importante. Comunquese con un mdico si:  De repente, siente dolor en el costado superior derecho del vientre (abdomen). El dolor podra extenderse hasta el hombro derecho o el pecho. Estos pueden ser sntomas de un ataque de la vescula biliar.  Siente Programme researcher, broadcasting/film/video (tiene nuseas).  Devuelve (vomita).  Le han diagnosticado clculos biliares que no presentan sntomas y tiene lo siguiente: ? Dolor abdominal. ? Molestias, ardor o sensacin de plenitud en la parte superior del vientre (empacho). Solicite ayuda de inmediato si:  De repente, siente dolor en el costado superior derecho del vientre que dura ms de 2horas.  Tiene dolor abdominal que dura ms de 5horas.  Tiene fiebre o siente escalofros.  Sigue sintiendo nuseas o vomitando.  Nota que la piel o la parte blanca del ojo estn amarillas (ictericia).  Hace pis (orina) de color oscuro.  Su materia  fecal (heces) es de [employer] claro. Resumen  La colelitiasis tambin recibe el nombre de "clculos biliares".  La vescula biliar es un  rgano que almacena un lquido (bilis) que ayuda a Location manager las [place].  Los clculos biliares silenciosos son clculos biliares que no causan sntomas.  Un ataque de la vescula biliar podra causar un dolor repentino en el costado superior derecho del vientre. El dolor podra extenderse hasta el hombro derecho o el pecho. Si esto ocurre, comunquese con el mdico.  Si le aparece un dolor repentino en el costado superior derecho del vientre que dura ms de 2horas, busque ayuda de inmediato. Esta informacin no tiene Theme park manager el consejo del mdico. Asegrese de hacerle al mdico cualquier pregunta que tenga. Document Revised: 11/08/2016 Document Reviewed: 11/01/2012 Elsevier Patient Education  2020 [employer].

## 2020-02-22 NOTE — MAU Note (Addendum)
PT SAYS WITH INTERPRETER- PRISCILLA - UPPER ABD PAIN- STARTED  0230.  ATE AT 9 PM - [employer]. NO VOMITING.  FEELS NAUSEA - STARTED AT 830 AM - AFTER HER SUGAR TEST IN THE OFFICE . NO DIARRHEA.

## 2020-02-22 NOTE — MAU Provider Note (Signed)
Chief Complaint:  Abdominal Pain   First Provider Initiated Contact with Patient 02/22/20 0324     HPI: Paula Gamble is a 24 y.o. [id number] at 63w6dwho presents to maternity admissions reporting pain in epigastric and upper abdomen since 0230.  Ate at 9pm.  Has nausea also. . She reports good fetal movement, denies LOF, vaginal bleeding, vaginal itching/burning, urinary symptoms, h/a, dizziness, diarrhea, constipation or fever/chills.    Abdominal Pain This is a new problem. The current episode started today. The problem occurs constantly. The problem has been unchanged. The pain is located in the epigastric region, RUQ and LUQ. The pain is moderate. The abdominal pain does not radiate. Associated symptoms include nausea (ever since drinking glucola in the morning). Pertinent negatives include no constipation, diarrhea, dysuria, fever, myalgias or vomiting. Nothing aggravates the pain. The pain is relieved by nothing. She has tried nothing for the symptoms.     RN Note: PT SAYS WITH INTERPRETER- PRISCILLA - UPPER ABD PAIN- STARTED  0230.  ATE AT 9 PM - [employer]. NO VOMITING.  FEELS NAUSEA - STARTED AT 830PM. NO DIARRHEA.   Past Medical History: Past Medical History:  Diagnosis Date  . Abnormal Pap smear of cervix 01/06/2017   LSIL - Needs repeat 04/2017  . COVID-19   . Pyelonephritis     Past obstetric history: OB History  Gravida Para Term Preterm AB Living  4 1 1   2 1   SAB TAB Ectopic Multiple Live Births  2     0 1    # Outcome Date GA Lbr Len/2nd Weight Sex Delivery Anes PTL Lv  4 Current           3 Term 11/25/16 [redacted]w[redacted]d  3901 g M CS-LTranv EPI  LIV  2 SAB 2016 [redacted]w[redacted]d         1 SAB             Past Surgical History: Past Surgical History:  Procedure Laterality Date  . ABDOMINOPLASTY    . CESAREAN SECTION N/A 11/25/2016   Procedure: CESAREAN SECTION;  Surgeon: 01/26/2017, MD;  Location: [facility] BIRTHING SUIT[facility];  Service: Obstetrics;  Laterality: N/A;  .  DILATION AND CURETTAGE OF UTERUS     x2    Family History: Family History  Problem Relation Age of Onset  . Diabetes Paternal Uncle   . Cancer Maternal Grandmother   . Diabetes Maternal Grandmother   . Hypertension Maternal Grandmother   . Cancer Paternal Grandmother        liver    Social History: Social History   Tobacco Use  . Smoking status: Never Smoker  . Smokeless tobacco: Never Used  Vaping Use  . Vaping Use: Former  Substance Use Topics  . Alcohol use: Not Currently    Comment: not since January  . Drug use: Not Currently    Types: Marijuana    Comment: last used before 09/19/19     Allergies:  Allergies  Allergen Reactions  . Latex Itching    Meds:  Medications Prior to Admission  Medication Sig Dispense Refill Last Dose  . Prenatal Vit-Fe Fumarate-FA (MULTIVITAMIN-PRENATAL) 27-0.8 MG TABS tablet Take 1 tablet by mouth daily at 12 noon.       I have reviewed patient's Past Medical Hx, Surgical Hx, Family Hx, Social Hx, medications and allergies.   ROS:  Review of Systems  Constitutional: Negative for fever.  Gastrointestinal: Positive for abdominal pain and nausea (ever since drinking glucola in  the morning). Negative for constipation, diarrhea and vomiting.  Genitourinary: Negative for dysuria.  Musculoskeletal: Negative for myalgias.   Other systems negative  Physical Exam   Vitals:   02/22/20 0320 02/22/20 0507  BP: 106/65 (!) 103/44  Pulse: 87 79  Resp: 20   Temp: 98 F (36.7 C)     Constitutional: Well-developed, well-nourished female in no acute distress.  Cardiovascular: normal rate and rhythm Respiratory: normal effort, clear to auscultation bilaterally GI: Abd soft, moderately tender over upper abdomen, gravid appropriate for gestational age.   No rebound or guarding. MS: Extremities nontender, no edema, normal ROM Neurologic: Alert and oriented x 4.  GU: Neg CVAT.  PELVIC EXAM: deferred  FHT:  Baseline 130 , moderate  variability, accelerations present, no decelerations Contractions:  none   Labs: Results for orders placed or performed during the hospital encounter of 02/22/20 (from the past 24 hour(s))  Urinalysis, Routine w reflex microscopic Urine, Clean Catch     Status: Abnormal   Collection Time: 02/22/20  3:32 AM  Result Value Ref Range   Color, Urine YELLOW YELLOW   APPearance HAZY (A) CLEAR   Specific Gravity, Urine 1.018 1.005 - 1.030   pH 6.0 5.0 - 8.0   Glucose, UA NEGATIVE NEGATIVE mg/dL   Hgb urine dipstick NEGATIVE NEGATIVE   Bilirubin Urine NEGATIVE NEGATIVE   Ketones, ur NEGATIVE NEGATIVE mg/dL   Protein, ur NEGATIVE NEGATIVE mg/dL   Nitrite NEGATIVE NEGATIVE   Leukocytes,Ua SMALL (A) NEGATIVE   RBC / HPF 0-5 0 - 5 RBC/hpf   WBC, UA 6-10 0 - 5 WBC/hpf   Bacteria, UA FEW (A) NONE SEEN   Squamous Epithelial / LPF 11-20 0 - 5   Mucus PRESENT   CBC     Status: Abnormal   Collection Time: 02/22/20  3:42 AM  Result Value Ref Range   WBC 12.2 (H) 4.0 - 10.5 K/uL   RBC 3.55 (L) 3.87 - 5.11 MIL/uL   Hemoglobin 11.3 (L) 12.0 - 15.0 g/dL   HCT 83.4 (L) 36 - 46 %   MCV 97.5 80.0 - 100.0 fL   MCH 31.8 26.0 - 34.0 pg   MCHC 32.7 30.0 - 36.0 g/dL   RDW 19.6 22.2 - 97.9 %   Platelets 169 150 - 400 K/uL   nRBC 0.0 0.0 - 0.2 %  Comprehensive metabolic panel     Status: Abnormal   Collection Time: 02/22/20  3:42 AM  Result Value Ref Range   Sodium 137 135 - 145 mmol/L   Potassium 3.8 3.5 - 5.1 mmol/L   Chloride 106 98 - 111 mmol/L   CO2 21 (L) 22 - 32 mmol/L   Glucose, Bld 97 70 - 99 mg/dL   BUN <5 (L) 6 - 20 mg/dL   Creatinine, Ser 8.92 0.44 - 1.00 mg/dL   Calcium 8.6 (L) 8.9 - 10.3 mg/dL   Total Protein 5.9 (L) 6.5 - 8.1 g/dL   Albumin 2.9 (L) 3.5 - 5.0 g/dL   AST 15 15 - 41 U/L   ALT 12 0 - 44 U/L   Alkaline Phosphatase 60 38 - 126 U/L   Total Bilirubin 0.5 0.3 - 1.2 mg/dL   GFR calc non Af Amer >60 >60 mL/min   GFR calc Af Amer >60 >60 mL/min   Anion gap 10 5 - 15   Lipase, blood     Status: None   Collection Time: 02/22/20  3:42 AM  Result Value Ref Range  Lipase 26 11 - 51 U/L  Amylase     Status: None   Collection Time: 02/22/20  3:42 AM  Result Value Ref Range   Amylase 64 28 - 100 U/L    O/Positive/-- (06/10 1206)  Imaging:  US ABDOMEN LIMITED RUQ  Result Date: 02/22/2020 CLINICAL DATA:  Postprandial right upper quadrant pain EXAM: ULTRASOUND ABDOMEN LIMITED RIGHT UPPER QUADRANT COMPARISON:  None. FINDINGS: Gallbladder: There is mobile appearing material within the gallbladder that is nonshadowing but does intermittently have a discrete rounded appearance. No wall thickening or focal tenderness. Common bile duct: Diameter: 2 mm Liver: No focal lesion identified. Within normal limits in parenchymal echogenicity. Portal vein is patent on color Doppler imaging with normal direction of blood flow towards the liver. Other: None. IMPRESSION: 1. Gallbladder sludge/nonshadowing calculi. 2. No findings of cholecystitis. Electronically Signed   By: Marnee Spring M.D.   On: 02/22/2020 04:12     MAU Course/MDM: I have ordered labs and reviewed results. Labs are normal, no elevated LFTs, amylase or lipase NST reviewed, reassuring.  Treatments in MAU included Levsin, simethicone, and GI cocktail which completely relieved her pain.  Discussed gallbladder stones and sludge. Reviewed precautions to return for (severe pain with vomiting), and that she may need surgery at some point Interpretor used.    Assessment: Single IUP at [redacted]w[redacted]d Epigastric pain Cholelithiasis  Plan: Discharge home Rx Levsin prn cramps Rx TUMS prn reflux Preterm Labor precautions and fetal kick counts Follow up in Office for prenatal visits   Encouraged to return here or to other Urgent Care/ED if she develops worsening of symptoms, increase in pain, fever, or other concerning symptoms.   Pt stable at time of discharge.  Wynelle Bourgeois CNM, MSN Certified  Nurse-Midwife 02/22/2020 3:24 AM

## 2020-03-06 ENCOUNTER — Encounter: Payer: Self-pay | Admitting: Obstetrics and Gynecology

## 2020-03-06 ENCOUNTER — Ambulatory Visit (INDEPENDENT_AMBULATORY_CARE_PROVIDER_SITE_OTHER): Payer: Self-pay | Admitting: Obstetrics and Gynecology

## 2020-03-06 ENCOUNTER — Other Ambulatory Visit: Payer: Self-pay

## 2020-03-06 VITALS — BP 121/62 | HR 80 | Wt 201.5 lb

## 2020-03-06 DIAGNOSIS — K8020 Calculus of gallbladder without cholecystitis without obstruction: Secondary | ICD-10-CM

## 2020-03-06 DIAGNOSIS — Z8719 Personal history of other diseases of the digestive system: Secondary | ICD-10-CM | POA: Insufficient documentation

## 2020-03-06 DIAGNOSIS — O0993 Supervision of high risk pregnancy, unspecified, third trimester: Secondary | ICD-10-CM

## 2020-03-06 DIAGNOSIS — Z98891 History of uterine scar from previous surgery: Secondary | ICD-10-CM

## 2020-03-06 DIAGNOSIS — Z3A29 29 weeks gestation of pregnancy: Secondary | ICD-10-CM

## 2020-03-06 MED ORDER — FAMOTIDINE 20 MG PO TABS: 20.0000 mg | ORAL_TABLET | Freq: Two times a day (BID) | ORAL | 2 refills | Status: DC

## 2020-03-06 NOTE — Progress Notes (Signed)
Spanish Interpreter Eda R. 

## 2020-03-06 NOTE — Progress Notes (Signed)
Prenatal Visit Note Date: 03/06/2020 Clinic: [facility]  Subjective:  Latrelle Fuston is a 24 y.o. [phone] at [redacted]w[redacted]d being seen today for ongoing prenatal care.  She is currently monitored for the following issues for this high-risk pregnancy and has Supervision of low-risk pregnancy; Language barrier; History of COVID-19; History of pyelonephritis during pregnancy; History of C-section; Obesity during pregnancy; GBS (group B streptococcus) UTI complicating pregnancy; Anemia in pregnancy; History of ileus; and Gallstone on their problem list.  Patient reports no complaints.   Contractions: Not present. Vag. Bleeding: None.  Movement: Present. Denies leaking of fluid.   The following portions of the patient's history were reviewed and updated as appropriate: allergies, current medications, past family history, past medical history, past social history, past surgical history and problem list. Problem list updated.  Objective:   Vitals:   03/06/20 1509  BP: 121/62  Pulse: 80  Weight: 201 lb 8 oz (91.4 kg)    Fetal Status: Fetal Heart Rate (bpm): 140 Fundal Height: 29 cm Movement: Present     General:  Alert, oriented and cooperative. Patient is in no acute distress.  Skin: Skin is warm and dry. No rash noted.   Cardiovascular: Normal heart rate noted  Respiratory: Normal respiratory effort, no problems with respiration noted  Abdomen: Soft, gravid, appropriate for gestational age. Pain/Pressure: Present     Pelvic:  Cervical exam deferred        Extremities: Normal range of motion.  Edema: Trace  Mental Status: Normal mood and affect. Normal behavior. Normal judgment and thought content.   Urinalysis:      Assessment and Plan:  Pregnancy: G4P1021 at [redacted]w[redacted]d  1. Calculus of gallbladder without cholecystitis without obstruction Has GERD. Recommend pepcid, low fat diet and referral to gen surg 6-12wks PP for consideration of cholecystectomy. ED precautions  given  2. Supervision of high risk pregnancy in third trimester Routine care  3. History of C-section 11/2016: pLTCS. 39wks, SROM 1cm, not in labor. Arrest of dilation at 5-6cm. 3900gm. I told her I recommend a 36wk growth u/s. If similar weight or bigger, I told her I'd recommend awaiting spontaneous labor and if not then scheduled c-section. If smaller, then doing an IOL is reasonable if no labor. Risk of uterine rupture, etc d/w. For now, pt would like to tolac  Preterm labor symptoms and general obstetric precautions including but not limited to vaginal bleeding, contractions, leaking of fluid and fetal movement were reviewed in detail with the patient. Please refer to After Visit Summary for other counseling recommendations.  RTC 2 weeks   Roosevelt Bing, MD

## 2020-03-18 ENCOUNTER — Inpatient Hospital Stay (HOSPITAL_BASED_OUTPATIENT_CLINIC_OR_DEPARTMENT_OTHER): Payer: Self-pay

## 2020-03-18 ENCOUNTER — Encounter: Payer: Self-pay | Admitting: General Practice

## 2020-03-18 ENCOUNTER — Inpatient Hospital Stay (HOSPITAL_COMMUNITY)
Admission: AD | Admit: 2020-03-18 | Discharge: 2020-03-18 | Disposition: A | Discharge status: Home / Self Care | Payer: Self-pay | Attending: Obstetrics & Gynecology | Admitting: Obstetrics & Gynecology

## 2020-03-18 ENCOUNTER — Encounter (HOSPITAL_COMMUNITY): Payer: Self-pay | Admitting: Obstetrics & Gynecology

## 2020-03-18 ENCOUNTER — Inpatient Hospital Stay (HOSPITAL_COMMUNITY): Payer: Self-pay

## 2020-03-18 DIAGNOSIS — Z87440 Personal history of urinary (tract) infections: Secondary | ICD-10-CM

## 2020-03-18 DIAGNOSIS — R102 Pelvic and perineal pain: Secondary | ICD-10-CM | POA: Insufficient documentation

## 2020-03-18 DIAGNOSIS — B951 Streptococcus, group B, as the cause of diseases classified elsewhere: Secondary | ICD-10-CM

## 2020-03-18 DIAGNOSIS — K828 Other specified diseases of gallbladder: Secondary | ICD-10-CM

## 2020-03-18 DIAGNOSIS — Z3A31 31 weeks gestation of pregnancy: Secondary | ICD-10-CM

## 2020-03-18 DIAGNOSIS — O99019 Anemia complicating pregnancy, unspecified trimester: Secondary | ICD-10-CM

## 2020-03-18 DIAGNOSIS — O99613 Diseases of the digestive system complicating pregnancy, third trimester: Secondary | ICD-10-CM

## 2020-03-18 DIAGNOSIS — O26893 Other specified pregnancy related conditions, third trimester: Secondary | ICD-10-CM

## 2020-03-18 DIAGNOSIS — K8020 Calculus of gallbladder without cholecystitis without obstruction: Secondary | ICD-10-CM

## 2020-03-18 DIAGNOSIS — Z3493 Encounter for supervision of normal pregnancy, unspecified, third trimester: Secondary | ICD-10-CM

## 2020-03-18 DIAGNOSIS — O26613 Liver and biliary tract disorders in pregnancy, third trimester: Secondary | ICD-10-CM | POA: Insufficient documentation

## 2020-03-18 DIAGNOSIS — O34219 Maternal care for unspecified type scar from previous cesarean delivery: Secondary | ICD-10-CM | POA: Insufficient documentation

## 2020-03-18 DIAGNOSIS — Z98891 History of uterine scar from previous surgery: Secondary | ICD-10-CM

## 2020-03-18 DIAGNOSIS — Z8616 Personal history of COVID-19: Secondary | ICD-10-CM | POA: Insufficient documentation

## 2020-03-18 DIAGNOSIS — Z789 Other specified health status: Secondary | ICD-10-CM

## 2020-03-18 DIAGNOSIS — O99210 Obesity complicating pregnancy, unspecified trimester: Secondary | ICD-10-CM

## 2020-03-18 LAB — CBC
HCT: 35.7 % — ABNORMAL LOW (ref 36.0–46.0)
Hemoglobin: 11.6 g/dL — ABNORMAL LOW (ref 12.0–15.0)
MCH: 31.5 pg (ref 26.0–34.0)
MCHC: 32.5 g/dL (ref 30.0–36.0)
MCV: 97 fL (ref 80.0–100.0)
Platelets: 178 10*3/uL (ref 150–400)
RBC: 3.68 MIL/uL — ABNORMAL LOW (ref 3.87–5.11)
RDW: 12.3 % (ref 11.5–15.5)
WBC: 18.1 10*3/uL — ABNORMAL HIGH (ref 4.0–10.5)
nRBC: 0 % (ref 0.0–0.2)

## 2020-03-18 LAB — LIPASE, BLOOD: Lipase: 27 U/L (ref 11–51)

## 2020-03-18 LAB — COMPREHENSIVE METABOLIC PANEL
ALT: 12 U/L (ref 0–44)
AST: 15 U/L (ref 15–41)
Albumin: 2.9 g/dL — ABNORMAL LOW (ref 3.5–5.0)
Alkaline Phosphatase: 90 U/L (ref 38–126)
Anion gap: 8 (ref 5–15)
BUN: 5 mg/dL — ABNORMAL LOW (ref 6–20)
CO2: 23 mmol/L (ref 22–32)
Calcium: 8.7 mg/dL — ABNORMAL LOW (ref 8.9–10.3)
Chloride: 101 mmol/L (ref 98–111)
Creatinine, Ser: 0.55 mg/dL (ref 0.44–1.00)
GFR, Estimated: >60 mL/min (ref >60)
Glucose, Bld: 82 mg/dL (ref 70–99)
Potassium: 3.5 mmol/L (ref 3.5–5.1)
Sodium: 132 mmol/L — ABNORMAL LOW (ref 135–145)
Total Bilirubin: 0.3 mg/dL (ref 0.3–1.2)
Total Protein: 6 g/dL — ABNORMAL LOW (ref 6.5–8.1)

## 2020-03-18 LAB — URINALYSIS, ROUTINE W REFLEX MICROSCOPIC
Bacteria, UA: NONE SEEN
Bilirubin Urine: NEGATIVE
Glucose, UA: NEGATIVE mg/dL
Hgb urine dipstick: NEGATIVE
Ketones, ur: NEGATIVE mg/dL
Nitrite: NEGATIVE
Protein, ur: NEGATIVE mg/dL
Specific Gravity, Urine: 1.012 (ref 1.005–1.030)
pH: 7 (ref 5.0–8.0)

## 2020-03-18 LAB — WET PREP, GENITAL
Clue Cells Wet Prep HPF POC: NONE SEEN
Sperm: NONE SEEN
Trich, Wet Prep: NONE SEEN
Yeast Wet Prep HPF POC: NONE SEEN

## 2020-03-18 MED ORDER — NIFEDIPINE 10 MG PO CAPS
10.0000 mg | ORAL_CAPSULE | ORAL | Status: DC | PRN
  Administered 2020-03-18: 10 mg via ORAL
  Filled 2020-03-18 (×2): qty 1

## 2020-03-18 MED ORDER — OXYCODONE-ACETAMINOPHEN 5-325 MG PO TABS
2.0000 | ORAL_TABLET | Freq: Once | ORAL | Status: AC
  Administered 2020-03-18: 2 via ORAL
  Filled 2020-03-18: qty 2

## 2020-03-18 MED ORDER — LACTATED RINGERS IV BOLUS
1000.0000 mL | Freq: Once | INTRAVENOUS | Status: AC
  Administered 2020-03-18: 1000 mL via INTRAVENOUS

## 2020-03-18 NOTE — MAU Provider Note (Addendum)
History     CSN: [id number]  Arrival date and time: 03/18/20 1538   First Provider Initiated Contact with Patient 03/18/20 1603      Chief Complaint  Patient presents with  . Abdominal Pain   HPI Paula Gamble is a 24 y.o. [id number] at [redacted]w[redacted]d who presents to MAU with chief complaint of acute, severe generalized abdominal pain. This is a new problem, onset about 90 minutes prior to arrival and worsening significantly in the past 30 minutes. Her pain is 10/10, radiates to her lower back. She denies alleviating factors. She has not taken medication or tried other treatments. She denies vaginal bleeding, leaking of fluid, decreased fetal movement, fever, falls, or recent illness.   Patient is s/p cesarean in 2018 and abdominoplasty in 2019. She receives care with [facility].  OB History    Gravida  4   Para  1   Term  1   Preterm      AB  2   Living  1     SAB  2   TAB      Ectopic      Multiple  0   Live Births  1           Past Medical History:  Diagnosis Date  . Abnormal Pap smear of cervix 01/06/2017   LSIL - Needs repeat 04/2017  . COVID-19 04/2019  . Pyelonephritis     Past Surgical History:  Procedure Laterality Date  . ABDOMINOPLASTY    . CESAREAN SECTION N/A 11/25/2016   Procedure: CESAREAN SECTION;  Surgeon: Lazaro Arms, MD;  Location: [facility] BIRTHING SUITES;  Service: Obstetrics;  Laterality: N/A;  . DILATION AND CURETTAGE OF UTERUS     x2    Family History  Problem Relation Age of Onset  . Diabetes Paternal Uncle   . Cancer Maternal Grandmother   . Diabetes Maternal Grandmother   . Hypertension Maternal Grandmother   . Cancer Paternal Grandmother        liver    Social History   Tobacco Use  . Smoking status: Never Smoker  . Smokeless tobacco: Never Used  Vaping Use  . Vaping Use: Former  Substance Use Topics  . Alcohol use: Not Currently    Comment: not since January  . Drug use: Not Currently    Types: Marijuana     Comment: last used before 09/19/19     Allergies:  Allergies  Allergen Reactions  . Latex Itching    Medications Prior to Admission  Medication Sig Dispense Refill Last Dose  . calcium carbonate (TUMS) 500 MG chewable tablet Chew 1 tablet (200 mg of elemental calcium total) by mouth daily. 30 tablet 1   . famotidine (PEPCID) 20 MG tablet Take 1 tablet (20 mg total) by mouth 2 (two) times daily. 60 tablet 2   . Hyoscyamine Sulfate SL (LEVSIN/SL) 0.125 MG SUBL Place 1 tablet under the tongue every 8 (eight) hours as needed (abdominal cramping). 30 tablet 0   . Prenatal Vit-Fe Fumarate-FA (MULTIVITAMIN-PRENATAL) 27-0.8 MG TABS tablet Take 1 tablet by mouth daily at 12 noon.       Review of Systems  Gastrointestinal: Positive for abdominal pain.  Genitourinary: Negative for vaginal bleeding, vaginal discharge and vaginal pain.  Musculoskeletal: Positive for back pain.  All other systems reviewed and are negative.  Physical Exam   Blood pressure (!) 98/47, pulse (!) 112, temperature 98.4 F (36.9 C), temperature source Oral, resp. rate 20, last menstrual  period 08/04/2019, SpO2 99 %, currently breastfeeding.  Physical Exam Vitals and nursing note reviewed. Exam conducted with a chaperone present.  Constitutional:      General: She is in acute distress.  Abdominal:     Palpations: Abdomen is soft.     Tenderness: There is no abdominal tenderness. There is no guarding or rebound.  Genitourinary:    Vagina: Normal.     Cervix: Normal.     Uterus: Normal.      Comments: Pelvic exam: External genitalia normal, vaginal walls pink and well rugated, cervix visually closed, no lesions noted. Bimanual: cervix 0/thick/posterior, neg CMT  Skin:    General: Skin is warm and dry.     Capillary Refill: Capillary refill takes less than 2 seconds.  Neurological:     General: No focal deficit present.     Mental Status: She is alert.  Psychiatric:        Mood and Affect: Mood normal.         Behavior: Behavior normal.     MAU Course  Procedures  --Patient met on arrival due to vocalization, report of 10/10 pain on arrival to MAU --Cervix visually closed on speculum exam --Reassuring initial tracing, no contractions palpated but will treat for preterm labor as patient is crying, moaning, holding her abdomen, endorses generalized abdominal pain and focused, heightened area of pain along her abdominoplasty scar --Preexisting diagnosis of gallstones --Pain resolved with PO Percocet --No concerning findings on physical exam or MFM OB Limited --Reassuring fetal tracing: baseline 150, mod variability, pos 10 x 10 accels, no decels --Toco: occasional UI, otherwise quiet. CTX not palpated at any time --Elevated WBCs, Patient reports severe vomiting and diarrhea which last for several hours 3 days ago, then resolved without intervention. Continue to monitor  Orders Placed This Encounter  Procedures  . Wet prep, genital  . [place] MFM OB Limited  . US ABDOMEN LIMITED RUQ (LIVER/GB)  . Urinalysis, Routine w reflex microscopic  . CBC  . Comprehensive metabolic panel  . Lipase, blood  . Insert peripheral IV   Meds ordered this encounter  Medications  . lactated ringers bolus 1,000 mL  . NIFEdipine (PROCARDIA) capsule 10 mg  . oxyCODONE-acetaminophen (PERCOCET/ROXICET) 5-325 MG per tablet 2 tablet   Results for orders placed or performed during the hospital encounter of 03/18/20 (from the past 24 hour(s))  Wet prep, genital     Status: Abnormal   Collection Time: 03/18/20  4:03 PM   Specimen: Vaginal  Result Value Ref Range   Yeast Wet Prep HPF POC NONE SEEN NONE SEEN   Trich, Wet Prep NONE SEEN NONE SEEN   Clue Cells Wet Prep HPF POC NONE SEEN NONE SEEN   WBC, Wet Prep HPF POC MANY (A) NONE SEEN   Sperm NONE SEEN   Urinalysis, Routine w reflex microscopic     Status: Abnormal   Collection Time: 03/18/20  4:03 PM  Result Value Ref Range   Color, Urine YELLOW YELLOW    APPearance HAZY (A) CLEAR   Specific Gravity, Urine 1.012 1.005 - 1.030   pH 7.0 5.0 - 8.0   Glucose, UA NEGATIVE NEGATIVE mg/dL   Hgb urine dipstick NEGATIVE NEGATIVE   Bilirubin Urine NEGATIVE NEGATIVE   Ketones, ur NEGATIVE NEGATIVE mg/dL   Protein, ur NEGATIVE NEGATIVE mg/dL   Nitrite NEGATIVE NEGATIVE   Leukocytes,Ua TRACE (A) NEGATIVE   RBC / HPF 0-5 0 - 5 RBC/hpf   WBC, UA 0-5 0 - 5  WBC/hpf   Bacteria, UA NONE SEEN NONE SEEN   Squamous Epithelial / LPF 0-5 0 - 5   Mucus PRESENT   CBC     Status: Abnormal   Collection Time: 03/18/20  4:40 PM  Result Value Ref Range   WBC 18.1 (H) 4.0 - 10.5 K/uL   RBC 3.68 (L) 3.87 - 5.11 MIL/uL   Hemoglobin 11.6 (L) 12.0 - 15.0 g/dL   HCT 35.7 (L) 36 - 46 %   MCV 97.0 80.0 - 100.0 fL   MCH 31.5 26.0 - 34.0 pg   MCHC 32.5 30.0 - 36.0 g/dL   RDW 12.3 11.5 - 15.5 %   Platelets 178 150 - 400 K/uL   nRBC 0.0 0.0 - 0.2 %  Comprehensive metabolic panel     Status: Abnormal   Collection Time: 03/18/20  4:46 PM  Result Value Ref Range   Sodium 132 (L) 135 - 145 mmol/L   Potassium 3.5 3.5 - 5.1 mmol/L   Chloride 101 98 - 111 mmol/L   CO2 23 22 - 32 mmol/L   Glucose, Bld 82 70 - 99 mg/dL   BUN 5 (L) 6 - 20 mg/dL   Creatinine, Ser 0.55 0.44 - 1.00 mg/dL   Calcium 8.7 (L) 8.9 - 10.3 mg/dL   Total Protein 6.0 (L) 6.5 - 8.1 g/dL   Albumin 2.9 (L) 3.5 - 5.0 g/dL   AST 15 15 - 41 U/L   ALT 12 0 - 44 U/L   Alkaline Phosphatase 90 38 - 126 U/L   Total Bilirubin 0.3 0.3 - 1.2 mg/dL   GFR, Estimated >60 >60 mL/min   Anion gap 8 5 - 15  Lipase, blood     Status: None   Collection Time: 03/18/20  4:46 PM  Result Value Ref Range   Lipase 27 11 - 51 U/L   US ABDOMEN LIMITED RUQ (LIVER/GB)  Result Date: 03/18/2020 CLINICAL DATA:  Abdominal pain. Generalized abdominal pain. Pregnant patient. EXAM: ULTRASOUND ABDOMEN LIMITED RIGHT UPPER QUADRANT COMPARISON:  Right upper quadrant ultrasound 02/22/2020 FINDINGS: Gallbladder: Physiologically distended.  Layering sludge. Small 4 mm echogenic structure may represent a small gallstone or sludge ball. No gallbladder wall thickening. Presence or absence of sonographic Percell Miller sign was not reported by the sonographer. Common bile duct: Diameter: 1-2 mm, normal. Liver: No focal lesion identified. Within normal limits in parenchymal echogenicity. Portal vein is patent on color Doppler imaging with normal direction of blood flow towards the liver. Other: No right upper quadrant free fluid. IMPRESSION: 1. Layering gallbladder sludge and a small 4 mm gallstone versus sludge ball. No sonographic findings of acute cholecystitis. 2. No biliary dilatation. 3. Unremarkable sonographic appearance of the liver. Electronically Signed   By: Keith Rake M.D.   On: 03/18/2020 19:37   Assessment and Plan  --24 y.o. [id number] at [redacted]w[redacted]d  --Reactive tracing, closed cervix --S/p US OB Limited, no concerning findings --S/p MFM OB Limited US, Gallstones, non-obstructing --MSK pain r/t previous cesarean and abdominoplasty --Patient denies pain following Percocet and for remainder of visit --Language barrier: [employer] present for all patient interaction --Discharge home in stable condition  F/U: --Next appt [facility] 03/27/2020  Darlina Rumpf, [place] 03/18/2020, 8:30 PM

## 2020-03-18 NOTE — Discharge Instructions (Signed)
Plan de alimentacin para problemas de vescula biliar Gallbladder Eating Plan Si tiene una afeccin de la vescula biliar, puede tener problemas para digerir las grasas. Consumir una dieta con bajo contenido de grasas puede disminuir los sntomas, y puede ser beneficiosa antes y despus de una ciruga de extraccin de vescula biliar (colecistectoma). El mdico puede recomendarle que trabaje con un especialista en dietas y alimentacin (nutricionista) para que lo ayude a reducir la cantidad de grasas en su dieta. Consejos para seguir este plan [place] generales  Limite el consumo de grasas a menos del 30% del total de caloras diarias. Si usted ingiere alrededor de 1800 caloras diarias, esto es menos de 60 gramos (g) de grasas por da.  La grasa es una parte importante de una dieta saludable. Consumir una dieta con bajo contenido de grasas puede dificultar mantener un peso corporal saludable. Pregunte a su nutricionista qu cantidad de grasas, caloras y otros nutrientes necesita diariamente.  Haga comidas pequeas y frecuentes durante el da en lugar de tres comidas abundantes.  Beba de 8 a 10 vasos de lquido por da como mnimo. Beba suficiente lquido como para mantener la orina clara o de color amarillo plido.  Limite el consumo de alcohol a no ms de 1medida por da si es mujer y no est embarazada, y 2medidas por da si es hombre. Una medida equivale a 12oz (355ml) de cerveza, 5oz (148ml) de vino o 1oz (44ml) de bebidas alcohlicas de alta graduacin. Lea las etiquetas de los alimentos  Consulte la informacin nutricional en las etiquetas de los alimentos para conocer la cantidad de grasas por porcin. Elija alimentos con menos de 3 gramos de grasas por porcin. De compras  Elija alimentos saludables sin grasas o con bajo contenido de grasas. Busque las palabras "sin grasa", "bajo en grasas" o "con bajo contenido de grasas".  Evite comprar alimentos procesados o  envasados. Coccin  Para cocinar opte por mtodos con bajo contenido de grasa, como hornear, hervir, grillar y asar.  Cocine con pequeas cantidades de grasas saludables, como aceite de oliva, aceite de semilla de uva, aceite de canola o girasol. Qu alimentos se recomiendan?   Todas las frutas y verduras frescas, congeladas o enlatadas.  Cereales integrales.  Leche y yogur semidescremados y descremados.  Carne magra, aves sin piel, pescado, huevos y legumbres.  Suplementos proteicos con bajo contenido de grasas, en polvo o lquidos.  Hierbas y especias. Qu alimentos no se recomiendan?  Alimentos muy grasos. Entre estos se incluyen productos panificados, comida rpida, cortes de carne con grasa, helados, pan francs, rosquillas dulces, pizza, pan de queso, alimentos cubiertos con manteca, salsas con crema o queso.  Comidas fritas. Se incluyen papas fritas, tempura, pescado rebozado, milanesas de pollo, panes fritos y dulces.  Alimentos con olores fuertes.  Alimentos que causan gases o meteorismo. Resumen  Una dieta de bajo contenido graso puede ser beneficiosa si tiene una afeccin de la vescula biliar o puede hacerla antes y despus de someterse a una ciruga de vescula.  Limite el consumo de grasas a menos del 30% del total de caloras diarias. Esto es casi 60 gramos de grasa si usted ingiere 1800 caloras diarias.  Haga comidas pequeas y frecuentes durante el da en lugar de tres comidas abundantes. Esta informacin no tiene como fin reemplazar el consejo del mdico. Asegrese de hacerle al mdico cualquier pregunta que tenga. Document Revised: 12/14/2016 Document Reviewed: 12/14/2016 Elsevier Patient Education  2020 Elsevier Inc.  

## 2020-03-18 NOTE — MAU Note (Signed)
Pt c/o of abdominal pain for more than hour; rating pain 8/10. Has had vaginal burning and itching for a week. Had SI today. Pt denies VB or LOF.

## 2020-03-19 ENCOUNTER — Encounter (HOSPITAL_COMMUNITY): Payer: Self-pay | Admitting: Obstetrics & Gynecology

## 2020-03-19 ENCOUNTER — Other Ambulatory Visit: Payer: Self-pay

## 2020-03-19 ENCOUNTER — Inpatient Hospital Stay (HOSPITAL_COMMUNITY)
Admission: AD | Admit: 2020-03-19 | Discharge: 2020-03-19 | Disposition: A | Discharge status: Home / Self Care | Payer: Self-pay | Attending: Obstetrics & Gynecology | Admitting: Obstetrics & Gynecology

## 2020-03-19 DIAGNOSIS — O4703 False labor before 37 completed weeks of gestation, third trimester: Secondary | ICD-10-CM | POA: Insufficient documentation

## 2020-03-19 DIAGNOSIS — B373 Candidiasis of vulva and vagina: Secondary | ICD-10-CM

## 2020-03-19 DIAGNOSIS — Z3A31 31 weeks gestation of pregnancy: Secondary | ICD-10-CM

## 2020-03-19 DIAGNOSIS — Z9889 Other specified postprocedural states: Secondary | ICD-10-CM

## 2020-03-19 DIAGNOSIS — O98813 Other maternal infectious and parasitic diseases complicating pregnancy, third trimester: Secondary | ICD-10-CM

## 2020-03-19 DIAGNOSIS — O26893 Other specified pregnancy related conditions, third trimester: Secondary | ICD-10-CM

## 2020-03-19 DIAGNOSIS — Z98891 History of uterine scar from previous surgery: Secondary | ICD-10-CM

## 2020-03-19 DIAGNOSIS — Z3689 Encounter for other specified antenatal screening: Secondary | ICD-10-CM | POA: Insufficient documentation

## 2020-03-19 DIAGNOSIS — O34219 Maternal care for unspecified type scar from previous cesarean delivery: Secondary | ICD-10-CM

## 2020-03-19 DIAGNOSIS — R109 Unspecified abdominal pain: Secondary | ICD-10-CM

## 2020-03-19 LAB — GC/CHLAMYDIA PROBE AMP (~~LOC~~) NOT AT ARMC
Chlamydia: NEGATIVE
Comment: NEGATIVE
Comment: NORMAL
Neisseria Gonorrhea: NEGATIVE

## 2020-03-19 MED ORDER — OXYCODONE-ACETAMINOPHEN 5-325 MG PO TABS: 1.0000 | ORAL_TABLET | Freq: Three times a day (TID) | ORAL | 0 refills | Status: DC | PRN

## 2020-03-19 MED ORDER — TERCONAZOLE 0.8 % VA CREA: 1.0000 | TOPICAL_CREAM | Freq: Every day | VAGINAL | 0 refills | Status: DC

## 2020-03-19 MED ORDER — OXYCODONE-ACETAMINOPHEN 5-325 MG PO TABS
2.0000 | ORAL_TABLET | Freq: Once | ORAL | Status: AC
  Administered 2020-03-19: 2 via ORAL
  Filled 2020-03-19: qty 2

## 2020-03-19 NOTE — Discharge Instructions (Signed)
Dolor abdominal durante el embarazo Abdominal Pain During Pregnancy  El dolor abdominal es comn durante el embarazo y tiene muchas causas posibles. Algunas causas son ms graves que otras, y a Advertising account executive causa se desconoce. El dolor abdominal puede ser un indicio de que est comenzando el [place]. Tambin puede ser ocasionado por el crecimiento y estiramiento de los msculos y ligamentos durante el Psychiatrist. Siempre informe a su mdico si siente dolor abdominal. Siga estas indicaciones en su casa:  No tenga relaciones sexuales ni se coloque nada dentro de la vagina hasta que el dolor haya desaparecido completamente.  Descanse todo lo que pueda [employer] dolor se le haya calmado.  Beba suficiente lquido para Photographer orina de color amarillo plido.  Tome los medicamentos de venta libre y los recetados solamente como se lo haya indicado el mdico.  Oceanographer a todas las visitas de control como se lo haya indicado el mdico. Esto es importante. Comunquese con un mdico si:  El dolor contina o empeora despus de Lawyer.  Siente dolor en la parte inferior del abdomen que: ? Va y viene en intervalos regulares. ? Se extiende a la espalda. ? Es parecido a los Tree surgeon.  Siente dolor o ardor al Geographical information systems officer. Solicite ayuda de inmediato si:  Tiene fiebre o siente escalofros.  Tiene una hemorragia vaginal abundante.  Tiene una prdida de lquido por la vagina.  Elimina tejidos por la vagina.  Vomita o tiene diarrea durante ms de 24horas.  El beb se mueve menos de lo habitual.  Se siente dbil o se desmaya.  Le falta el aire.  Siente dolor intenso en la parte superior del abdomen. Resumen  El dolor abdominal es comn durante el [place] y tiene muchas causas posibles.  Si siente dolor abdominal durante el embarazo, informe al mdico de inmediato.  Siga las indicaciones del mdico para el cuidado en el hogar y concurra a todas las visitas de control como se lo  hayan indicado. Esta informacin no tiene Theme park manager el consejo del mdico. Asegrese de hacerle al mdico cualquier pregunta que tenga. Document Revised: 11/02/2016 Document Reviewed: 11/02/2016 Elsevier Patient Education  2020 [employer].

## 2020-03-19 NOTE — MAU Note (Signed)
Here yesterday for same pain. reports strong cramping/ctx every few minutes. Denies any vag bleeding or discharge. Good fetal movement

## 2020-03-19 NOTE — MAU Provider Note (Signed)
History     CSN: [id number]  Arrival date and time: 03/19/20 1124   First Provider Initiated Contact with Patient 03/19/20 1225      Chief Complaint  Patient presents with  . Contractions   Paula Gamble is a 24 y.o. [id number] at [redacted]w[redacted]d who receives care at [facility].  She presents today for Contractions.  She reports the pain started at 0600 and has been intermittent lasting 10 seconds.  She states she took Tylenol at 0900 without relief. She describes the pain as sharp. She states the pain is worsened with walking and improved with laying on her side. Patient rates the pain a 10/10. She states she does not use a belly support band.   Of note, when provider walked in room and patient tearful, moaning, holding lower abdomen, and reporting "mucho dolor."    OB History    Gravida  4   Para  1   Term  1   Preterm      AB  2   Living  1     SAB  2   TAB      Ectopic      Multiple  0   Live Births  1           Past Medical History:  Diagnosis Date  . Abnormal Pap smear of cervix 01/06/2017   LSIL - Needs repeat 04/2017  . COVID-19 04/2019  . Pyelonephritis     Past Surgical History:  Procedure Laterality Date  . ABDOMINOPLASTY    . CESAREAN SECTION N/A 11/25/2016   Procedure: CESAREAN SECTION;  Surgeon: Lazaro Arms, MD;  Location: [facility] BIRTHING SUITES;  Service: Obstetrics;  Laterality: N/A;  . DILATION AND CURETTAGE OF UTERUS     x2    Family History  Problem Relation Age of Onset  . Diabetes Paternal Uncle   . Cancer Maternal Grandmother   . Diabetes Maternal Grandmother   . Hypertension Maternal Grandmother   . Cancer Paternal Grandmother        liver    Social History   Tobacco Use  . Smoking status: Never Smoker  . Smokeless tobacco: Never Used  Vaping Use  . Vaping Use: Former  Substance Use Topics  . Alcohol use: Not Currently    Comment: not since January  . Drug use: Not Currently    Types: Marijuana    Comment: last used before  09/19/19     Allergies:  Allergies  Allergen Reactions  . Latex Itching    Medications Prior to Admission  Medication Sig Dispense Refill Last Dose  . calcium carbonate (TUMS) 500 MG chewable tablet Chew 1 tablet (200 mg of elemental calcium total) by mouth daily. 30 tablet 1   . famotidine (PEPCID) 20 MG tablet Take 1 tablet (20 mg total) by mouth 2 (two) times daily. 60 tablet 2   . Hyoscyamine Sulfate SL (LEVSIN/SL) 0.125 MG SUBL Place 1 tablet under the tongue every 8 (eight) hours as needed (abdominal cramping). 30 tablet 0   . Prenatal Vit-Fe Fumarate-FA (MULTIVITAMIN-PRENATAL) 27-0.8 MG TABS tablet Take 1 tablet by mouth daily at 12 noon.       Review of Systems  Constitutional: Negative for chills and fever.  Respiratory: Negative for cough and shortness of breath.   Gastrointestinal: Positive for abdominal pain. Negative for nausea and vomiting.  Genitourinary: Positive for vaginal discharge. Negative for difficulty urinating, dysuria, pelvic pain and vaginal bleeding.  Musculoskeletal: Positive for back pain.  Neurological: Negative for dizziness, light-headedness and headaches.   Physical Exam   Blood pressure 114/64, pulse (!) 106, temperature 98.3 F (36.8 C), resp. rate 18, last menstrual period 08/04/2019, currently breastfeeding.  Physical Exam Vitals and nursing note reviewed. Exam conducted with a chaperone present.  Constitutional:      General: She is in acute distress.     Appearance: Normal appearance. She is not ill-appearing.  HENT:     Head: Normocephalic and atraumatic.  Eyes:     Conjunctiva/sclera: Conjunctivae normal.  Cardiovascular:     Rate and Rhythm: Normal rate and regular rhythm.     Heart sounds: Normal heart sounds.  Pulmonary:     Effort: Pulmonary effort is normal.     Breath sounds: Normal breath sounds.  Abdominal:     Palpations: Abdomen is soft.     Tenderness: There is abdominal tenderness. There is no guarding or rebound.      Comments: Gravid; Appears AGA  Horizontal Scar at level of iliac spine.   Genitourinary:    Comments: Speculum Exam: -Normal External Genitalia: Non tender, Small amt thin white discharge at introitus.  -Vaginal Vault: Pink mucosa with good rugae. Moderate amt thick greenish white curdy discharge  -Cervix:Pink, no lesions, cysts, or polyps.  Appears closed. No active bleeding from os. -Bimanual Exam: Dilation: Closed Exam by:: Gerrit Heck, CNM  Skin:    General: Skin is warm and dry.  Neurological:     Mental Status: She is alert and oriented to person, place, and time.  Psychiatric:        Mood and Affect: Mood normal.        Behavior: Behavior normal.        Thought Content: Thought content normal.    Fetal Assessment 140 bpm, Mod Var, -Decels, +Accels Toco: No ctx graphed  MAU Course   Results for orders placed or performed during the hospital encounter of 03/18/20 (from the past 24 hour(s))  Wet prep, genital     Status: Abnormal   Collection Time: 03/18/20  4:03 PM   Specimen: Vaginal  Result Value Ref Range   Yeast Wet Prep HPF POC NONE SEEN NONE SEEN   Trich, Wet Prep NONE SEEN NONE SEEN   Clue Cells Wet Prep HPF POC NONE SEEN NONE SEEN   WBC, Wet Prep HPF POC MANY (A) NONE SEEN   Sperm NONE SEEN   Urinalysis, Routine w reflex microscopic     Status: Abnormal   Collection Time: 03/18/20  4:03 PM  Result Value Ref Range   Color, Urine YELLOW YELLOW   APPearance HAZY (A) CLEAR   Specific Gravity, Urine 1.012 1.005 - 1.030   pH 7.0 5.0 - 8.0   Glucose, UA NEGATIVE NEGATIVE mg/dL   Hgb urine dipstick NEGATIVE NEGATIVE   Bilirubin Urine NEGATIVE NEGATIVE   Ketones, ur NEGATIVE NEGATIVE mg/dL   Protein, ur NEGATIVE NEGATIVE mg/dL   Nitrite NEGATIVE NEGATIVE   Leukocytes,Ua TRACE (A) NEGATIVE   RBC / HPF 0-5 0 - 5 RBC/hpf   WBC, UA 0-5 0 - 5 WBC/hpf   Bacteria, UA NONE SEEN NONE SEEN   Squamous Epithelial / LPF 0-5 0 - 5   Mucus PRESENT   CBC     Status:  Abnormal   Collection Time: 03/18/20  4:40 PM  Result Value Ref Range   WBC 18.1 (H) 4.0 - 10.5 K/uL   RBC 3.68 (L) 3.87 - 5.11 MIL/uL   Hemoglobin 11.6 (L) 12.0 -  15.0 g/dL   HCT 41.6 (L) 36 - 46 %   MCV 97.0 80.0 - 100.0 fL   MCH 31.5 26.0 - 34.0 pg   MCHC 32.5 30.0 - 36.0 g/dL   RDW 38.4 53.6 - 46.8 %   Platelets 178 150 - 400 K/uL   nRBC 0.0 0.0 - 0.2 %  Comprehensive metabolic panel     Status: Abnormal   Collection Time: 03/18/20  4:46 PM  Result Value Ref Range   Sodium 132 (L) 135 - 145 mmol/L   Potassium 3.5 3.5 - 5.1 mmol/L   Chloride 101 98 - 111 mmol/L   CO2 23 22 - 32 mmol/L   Glucose, Bld 82 70 - 99 mg/dL   BUN 5 (L) 6 - 20 mg/dL   Creatinine, Ser 0.32 0.44 - 1.00 mg/dL   Calcium 8.7 (L) 8.9 - 10.3 mg/dL   Total Protein 6.0 (L) 6.5 - 8.1 g/dL   Albumin 2.9 (L) 3.5 - 5.0 g/dL   AST 15 15 - 41 U/L   ALT 12 0 - 44 U/L   Alkaline Phosphatase 90 38 - 126 U/L   Total Bilirubin 0.3 0.3 - 1.2 mg/dL   GFR, Estimated >12 >24 mL/min   Anion gap 8 5 - 15  Lipase, blood     Status: None   Collection Time: 03/18/20  4:46 PM  Result Value Ref Range   Lipase 27 11 - 51 U/L   [place] MFM OB Limited  Result Date: 03/18/2020 ----------------------------------------------------------------------  OBSTETRICS REPORT                       (Signed Final 03/18/2020 08:49 pm) ---------------------------------------------------------------------- Patient Info  ID #:       [id number]                          D.O.B.:  05-Aug-1995 (23 yrs)  Name:       Paula Gamble           Visit Date: 03/18/2020 07:01 pm ---------------------------------------------------------------------- Performed By  Attending:        Ma Rings MD         Ref. Address:      [address] [place],                                                              [place] [address]  Performed By:     Sandi Mealy        [facility]  Referred By:      Duane LopeJENNIFER I                    RASCH NP ---------------------------------------------------------------------- Orders  #  Description                           Code        Ordered By  1  US MFM OB LIMITED                     [id number]    Clayton BiblesSAMANTHA                                                       WEINHOLD ----------------------------------------------------------------------  #  Order #                     Accession #                Episode #  1  [id number]                   [id number]                 [id number] ---------------------------------------------------------------------- Indications  Pelvic pain affecting pregnancy in third        O26.893  trimester  [redacted] weeks gestation of pregnancy                 Z3A.31  History of cesarean delivery, currently         O34.219  pregnant ---------------------------------------------------------------------- Vital Signs                                                 Height:        5'4" ---------------------------------------------------------------------- Fetal Evaluation  Num Of Fetuses:          1  Fetal Heart Rate(bpm):   155  Cardiac Activity:        Observed  Presentation:            Breech  Placenta:                Posterior  P. Cord Insertion:       Visualized  Amniotic Fluid  AFI FV:      Within normal limits  AFI Sum(cm)     %Tile       Largest Pocket(cm)  18.25           68          5.26  RUQ(cm)       RLQ(cm)       LUQ(cm)        LLQ(cm)  5.26          4.23          4.58           4.18 ---------------------------------------------------------------------- OB History  Gravidity:    4         Term:   1         SAB:   2 ---------------------------------------------------------------------- Gestational Age  Best:          31w 3d     Det.  By:  Previous Ultrasound      EDD:   05/17/20                                      (09/26/19) ---------------------------------------------------------------------- Comments  This patient presented to the MAU due to abdominal pain.  A limited ultrasound performed today shows that the fetus is  in the breech presentation.  There was normal amniotic fluid noted. ----------------------------------------------------------------------                   Ma Rings, MD Electronically Signed Final Report   03/18/2020 08:49 pm ----------------------------------------------------------------------  US ABDOMEN LIMITED RUQ (LIVER/GB)  Result Date: 03/18/2020 CLINICAL DATA:  Abdominal pain. Generalized abdominal pain. Pregnant patient. EXAM: ULTRASOUND ABDOMEN LIMITED RIGHT UPPER QUADRANT COMPARISON:  Right upper quadrant ultrasound 02/22/2020 FINDINGS: Gallbladder: Physiologically distended. Layering sludge. Small 4 mm echogenic structure may represent a small gallstone or sludge ball. No gallbladder wall thickening. Presence or absence of sonographic Eulah Pont sign was not reported by the sonographer. Common bile duct: Diameter: 1-2 mm, normal. Liver: No focal lesion identified. Within normal limits in parenchymal echogenicity. Portal vein is patent on color Doppler imaging with normal direction of blood flow towards the liver. Other: No right upper quadrant free fluid. IMPRESSION: 1. Layering gallbladder sludge and a small 4 mm gallstone versus sludge ball. No sonographic findings of acute cholecystitis. 2. No biliary dilatation. 3. Unremarkable sonographic appearance of the liver. Electronically Signed   By: Narda Rutherford M.D.   On: 03/18/2020 19:37    MDM PE Labs: None EFM Pain Medication Assessment and Plan  24 year old [id number]  SIUP at 32.4weeks Cat I FT Abdominal Pain H/O Abdominoplasty H/O C/S Vaginal Candidiasis   -Extensive discussion had regarding anticipated pain and discomfort with pregnancy  state considering abdominal history.  -Reviewed findings from yesterday and informed that extensive evaluation was performed and will not be repeated in that capacity. -Exam performed and findings discussed. -Patient informed that findings significant for yeast and will treat. -Patient offered and accepts pain medication. -Will give dose of Percocet which she received yesterday with good results. -Will reassess.   Cherre Robins MSN, CNM 03/19/2020, 12:25 PM   Reassessment (14:46 PM)  -Patient resting in bed. -Will discharge to home with limited supply of Percocet. -Patient to follow up in office as scheduled.  -Encouraged to call or return to MAU if symptoms worsen or with the onset of new symptoms. -Discharged to home in stable condition.  Cherre Robins MSN, CNM Advanced Practice Provider, [facility]

## 2020-03-27 ENCOUNTER — Other Ambulatory Visit: Payer: Self-pay

## 2020-03-27 ENCOUNTER — Ambulatory Visit (INDEPENDENT_AMBULATORY_CARE_PROVIDER_SITE_OTHER): Payer: Self-pay

## 2020-03-27 VITALS — BP 114/67 | HR 96 | Wt 199.9 lb

## 2020-03-27 DIAGNOSIS — Z3493 Encounter for supervision of normal pregnancy, unspecified, third trimester: Secondary | ICD-10-CM

## 2020-03-27 DIAGNOSIS — Z789 Other specified health status: Secondary | ICD-10-CM

## 2020-03-27 DIAGNOSIS — Z3A32 32 weeks gestation of pregnancy: Secondary | ICD-10-CM

## 2020-03-27 DIAGNOSIS — O26893 Other specified pregnancy related conditions, third trimester: Secondary | ICD-10-CM

## 2020-03-27 DIAGNOSIS — Z98891 History of uterine scar from previous surgery: Secondary | ICD-10-CM

## 2020-03-27 DIAGNOSIS — R109 Unspecified abdominal pain: Secondary | ICD-10-CM

## 2020-03-27 NOTE — Patient Instructions (Addendum)
Infeccin por estreptococo del grupo&nbsp;B durante el embarazo Group B Streptococcus Infection During Pregnancy El estreptococo del grupo B (EGB) es un tipo de bacteria que se encuentra a menudo en las personas sanas. Generalmente se encuentra en el recto, la vagina y los intestinos. En personas saludables y en mujeres no embarazadas, rara vez la bacteria provoca una enfermedad o complicaciones graves. Sin embargo, las mujeres cuya prueba de EGB es positiva durante el embarazo pueden transmitirle la bacteria al beb en el parto. Esto puede provocar una infeccin grave en el beb despus del nacimiento. Las mujeres con EGB tambin pueden tener infecciones durante el embarazo o poco despus del parto. Las infecciones incluyen infecciones de las vas urinarias (IU) o infecciones del tero. Los EGB tambin aumentan el riesgo de complicaciones durante el embarazo, como parto o trabajo de parto prematuros, aborto espontneo o muerte fetal. Se recomienda que todas las embarazadas se hagan pruebas de rutina para determinar la presencia de EGB. Cules son las causas? Esta afeccin es causada por la bacteria denominada Streptococcus agalactiae. Qu incrementa el riesgo? Puede tener un mayor riesgo de contraer una infeccin por EGB durante el embarazo si ya le ocurri en un embarazo previo. Cules son los signos o sntomas? En la mayora de los casos, la infeccin por EGB no causa sntomas en las embarazadas. Si hay sntomas, estos pueden incluir:  Inicio del trabajo de parto antes de la semana 37 de gestacin.  Una infeccin urinaria (IU) o en la vejiga. Esto puede causar fiebre, miccin frecuente o dolor y ardor al orinar.  Fiebre durante el trabajo de parto. Tambin puede haber latido cardaco rpido en la madre o en el beb. Los sntomas poco frecuentes pero graves de una posible infeccin por EGB en las mujeres incluyen:  Infeccin en la sangre (septicemia). Esto puede provocar fiebre, escalofros o  confusin.  Infeccin pulmonar (neumona). Esto puede provocar fiebre, escalofros, tos, respiracin rpida, dolor torcico o dificultad para respirar.  Infeccin en los huesos, las articulaciones, la piel o los tejidos blandos. Cmo se diagnostica? Le pueden realizar exmenes de deteccin de EGB entre la semana 35 y la semana 37 de gestacin. Si tiene sntomas de trabajo de parto prematuro, le pueden realizar los exmenes de deteccin antes. Esta afeccin se diagnostica a travs de los resultados de anlisis de laboratorio de:  Un hisopado del lquido de la vagina y del recto.  Una muestra de orina. Cmo se trata? Esta afeccin se trata con un antibitico. Le pueden administrar antibiticos:  Al comenzar el trabajo de parto o apenas se rompa la bolsa de aguas. El uso de los medicamentos continuar hasta despus del parto. Si tiene un parto por cesrea no necesita antibiticos, salvo que se haya roto la bolsa de aguas.  Para el beb, si necesita tratamiento. El mdico controlar al beb para decidir si necesita antibiticos a fin de prevenir una infeccin grave. Siga estas instrucciones en su casa:  Tome los medicamentos de venta libre y los recetados solamente como se lo haya indicado el mdico.  Tome su antibitico como se lo haya indicado el mdico. No deje de tomar el antibitico aunque comience a sentirse mejor.  Concurra a todas las visitas previas al parto (prenatales) y las visitas de control como se lo haya indicado el mdico. Esto es importante. Comunquese con un mdico si:  Siente dolor o ardor al orinar.  Tiene que orinar con ms frecuencia de lo habitual.  Tiene fiebre o escalofros.  Tiene una secrecin vaginal   con mal olor. Solicite ayuda de inmediato si:  Rompe la bolsa.  Comienza el trabajo de parto.  Siente un dolor intenso en el abdomen.  Tiene dificultad para respirar.  Siente dolor en el pecho. Estos sntomas pueden representar un problema grave que  constituye una emergencia. No espere a ver si los sntomas desaparecen. Solicite atencin mdica de inmediato. Comunquese con el servicio de emergencias de su localidad (911 en los [place]). No conduzca por sus propios medios hasta el hospital. Resumen  El EGB es un tipo de bacteria que se encuentra con frecuencia en personas sanas.  Durante el embarazo, la colonizacin con EGB puede causar complicaciones graves para usted o el beb.  El mdico la examinar entre la semana 35 y 37 de embarazo para determinar si tiene colonizacin de EGB.  Si esto sucede durante el embarazo, el mdico le recomendar antibiticos a travs de una va intravenosa durante el trabajo de parto.  Despus del parto, se evaluar al beb para detectar complicaciones relacionadas con una posible infeccin por EGB, lo cual puede requerir antibiticos para prevenir una infeccin grave. Esta informacin no tiene como fin reemplazar el consejo del mdico. Asegrese de hacerle al mdico cualquier pregunta que tenga. Document Revised: 01/24/2019 Document Reviewed: 01/24/2019 Elsevier Patient Education  2020 Elsevier Inc.  

## 2020-03-27 NOTE — Progress Notes (Signed)
   LOW-RISK PREGNANCY OFFICE VISIT  Patient name: Paula Gamble MRN [id number]  Date of birth: 06-01-1995 Chief Complaint:   Routine Prenatal Visit  Subjective:   Paula Gamble is a 24 y.o. [phone] female at [redacted]w[redacted]d with an Estimated Date of Delivery: 05/17/20 being seen today for ongoing management of a low-risk pregnancy aeb has Supervision of low-risk pregnancy; Language barrier; History of COVID-19; History of pyelonephritis during pregnancy; History of C-section; Obesity during pregnancy; GBS (group B streptococcus) UTI complicating pregnancy; Anemia in pregnancy; History of ileus; and Gallstone on their problem list.  Patient presents today without complaint. Patient endorses fetal movement and lower abdominal pain.  She reports she is not taking anything for the pain as she ran out of oxycodone. Patient denies vaginal concerns including abnormal discharge and bleeding. She reports having "a lot of water come out" 3 days ago, but none since. She states she was laying down and getting up to go to the bathroom and experienced the leaking.  Contractions: Not present. Vag. Bleeding: None.  Movement: Present.  Reviewed past medical,surgical, social, obstetrical and family history as well as problem list, medications and allergies.  Objective   Vitals:   03/27/20 1007  BP: 114/67  Pulse: 96  Weight: 199 lb 14.4 oz (90.7 kg)  Body mass index is 34.31 kg/m.  Total Weight Gain:19 lb 14.4 oz (9.027 kg)         Physical Examination:   General appearance: Well appearing, and in no distress  Mental status: Alert, oriented to person, place, and time  Skin: Warm & dry  Cardiovascular: Normal heart rate noted  Respiratory: Normal respiratory effort, no distress  Abdomen: Soft, gravid, nontender, AGA with Fundal height of Fundal Height: 31 cm  Pelvic: Cervical exam deferred           Extremities: Edema: Trace  Fetal Status: Fetal Heart Rate (bpm): 144  Movement: Present   No  results found for this or any previous visit (from the past 24 hour(s)).  Assessment & Plan:  LOW-risk pregnancy of a 24 y.o., [id number] at [redacted]w[redacted]d with an Estimated Date of Delivery: 05/17/20   1. Encounter for supervision of low-risk pregnancy in third trimester -Anticipatory guidance for upcoming. -Reviewed GBS visit in which GBS would not be collected, but STD test would  2. History of C-section -TOLAC consent signed  3. [redacted] weeks gestation of pregnancy -Doing well.   4. Language barrier -Interpreter present for entire visit.   5. Abdominal pain during pregnancy in third trimester -Encouraged rest and relaxation when possible. -Patient requests additional oxycodone for pain. -Informed that no other prescriptions will be provided.  -Discussed usage of tylenol if necessary.     Meds: No orders of the defined types were placed in this encounter.  Labs/procedures today:  Lab Orders  No laboratory test(s) ordered today     Reviewed: Preterm labor symptoms and general obstetric precautions including but not limited to vaginal bleeding, contractions, leaking of fluid and fetal movement were reviewed in detail with the patient.  All questions were answered.  Follow-up: Return in about 2 weeks (around 04/10/2020) for HROB.  No orders of the defined types were placed in this encounter.  Cherre Robins MSN, CNM 03/27/2020

## 2020-04-04 ENCOUNTER — Encounter (HOSPITAL_COMMUNITY): Payer: Self-pay | Admitting: Obstetrics and Gynecology

## 2020-04-04 ENCOUNTER — Other Ambulatory Visit: Payer: Self-pay

## 2020-04-04 ENCOUNTER — Inpatient Hospital Stay (HOSPITAL_COMMUNITY)
Admission: AD | Admit: 2020-04-04 | Discharge: 2020-04-04 | Disposition: A | Discharge status: Home / Self Care | Payer: Self-pay | Attending: Obstetrics and Gynecology | Admitting: Obstetrics and Gynecology

## 2020-04-04 DIAGNOSIS — O99613 Diseases of the digestive system complicating pregnancy, third trimester: Secondary | ICD-10-CM | POA: Insufficient documentation

## 2020-04-04 DIAGNOSIS — Z789 Other specified health status: Secondary | ICD-10-CM

## 2020-04-04 DIAGNOSIS — O99019 Anemia complicating pregnancy, unspecified trimester: Secondary | ICD-10-CM

## 2020-04-04 DIAGNOSIS — Z0371 Encounter for suspected problem with amniotic cavity and membrane ruled out: Secondary | ICD-10-CM | POA: Insufficient documentation

## 2020-04-04 DIAGNOSIS — Z87440 Personal history of urinary (tract) infections: Secondary | ICD-10-CM

## 2020-04-04 DIAGNOSIS — O99820 Streptococcus B carrier state complicating pregnancy: Secondary | ICD-10-CM

## 2020-04-04 DIAGNOSIS — O99213 Obesity complicating pregnancy, third trimester: Secondary | ICD-10-CM | POA: Insufficient documentation

## 2020-04-04 DIAGNOSIS — O99210 Obesity complicating pregnancy, unspecified trimester: Secondary | ICD-10-CM

## 2020-04-04 DIAGNOSIS — K5900 Constipation, unspecified: Secondary | ICD-10-CM | POA: Insufficient documentation

## 2020-04-04 DIAGNOSIS — E669 Obesity, unspecified: Secondary | ICD-10-CM

## 2020-04-04 DIAGNOSIS — Z98891 History of uterine scar from previous surgery: Secondary | ICD-10-CM

## 2020-04-04 DIAGNOSIS — O2340 Unspecified infection of urinary tract in pregnancy, unspecified trimester: Secondary | ICD-10-CM

## 2020-04-04 DIAGNOSIS — D649 Anemia, unspecified: Secondary | ICD-10-CM

## 2020-04-04 DIAGNOSIS — Z3A33 33 weeks gestation of pregnancy: Secondary | ICD-10-CM | POA: Insufficient documentation

## 2020-04-04 DIAGNOSIS — O99013 Anemia complicating pregnancy, third trimester: Secondary | ICD-10-CM

## 2020-04-04 DIAGNOSIS — R109 Unspecified abdominal pain: Secondary | ICD-10-CM | POA: Insufficient documentation

## 2020-04-04 DIAGNOSIS — O34219 Maternal care for unspecified type scar from previous cesarean delivery: Secondary | ICD-10-CM

## 2020-04-04 DIAGNOSIS — Z8616 Personal history of COVID-19: Secondary | ICD-10-CM

## 2020-04-04 DIAGNOSIS — O09293 Supervision of pregnancy with other poor reproductive or obstetric history, third trimester: Secondary | ICD-10-CM

## 2020-04-04 DIAGNOSIS — O26893 Other specified pregnancy related conditions, third trimester: Secondary | ICD-10-CM | POA: Insufficient documentation

## 2020-04-04 LAB — AMNISURE RUPTURE OF MEMBRANE (ROM) NOT AT ARMC: Amnisure ROM: NEGATIVE

## 2020-04-04 MED ORDER — POLYETHYLENE GLYCOL 3350 17 GM/SCOOP PO POWD: 17.0000 g | Freq: Every day | ORAL | 1 refills | Status: DC

## 2020-04-04 MED ORDER — LIDOCAINE HCL 2 % EX GEL: 1.0000 "application " | CUTANEOUS | 0 refills | Status: DC | PRN

## 2020-04-04 MED ORDER — DOCUSATE SODIUM 100 MG PO CAPS: 100.0000 mg | ORAL_CAPSULE | Freq: Two times a day (BID) | ORAL | 2 refills | Status: DC | PRN

## 2020-04-04 MED ORDER — LIDOCAINE HCL URETHRAL/MUCOSAL 2 % EX GEL: 1.0000 "application " | Freq: Once | CUTANEOUS | Status: DC

## 2020-04-04 MED ORDER — FLEET ENEMA 7-19 GM/118ML RE ENEM
1.0000 | ENEMA | Freq: Once | RECTAL | Status: AC
  Administered 2020-04-04: 1 via RECTAL

## 2020-04-04 NOTE — MAU Note (Signed)
Pt reports lower abdominal pain that started at 1700. Rating pain 9/10 and says it's pulsing. Also report leaking of "water" at 0900 today. She denies VB, +FM.

## 2020-04-04 NOTE — MAU Note (Signed)
Pt reports having a bowel movement after receiving 1/2 of enema. Reports having "small pain" now. Provider informed. Report given to Isabel Caprice RN.

## 2020-04-04 NOTE — MAU Provider Note (Signed)
Chief Complaint:  Abdominal Pain and Rupture of Membranes   First Provider Initiated Contact with Patient 04/04/20 2142      HPI: Paula Gamble is a 24 y.o. [id number] at 108w6d by LMP who presents to maternity admissions reporting intermittent lower abdominal pain and leaking fluid. She reports a single episode of clear fluid that soaked her underwear at 9 am today. She has not required a pad after this episode for leaking fluid.  The pain started today and is becoming more severe.  She reports she had a bowel movement yesterday but stool has been hard and infrequent. She reports good fetal movement.  Location: bilateral, lower abdomen Quality: cramping Severity: 10/10 on pain scale Duration: 1 ay Timing: intermittent Modifying factors: none, pt has been taking pain medication for upper abdominal pain/gallstones Associated signs and symptoms: leaking fluid  HPI  Past Medical History: Past Medical History:  Diagnosis Date  . Abnormal Pap smear of cervix 01/06/2017   LSIL - Needs repeat 04/2017  . COVID-19 04/2019  . Pyelonephritis     Past obstetric history: OB History  Gravida Para Term Preterm AB Living  4 1 1   2 1   SAB TAB Ectopic Multiple Live Births  2     0 1    # Outcome Date GA Lbr Len/2nd Weight Sex Delivery Anes PTL Lv  4 Current           3 Term 11/25/16 [redacted]w[redacted]d  3901 g M CS-LTranv EPI  LIV  2 SAB 2016 [redacted]w[redacted]d         1 SAB             Past Surgical History: Past Surgical History:  Procedure Laterality Date  . ABDOMINOPLASTY    . CESAREAN SECTION N/A 11/25/2016   Procedure: CESAREAN SECTION;  Surgeon: 01/26/2017, MD;  Location: [facility] BIRTHING SUITES;  Service: Obstetrics;  Laterality: N/A;  . DILATION AND CURETTAGE OF UTERUS     x2    Family History: Family History  Problem Relation Age of Onset  . Diabetes Paternal Uncle   . Cancer Maternal Grandmother   . Diabetes Maternal Grandmother   . Hypertension Maternal Grandmother   . Cancer Paternal  Grandmother        liver    Social History: Social History   Tobacco Use  . Smoking status: Never Smoker  . Smokeless tobacco: Never Used  Vaping Use  . Vaping Use: Former  Substance Use Topics  . Alcohol use: Not Currently    Comment: not since January  . Drug use: Not Currently    Types: Marijuana    Comment: last used before 09/19/19     Allergies:  Allergies  Allergen Reactions  . Latex Itching    Meds:  No medications prior to admission.    ROS:  Review of Systems  Constitutional: Negative for chills, fatigue and fever.  Eyes: Negative for visual disturbance.  Respiratory: Negative for shortness of breath.   Cardiovascular: Negative for chest pain.  Gastrointestinal: Positive for abdominal pain. Negative for nausea and vomiting.  Genitourinary: Positive for vaginal discharge. Negative for difficulty urinating, dysuria, flank pain, pelvic pain, vaginal bleeding and vaginal pain.  Neurological: Negative for dizziness and headaches.  Psychiatric/Behavioral: Negative.      I have reviewed patient's Past Medical Hx, Surgical Hx, Family Hx, Social Hx, medications and allergies.   Physical Exam   Patient Vitals for the past 24 hrs:  BP Temp Temp src Pulse Resp  SpO2  04/04/20 2329 (!) 93/46 -- -- 97 18 --  04/04/20 2221 -- -- -- -- -- 99 %  04/04/20 2216 -- -- -- -- -- 98 %  04/04/20 2211 -- -- -- -- -- 99 %  04/04/20 2206 -- -- -- -- -- 99 %  04/04/20 2205 -- -- -- -- -- 99 %  04/04/20 2201 -- -- -- -- -- 99 %  04/04/20 2156 -- -- -- -- -- 99 %  04/04/20 2131 (!) 131/57 -- -- 84 -- 99 %  04/04/20 2128 (!) 111/57 -- -- 92 -- --  04/04/20 2126 -- -- -- -- -- 98 %  04/04/20 2121 -- -- -- -- -- 99 %  04/04/20 2116 -- -- -- -- -- 99 %  04/04/20 2111 -- -- -- -- -- 99 %  04/04/20 2106 -- -- -- -- -- 97 %  04/04/20 2105 (!) 109/48 -- -- 98 -- --  04/04/20 2104 -- 98.2 F (36.8 C) Oral 92 18 98 %   Constitutional: Well-developed, well-nourished female in no  acute distress.  Cardiovascular: normal rate Respiratory: normal effort GI: Abd soft, non-tender, gravid appropriate for gestational age.  MS: Extremities nontender, no edema, normal ROM Neurologic: Alert and oriented x 4.  GU: Neg CVAT.  PELVIC EXAM: Cervix pink, visually closed, without lesion, scant white creamy discharge, no pooling with Valsalva,vaginal walls and external genitalia normal   Dilation: Closed Effacement (%): Thick Exam by:: L Leftwich Kirby CNM  FHT:  Baseline 145 , moderate variability, accelerations present, no decelerations Contractions: 1 in 30 minutes on toco, mild to palpation   Labs: Results for orders placed or performed during the hospital encounter of 04/04/20 (from the past 24 hour(s))  Amnisure rupture of membrane (rom)not at [facility]     Status: None   Collection Time: 04/04/20 10:15 PM  Result Value Ref Range   Amnisure ROM NEGATIVE    O/Positive/-- (06/10 1206)  Imaging:    MAU Course/MDM: Orders Placed This Encounter  Procedures  . Amnisure rupture of membrane (rom)not at [facility]  . Discharge patient    Meds ordered this encounter  Medications  . sodium phosphate (FLEET) 7-19 GM/118ML enema 1 enema  . lidocaine (XYLOCAINE) 2 % jelly 1 application  . lidocaine (XYLOCAINE) 2 % jelly    Sig: Apply 1 application topically as needed.    Dispense:  30 mL    Refill:  0    Order Specific Question:   Supervising Provider    Answer:   Conan Bowens [[id number]]  . docusate sodium (COLACE) 100 MG capsule    Sig: Take 1 capsule (100 mg total) by mouth 2 (two) times daily as needed.    Dispense:  30 capsule    Refill:  2    Order Specific Question:   Supervising Provider    Answer:   Conan Bowens [[id number]]  . polyethylene glycol powder (GLYCOLAX/MIRALAX) 17 GM/SCOOP powder    Sig: Take 17 g by mouth daily.    Dispense:  255 g    Refill:  1    Order Specific Question:   Supervising Provider    Answer:   Conan Bowens [[id number]]     NST  reviewed and appropriate for gestational age Negative pooling and ferning so no evidence of ROM No evidence of PTL with closed cervix Hard stool palpable during exam, discussed constipation with pt Offered pt outpatient treatment or treatment in MAU and pt preferred enema in MAU  today After Fleet enema, pt had moderate amount of bowel movement and reported significantly less pain D/C home with Colace and Miralax Rx, dietary changes discussed Pt discharge with strict return precautions.    Assessment: 1. Abdominal pain during pregnancy in third trimester   2. Antepartum anemia   3. Group B Streptococcus urinary tract infection affecting pregnancy, antepartum   4. Language barrier   5. History of COVID-19   6. History of pyelonephritis during pregnancy   7. History of C-section   8. Obesity during pregnancy   9. Constipation during pregnancy in third trimester   10. [redacted] weeks gestation of pregnancy   11. Encounter for suspected premature rupture of membranes, with rupture of membranes not found     Plan: Discharge home Labor precautions and fetal kick counts  Follow-up Information    [facility] Follow up.   Specialty: Obstetrics and Gynecology Why: As scheduled, return to MAU as needed for emergencies. Contact information: [address] [place] [address] [phone]             Allergies as of 04/04/2020      Reactions   Latex Itching      Medication List    STOP taking these medications   calcium carbonate 500 MG chewable tablet Commonly known as: Tums   oxyCODONE-acetaminophen 5-325 MG tablet Commonly known as: PERCOCET/ROXICET     TAKE these medications   docusate sodium 100 MG capsule Commonly known as: COLACE Take 1 capsule (100 mg total) by mouth 2 (two) times daily as needed.   famotidine 20 MG tablet Commonly known as: PEPCID Take 1 tablet (20 mg total) by mouth 2 (two)  times daily.   Hyoscyamine Sulfate SL 0.125 MG Subl Commonly known as: Levsin/SL Place 1 tablet under the tongue every 8 (eight) hours as needed (abdominal cramping).   lidocaine 2 % jelly Commonly known as: XYLOCAINE Apply 1 application topically as needed.   multivitamin-prenatal 27-0.8 MG Tabs tablet Take 1 tablet by mouth daily at 12 noon.   polyethylene glycol powder 17 GM/SCOOP powder Commonly known as: GLYCOLAX/MIRALAX Take 17 g by mouth daily.   terconazole 0.8 % vaginal cream Commonly known as: TERAZOL 3 Place 1 applicator vaginally at bedtime.       Sharen Counter Certified Nurse-Midwife 04/05/2020 1:24 AM

## 2020-04-09 ENCOUNTER — Other Ambulatory Visit: Payer: Self-pay

## 2020-04-09 ENCOUNTER — Ambulatory Visit (INDEPENDENT_AMBULATORY_CARE_PROVIDER_SITE_OTHER): Payer: Self-pay | Admitting: Obstetrics & Gynecology

## 2020-04-09 ENCOUNTER — Other Ambulatory Visit (HOSPITAL_COMMUNITY)
Admission: RE | Admit: 2020-04-09 | Discharge: 2020-04-09 | Disposition: A | Discharge status: Home / Self Care | Payer: Self-pay | Source: Ambulatory Visit | Attending: Obstetrics & Gynecology | Admitting: Obstetrics & Gynecology

## 2020-04-09 VITALS — BP 132/82 | HR 99 | Wt 204.7 lb

## 2020-04-09 DIAGNOSIS — O2340 Unspecified infection of urinary tract in pregnancy, unspecified trimester: Secondary | ICD-10-CM

## 2020-04-09 DIAGNOSIS — B951 Streptococcus, group B, as the cause of diseases classified elsewhere: Secondary | ICD-10-CM

## 2020-04-09 DIAGNOSIS — N898 Other specified noninflammatory disorders of vagina: Secondary | ICD-10-CM | POA: Insufficient documentation

## 2020-04-09 DIAGNOSIS — Z98891 History of uterine scar from previous surgery: Secondary | ICD-10-CM

## 2020-04-09 DIAGNOSIS — Z3490 Encounter for supervision of normal pregnancy, unspecified, unspecified trimester: Secondary | ICD-10-CM

## 2020-04-09 MED ORDER — HYDROCORTISONE ACETATE 25 MG RE SUPP: 25.0000 mg | Freq: Two times a day (BID) | RECTAL | 0 refills | Status: DC

## 2020-04-09 MED ORDER — FLUCONAZOLE 150 MG PO TABS: 150.0000 mg | ORAL_TABLET | Freq: Once | ORAL | 0 refills | Status: AC

## 2020-04-09 NOTE — Progress Notes (Signed)
° °  PRENATAL VISIT NOTE  Subjective:  Paula Gamble is a 24 y.o. [phone] at [redacted]w[redacted]d being seen today for ongoing prenatal care.  She is currently monitored for the following issues for this high-risk pregnancy and has Supervision of low-risk pregnancy; Language barrier; History of COVID-19; History of pyelonephritis during pregnancy; History of C-section; Obesity during pregnancy; GBS (group B streptococcus) UTI complicating pregnancy; Anemia in pregnancy; History of ileus; and Gallstone on their problem list.  Patient reports vaginal discharge for 3 days.  Contractions: Irritability. Vag. Bleeding: None.  Movement: Present. Denies leaking of fluid.   The following portions of the patient's history were reviewed and updated as appropriate: allergies, current medications, past family history, past medical history, past social history, past surgical history and problem list.   Objective:   Vitals:   04/09/20 0921  BP: 132/82  Pulse: 99  Weight: 204 lb 11.2 oz (92.9 kg)    Fetal Status: Fetal Heart Rate (bpm): 130 Fundal Height: 34 cm Movement: Present     General:  Alert, oriented and cooperative. Patient is in no acute distress.  Skin: Skin is warm and dry. No rash noted.   Cardiovascular: Normal heart rate noted  Respiratory: Normal respiratory effort, no problems with respiration noted  Abdomen: Soft, gravid, appropriate for gestational age.  Pain/Pressure: Present     Pelvic: Cervical exam performed in the presence of a chaperone        Extremities: Normal range of motion.  Edema: None  Mental Status: Normal mood and affect. Normal behavior. Normal judgment and thought content.   Assessment and Plan:  Pregnancy: G4P1021 at [redacted]w[redacted]d 1. Encounter for supervision of low-risk pregnancy, antepartum Plans TOLAC  2. Vaginal discharge Whit d/c c/w yeast no evidence of ROM - Cervicovaginal ancillary only( [place])  3. History of C-section TOLAC desired  4. Group B  Streptococcus urinary tract infection affecting pregnancy, antepartum Treat in labor   Preterm labor symptoms and general obstetric precautions including but not limited to vaginal bleeding, contractions, leaking of fluid and fetal movement were reviewed in detail with the patient. Please refer to After Visit Summary for other counseling recommendations.   Return in about 2 weeks (around 04/23/2020).  No future appointments.  Scheryl Darter, MD

## 2020-04-09 NOTE — Patient Instructions (Signed)

## 2020-04-11 LAB — CERVICOVAGINAL ANCILLARY ONLY
Bacterial Vaginitis (gardnerella): NEGATIVE
Candida Glabrata: NEGATIVE
Candida Vaginitis: NEGATIVE
Comment: NEGATIVE
Comment: NEGATIVE
Comment: NEGATIVE
Comment: NEGATIVE
Trichomonas: NEGATIVE

## 2020-04-14 ENCOUNTER — Inpatient Hospital Stay (HOSPITAL_COMMUNITY)
Admission: AD | Admit: 2020-04-14 | Discharge: 2020-04-15 | Disposition: A | Discharge status: Home / Self Care | Payer: Self-pay | Attending: Obstetrics & Gynecology | Admitting: Obstetrics & Gynecology

## 2020-04-14 ENCOUNTER — Other Ambulatory Visit: Payer: Self-pay

## 2020-04-14 ENCOUNTER — Encounter (HOSPITAL_COMMUNITY): Payer: Self-pay | Admitting: Obstetrics & Gynecology

## 2020-04-14 DIAGNOSIS — Z3A35 35 weeks gestation of pregnancy: Secondary | ICD-10-CM | POA: Insufficient documentation

## 2020-04-14 DIAGNOSIS — N898 Other specified noninflammatory disorders of vagina: Secondary | ICD-10-CM

## 2020-04-14 DIAGNOSIS — O26893 Other specified pregnancy related conditions, third trimester: Secondary | ICD-10-CM

## 2020-04-14 DIAGNOSIS — Z0371 Encounter for suspected problem with amniotic cavity and membrane ruled out: Secondary | ICD-10-CM | POA: Insufficient documentation

## 2020-04-14 LAB — URINALYSIS, ROUTINE W REFLEX MICROSCOPIC
Bilirubin Urine: NEGATIVE
Glucose, UA: NEGATIVE mg/dL
Hgb urine dipstick: NEGATIVE
Ketones, ur: NEGATIVE mg/dL
Nitrite: NEGATIVE
Protein, ur: NEGATIVE mg/dL
Specific Gravity, Urine: 1.011 (ref 1.005–1.030)
pH: 7 (ref 5.0–8.0)

## 2020-04-14 NOTE — MAU Provider Note (Addendum)
History     CSN: [id number]  Arrival date and time: 04/14/20 2210   First Provider Initiated Contact with Patient 04/14/20 2326      Chief Complaint  Patient presents with   Vaginal Discharge   Paula Gamble is a 24 y.o. [id number] at [redacted]w[redacted]d who receives care at [facility].  She presents today for Vaginal Discharge. She states she had some mucous discharge today around 2130.  Patient denies pain or contractions and endorses fetal movement.  She denies discharge prior to the mucous discharge. However, she states the mucous was mixed with water.  Patient denies vaginal itching, burning, or odor.  Patient endorses sexual activity 3 days ago.    OB History    Gravida  4   Para  1   Term  1   Preterm      AB  2   Living  1     SAB  2   TAB      Ectopic      Multiple  0   Live Births  1           Past Medical History:  Diagnosis Date   Abnormal Pap smear of cervix 01/06/2017   LSIL - Needs repeat 04/2017   COVID-19 04/2019   Pyelonephritis     Past Surgical History:  Procedure Laterality Date   ABDOMINOPLASTY     CESAREAN SECTION N/A 11/25/2016   Procedure: CESAREAN SECTION;  Surgeon: Lazaro Arms, MD;  Location: [facility] BIRTHING SUITES;  Service: Obstetrics;  Laterality: N/A;   DILATION AND CURETTAGE OF UTERUS     x2    Family History  Problem Relation Age of Onset   Diabetes Paternal Uncle    Cancer Maternal Grandmother    Diabetes Maternal Grandmother    Hypertension Maternal Grandmother    Cancer Paternal Grandmother        liver    Social History   Tobacco Use   Smoking status: Never Smoker   Smokeless tobacco: Never Used  Vaping Use   Vaping Use: Former  Substance Use Topics   Alcohol use: Not Currently    Comment: not since January   Drug use: Not Currently    Types: Marijuana    Comment: last used before 09/19/19     Allergies:  Allergies  Allergen Reactions   Latex Itching    Medications Prior to Admission   Medication Sig Dispense Refill Last Dose   acetaminophen (TYLENOL) 500 MG tablet Take 1,000 mg by mouth every 6 (six) hours as needed.   Past Week at Unknown time   docusate sodium (COLACE) 100 MG capsule Take 1 capsule (100 mg total) by mouth 2 (two) times daily as needed. 30 capsule 2 04/13/2020 at Unknown time   famotidine (PEPCID) 20 MG tablet Take 1 tablet (20 mg total) by mouth 2 (two) times daily. 60 tablet 2 04/14/2020 at Unknown time   hydrocortisone (ANUSOL-HC) 25 MG suppository Place 1 suppository (25 mg total) rectally 2 (two) times daily. 12 suppository 0 04/13/2020 at Unknown time   Prenatal Vit-Fe Fumarate-FA (MULTIVITAMIN-PRENATAL) 27-0.8 MG TABS tablet Take 1 tablet by mouth daily at 12 noon.   04/13/2020 at Unknown time   Hyoscyamine Sulfate SL (LEVSIN/SL) 0.125 MG SUBL Place 1 tablet under the tongue every 8 (eight) hours as needed (abdominal cramping). (Patient not taking: Reported on 04/09/2020) 30 tablet 0    lidocaine (XYLOCAINE) 2 % jelly Apply 1 application topically as needed. (Patient not taking: Reported  on 04/09/2020) 30 mL 0    polyethylene glycol powder (GLYCOLAX/MIRALAX) 17 GM/SCOOP powder Take 17 g by mouth daily. 255 g 1 Unknown at Unknown time    Review of Systems  Eyes: Negative for visual disturbance.  Gastrointestinal: Positive for nausea (None currently). Negative for abdominal pain and vomiting.  Genitourinary: Positive for pelvic pain (Pressure) and vaginal discharge. Negative for difficulty urinating, dysuria and vaginal bleeding.  Neurological: Negative for dizziness, light-headedness and headaches.   Physical Exam   Blood pressure (!) 117/56, pulse (!) 101, temperature 98.3 F (36.8 C), temperature source Oral, resp. rate 17, height 5\' 5"  (1.651 m), weight 94.2 kg, last menstrual period 08/04/2019, SpO2 99 %, currently breastfeeding.  Physical Exam Vitals and nursing note reviewed. Exam conducted with a chaperone present.  Constitutional:       Appearance: Normal appearance.  HENT:     Head: Normocephalic and atraumatic.  Eyes:     Conjunctiva/sclera: Conjunctivae normal.  Cardiovascular:     Rate and Rhythm: Normal rate and regular rhythm.  Pulmonary:     Effort: Pulmonary effort is normal. No respiratory distress.     Breath sounds: Normal breath sounds.  Abdominal:     General: Bowel sounds are normal.     Tenderness: There is no abdominal tenderness.     Comments: Gravid  Genitourinary:    General: Normal vulva.     Comments: Uterus: gravid, S=D, SE: cervix is smooth, pink, no lesions, small amt of mucoid, white vaginal d/c -- fern slide obtained, cervix visually closed/long, no CMT or friability, no adnexal tenderness  Musculoskeletal:        General: Normal range of motion.     Cervical back: Normal range of motion.  Skin:    General: Skin is warm and dry.  Neurological:     Mental Status: She is alert and oriented to person, place, and time.  Psychiatric:        Mood and Affect: Mood normal.        Behavior: Behavior normal.        Thought Content: Thought content normal.   PE done with help from [employer] Video Spanish Interpreter, 08/06/2019 [phone] used for entire visit   Fetal Assessment (REACTIVE) 120 bpm, Mod Var, -Decels, +Accels Toco: No Ctx graphed  MAU Course   Results for orders placed or performed during the hospital encounter of 04/14/20 (from the past 24 hour(s))  Urinalysis, Routine w reflex microscopic Urine, Clean Catch     Status: Abnormal   Collection Time: 04/14/20 10:42 PM  Result Value Ref Range   Color, Urine YELLOW YELLOW   APPearance CLEAR CLEAR   Specific Gravity, Urine 1.011 1.005 - 1.030   pH 7.0 5.0 - 8.0   Glucose, UA NEGATIVE NEGATIVE mg/dL   Hgb urine dipstick NEGATIVE NEGATIVE   Bilirubin Urine NEGATIVE NEGATIVE   Ketones, ur NEGATIVE NEGATIVE mg/dL   Protein, ur NEGATIVE NEGATIVE mg/dL   Nitrite NEGATIVE NEGATIVE   Leukocytes,Ua TRACE (A) NEGATIVE   RBC  / HPF 0-5 0 - 5 RBC/hpf   WBC, UA 0-5 0 - 5 WBC/hpf   Bacteria, UA RARE (A) NONE SEEN   Squamous Epithelial / LPF 0-5 0 - 5   Mucus PRESENT   Wet prep, genital     Status: None   Collection Time: 04/14/20 11:49 PM  Result Value Ref Range   Yeast Wet Prep HPF POC NONE SEEN NONE SEEN   Trich, Wet Prep NONE SEEN NONE  SEEN   Clue Cells Wet Prep HPF POC NONE SEEN NONE SEEN   WBC, Wet Prep HPF POC NONE SEEN NONE SEEN   Sperm NONE SEEN    No results found.  MDM PE Labs: Wet prep, Fern EFM Assessment and Plan  24 year old G71P1021  SIUP at 35.2weeks Cat I FT Vaginal Discharge Language Barrier  -POC Reviewed. -Provider called out of room and nurse instructed to collect cultures. -Will await results.   -Interpretations completed with status: Maurine Minister [id number]  Cherre Robins MSN, CNM 04/14/2020, 11:26 PM   No leakage of amniotic fluid into vagina - Plan: Discharge patient - Reassurance given that water is not broken - Advised to keep scheduled appt with [facility] on 04/23/2020 - Patient verbalized an understanding of the plan of care and agrees.   Raelyn Mora, CNM  04/15/2020 1:25 AM

## 2020-04-14 NOTE — MAU Note (Signed)
..  Paula Gamble is a 24 y.o. at [redacted]w[redacted]d here in MAU reporting: mucous-like discharge with leaking of fluid. Patient states around 10pm she saw her underwear had a wet spot with mucous. Patient is unsure if she has continued to leak since then. She reports she came because she was told once the mucous comes out her water will break shortly after. +FM. Denies vaginal bleeding. Reports occasional mild contracitons Pain score: 1/10 Vitals:   04/14/20 2225  BP: (!) 117/56  Pulse: (!) 101  Resp: 17  Temp: 98.3 F (36.8 C)  SpO2: 99%     FHT:[id number]

## 2020-04-15 ENCOUNTER — Encounter (HOSPITAL_COMMUNITY): Payer: Self-pay | Admitting: Obstetrics & Gynecology

## 2020-04-15 LAB — GC/CHLAMYDIA PROBE AMP (~~LOC~~) NOT AT ARMC
Chlamydia: NEGATIVE
Comment: NEGATIVE
Comment: NORMAL
Neisseria Gonorrhea: NEGATIVE

## 2020-04-15 LAB — WET PREP, GENITAL
Clue Cells Wet Prep HPF POC: NONE SEEN
Sperm: NONE SEEN
Trich, Wet Prep: NONE SEEN
WBC, Wet Prep HPF POC: NONE SEEN
Yeast Wet Prep HPF POC: NONE SEEN

## 2020-04-16 ENCOUNTER — Telehealth: Payer: Self-pay | Admitting: *Deleted

## 2020-04-16 NOTE — Progress Notes (Signed)
Negative test for infection

## 2020-04-16 NOTE — Telephone Encounter (Addendum)
-----   Message from Adam Phenix, MD sent at 04/16/2020  3:14 PM EST ----- Negative test for infection  11/24  1709  Called pt w/[employer] interpreter # [phone] and pt did not answer. Her voice mailbox was not set up yet and a message could not be left for pt. Pt does not have active Mychart account.

## 2020-04-22 NOTE — Telephone Encounter (Signed)
Called patient with assistance of [employer] [place], [place] # [phone].   Returned call to patient to inform her that all her labs work is normal. Patient voiced understanding with no further questions or concerns

## 2020-04-23 ENCOUNTER — Ambulatory Visit (INDEPENDENT_AMBULATORY_CARE_PROVIDER_SITE_OTHER): Payer: Self-pay | Admitting: Family Medicine

## 2020-04-23 ENCOUNTER — Encounter: Payer: Self-pay | Admitting: Family Medicine

## 2020-04-23 ENCOUNTER — Other Ambulatory Visit: Payer: Self-pay

## 2020-04-23 VITALS — BP 118/64 | HR 116 | Wt 205.8 lb

## 2020-04-23 DIAGNOSIS — O99210 Obesity complicating pregnancy, unspecified trimester: Secondary | ICD-10-CM

## 2020-04-23 DIAGNOSIS — O2340 Unspecified infection of urinary tract in pregnancy, unspecified trimester: Secondary | ICD-10-CM

## 2020-04-23 DIAGNOSIS — O99019 Anemia complicating pregnancy, unspecified trimester: Secondary | ICD-10-CM

## 2020-04-23 DIAGNOSIS — B951 Streptococcus, group B, as the cause of diseases classified elsewhere: Secondary | ICD-10-CM

## 2020-04-23 DIAGNOSIS — Z98891 History of uterine scar from previous surgery: Secondary | ICD-10-CM

## 2020-04-23 DIAGNOSIS — Z1331 Encounter for screening for depression: Secondary | ICD-10-CM

## 2020-04-23 DIAGNOSIS — Z789 Other specified health status: Secondary | ICD-10-CM

## 2020-04-23 DIAGNOSIS — Z3493 Encounter for supervision of normal pregnancy, unspecified, third trimester: Secondary | ICD-10-CM

## 2020-04-23 DIAGNOSIS — Z8616 Personal history of COVID-19: Secondary | ICD-10-CM

## 2020-04-23 NOTE — Addendum Note (Signed)
Addended by: Henrietta Dine on: 04/23/2020 02:26 PM   Modules accepted: Orders

## 2020-04-23 NOTE — Progress Notes (Signed)
   Subjective:  Paula Gamble is a 24 y.o. [phone] at [redacted]w[redacted]d being seen today for ongoing prenatal care.  She is currently monitored for the following issues for this high-risk pregnancy and has Supervision of low-risk pregnancy; Language barrier; History of COVID-19; History of pyelonephritis during pregnancy; History of C-section; Obesity during pregnancy; GBS (group B streptococcus) UTI complicating pregnancy; Anemia in pregnancy; History of ileus; and Gallstone on their problem list.  Patient reports being generally uncomfortable.  Contractions: Irritability. Vag. Bleeding: None.  Movement: Present. Denies leaking of fluid.   Does not want to TOLAC anymore, would rather have a cesarean  The following portions of the patient's history were reviewed and updated as appropriate: allergies, current medications, past family history, past medical history, past social history, past surgical history and problem list. Problem list updated.  Objective:   Vitals:   04/23/20 1122  BP: 118/64  Pulse: (!) 116  Weight: 205 lb 12.8 oz (93.4 kg)    Fetal Status: Fetal Heart Rate (bpm): 145   Movement: Present     General:  Alert, oriented and cooperative. Patient is in no acute distress.  Skin: Skin is warm and dry. No rash noted.   Cardiovascular: Normal heart rate noted  Respiratory: Normal respiratory effort, no problems with respiration noted  Abdomen: Soft, gravid, appropriate for gestational age. Pain/Pressure: Present     Pelvic: Vag. Bleeding: None Vag D/C Character: Watery   Cervical exam deferred        Extremities: Normal range of motion.  Edema: Trace  Mental Status: Normal mood and affect. Normal behavior. Normal judgment and thought content.   Urinalysis:      Assessment and Plan:  Pregnancy: G4P1021 at [redacted]w[redacted]d  1. Encounter for supervision of low-risk pregnancy in third trimester BP and FHR normal Discussed contraception, thinking about BTL as she currently does not desire  anymore children, but after discussion of LARC options elects for hormonal IUD placement at time of CS  2. Language barrier Spanish  3. History of COVID-19 2020  4. History of C-section Arrest of dilation 2018 at 5.5 cm Also has hx of abdominoplasty TOLAC consent signed but today reports she has been having second thoughts for two weeks, no longer wants to [facility] Discussed risks and benefits, encouraged to [facility] but firm in her decision Message sent to schedule CS+hormonal IUD placement  5. Obesity during pregnancy   6. Antepartum anemia resolved  7. Group B Streptococcus urinary tract infection affecting pregnancy, antepartum    Preterm labor symptoms and general obstetric precautions including but not limited to vaginal bleeding, contractions, leaking of fluid and fetal movement were reviewed in detail with the patient. Please refer to After Visit Summary for other counseling recommendations.  Return in 1 week (on 04/30/2020).   [name], MD

## 2020-04-23 NOTE — Patient Instructions (Signed)
 Tercer trimestre de embarazo Third Trimester of Pregnancy El tercer trimestre comprende desde la semana28 hasta la semana40 (desde el mes7 hasta el mes9). El tercer trimestre es un perodo en el que el beb en gestacin (feto) crece rpidamente. Hacia el final del noveno mes, el feto mide alrededor de 20pulgadas (45cm) de largo y pesa entre 6 y 10 libras (2,700 y 4,500kg). Cambios en el cuerpo durante el tercer trimestre Su organismo continuar atravesando por muchos cambios durante el embarazo. Estos cambios varan de una mujer a otra. Durante el tercer trimestre:  Seguir aumentando de peso. Es de esperar que aumente entre 25 y 35libras (11 y 16kg) hacia el final del embarazo.  Podrn aparecer las primeras estras en las caderas, el abdomen y las mamas.  Puede tener necesidad de orinar con ms frecuencia porque el feto baja hacia la pelvis y ejerce presin sobre la vejiga.  Puede desarrollar o continuar teniendo acidez estomacal. Esto se debe a que el aumento de las hormonas hace que los msculos en el tubo digestivo trabajen ms lentamente.  Puede desarrollar o continuar teniendo estreimiento debido a que el aumento de las hormonas ralentiza la digestin y hace que los msculos que empujan los desechos a travs de los intestinos se relajen.  Puede desarrollar hemorroides. Estas son venas hinchadas (venas varicosas) en el recto que pueden causar picazn o dolor.  Puede desarrollar venas hinchadas y abultadas (venas varicosas) en las piernas.  Puede presentar ms dolor en la pelvis, la espalda o los muslos. Esto se debe al aumento de peso y al aumento de las hormonas que relajan las articulaciones.  Tal vez haya cambios en el cabello. Esto cambios pueden incluir su engrosamiento, crecimiento rpido y cambios en la textura. Adems, a algunas mujeres se les cae el cabello durante o despus del embarazo, o tienen el cabello seco o fino. Lo ms probable es que el cabello se le  normalice despus del nacimiento del beb.  Sus pechos seguirn creciendo y se pondrn cada vez ms sensibles. Un lquido amarillo (calostro) puede salir de sus pechos. Esta es la primera leche que usted produce para su beb.  El ombligo puede salir hacia afuera.  Puede observar que se le hinchan las manos, el rostro o los tobillos.  Puede presentar un aumento del hormigueo o entumecimiento en las manos, brazos y piernas. La piel de su vientre tambin puede sentirse entumecida.  Puede sentir que le falta el aire debido a que se expande el tero.  Puede tener ms problemas para dormir. Esto puede deberse al tamao de su vientre, una mayor necesidad de orinar y un aumento en el metabolismo de su cuerpo.  Puede notar que el feto "baja" o lo siente ms bajo, en el abdomen (aligeramiento).  Puede tener un aumento de la secrecin vaginal.  Puede notar que las articulaciones se sienten flojas y puede sentir dolor alrededor del hueso plvico. Qu debe esperar en las visitas prenatales Le harn exmenes prenatales cada 2semanas hasta la semana36. A partir de ese momento le harn los exmenes semanales. Durante una visita prenatal de rutina:  La pesarn para asegurarse de que usted y el beb estn creciendo normalmente.  Le tomarn la presin arterial.  Le medirn el abdomen para controlar el desarrollo del beb.  Se escucharn los latidos cardacos fetales.  Se evaluarn los resultados de los estudios solicitados en visitas anteriores.  Le revisarn el cuello del tero cuando est prxima la fecha de parto para controlar si el cuello   uterino se ha afinado o adelgazado (borrado).  Le harn una prueba de estreptococos del grupo B. Esto sucede entre las semanas 35 y 37. El mdico puede preguntarle lo siguiente:  Cmo le gustara que fuera el parto.  Cmo se siente.  Si siente los movimientos del beb.  Si ha tenido sntomas anormales, como prdida de lquido, sangrado, dolores de  cabeza intensos o clicos abdominales.  Si est consumiendo algn producto que contenga tabaco, como cigarrillos, tabaco de mascar y cigarrillos electrnicos.  Si tiene alguna pregunta. Otros exmenes o estudios de deteccin que pueden realizarse durante el tercer trimestre incluyen lo siguiente:  Anlisis de sangre para controlar los niveles de hierro (anemia).  Controles fetales para determinar su salud, nivel de actividad y crecimiento. Si tiene alguna enfermedad o hay problemas durante el embarazo, le harn estudios.  Prueba sin estrs. Esta prueba verifica la salud de su beb y se utiliza para detectar signos de problemas, tales como si el beb no est recibiendo suficiente oxgeno. Durante esta prueba, se coloca un cinturn alrededor de su vientre. Al moverse el beb, se controla su frecuencia cardaca. Qu es el falso trabajo de parto? El falso trabajo de parto es una afeccin en la que se sienten pequeos e irregulares espasmos de los msculos del tero (contracciones) que generalmente desaparecen al hacer reposo, cambiar de posicin o al beber agua. Estas contracciones se llaman contracciones de Braxton Hicks. Las contracciones pueden durar horas, das o incluso semanas, antes de que el verdadero trabajo de parto se inicie. Si las contracciones ocurren a intervalos regulares, se vuelven ms frecuentes, aumentan en intensidad o se vuelven dolorosas, debera ver al mdico.  Cules son los signos del trabajo de parto?  Clicos abdominales.  Contracciones regulares que comienzan en intervalos de 10 minutos y se vuelven ms fuertes y ms frecuentes con el tiempo.  Contracciones que comienzan en la parte superior del tero y se extienden hacia abajo, a la zona inferior del abdomen y la espalda.  Aumento de la presin en la pelvis y dolor latente en la espalda.  Una secrecin de mucosidad acuosa o con sangre que sale de la vagina.  Prdida de lquido amnitico. Esto tambin se conoce  como "ruptura de la bolsa de las aguas". Esto puede ser un chorro o un goteo constante y lento de lquido. Informe a su mdico si tiene un color u olor extrao. Si tiene alguno de estos signos, llame a su mdico de inmediato, incluso si es antes de la fecha de parto. Siga estas indicaciones en su casa: Medicamentos  Siga las indicaciones del mdico en relacin con el uso de medicamentos. Durante el embarazo, hay medicamentos que pueden tomarse y otros que no.  Tome vitaminas prenatales que contengan por lo menos 600microgramos (?g) de cido flico.  Si est estreida, tome un laxante suave, si el mdico lo autoriza. Qu debe comer y beber   Lleve una dieta equilibrada que incluya gran cantidad de frutas y verduras frescas, cereales integrales, buenas fuentes de protenas como carnes magras, huevos o tofu, y lcteos descremados. El mdico la ayudar a determinar la cantidad de peso que puede aumentar.  No coma carne cruda ni quesos sin cocinar. Estos elementos contienen grmenes que pueden causar defectos congnitos en el beb.  Si no consume muchos alimentos con calcio, hable con su mdico sobre si debera tomar un suplemento diario de calcio.  La ingesta diaria de cuatro o cinco comidas pequeas en lugar de tres comidas abundantes.  Limite   el consumo de alimentos con alto contenido de grasas y azcares procesados, como alimentos fritos o dulces.  Para evitar el estreimiento: ? Bebe suficiente lquido para mantener la orina clara o de color amarillo plido. ? Consuma alimentos ricos en fibra, como frutas y verduras frescas, cereales integrales y frijoles. Actividad  Haga ejercicio solamente como se lo haya indicado el mdico. La mayora de las mujeres pueden continuar su rutina de ejercicios durante el embarazo. Intente realizar como mnimo 30minutos de actividad fsica por lo menos 5das a la semana. Deje de hacer ejercicio si experimenta contracciones uterinas.  Evite levantar  pesos excesivos.  No haga ejercicio en condiciones de calor o humedad extremas, o a grandes alturas.  Use zapatos cmodos de tacn bajo.  Adopte una buena postura.  Puede seguir teniendo relaciones sexuales, excepto que el mdico le diga lo contrario. Alivio del dolor y del malestar  Haga pausas frecuentes y descanse con las piernas elevadas si tiene calambres en las piernas o dolor en la zona lumbar.  Dese baos de asiento con agua tibia para aliviar el dolor o las molestias causadas por las hemorroides. Use una crema para las hemorroides si el mdico la autoriza.  Use un sostn que le brinde buen soporte para prevenir las molestias causadas por la sensibilidad en los pechos.  Si tiene venas varicosas: ? Use pantimedias que brinden soporte o medias de compresin como se lo haya indicado el mdico. ? Eleve los pies durante 15minutos, 3 o 4veces por da. Cuidados prenatales  Escriba sus preguntas. Llvelas cuando concurra a las visitas prenatales.  Concurra a todas las visitas prenatales tal como se lo haya indicado el mdico. Esto es importante. Seguridad  Use el cinturn de seguridad en todo momento mientras conduce.  Haga una lista de los nmeros de telfono de emergencia, que incluya los nmeros de telfono de familiares, amigos, el hospital y los departamentos de polica y bomberos. Instrucciones generales  Evite el contacto con las bandejas sanitarias de los gatos y la tierra que estos animales usan. Estos elementos contienen grmenes que pueden causar defectos congnitos en el beb. Si tiene un gato, pdale a alguien que limpie la caja de arena por usted.  No haga viajes largos excepto que sea absolutamente necesario y solo con la autorizacin de su mdico.  No se d baos de inmersin en agua caliente, baos turcos ni saunas.  No beber alcohol.  No consuma ningn producto que contenga nicotina o tabaco, como cigarrillos y cigarrillos electrnicos. Si necesita ayuda  para dejar de fumar, consulte al mdico.  No use hierbas medicinales ni medicamentos que no le hayan recetado. Estas sustancias qumicas afectan la formacin y el desarrollo del beb.  No se haga duchas vaginales ni use tampones o toallas higinicas perfumadas.  No mantenga las piernas cruzadas durante largos periodos de tiempo.  Para prepararse para la llegada de su beb: ? Tome clases prenatales para entender, practicar, y hacer preguntas sobre el trabajo de parto y el parto. ? Haga un ensayo de la partida al hospital. ? Visite el hospital y recorra el rea de maternidad. ? Pida un permiso de maternidad o paternidad a sus empleadores. ? Organice para que algn familiar o amigo cuide a sus mascotas mientras usted est en el hospital. ? Compre un asiento de seguridad orientado hacia atrs, y asegrese de saber cmo instalarlo en su automvil. ? Prepare el bolso que llevar al hospital. ? Prepare la habitacin del beb. Asegrese de quitar todas las almohadas y   animales de peluche de la cuna del beb para evitar la asfixia.  Visite a su dentista si no lo ha hecho durante el embarazo. Use un cepillo de dientes blando para higienizarse los dientes y psese el hilo dental con suavidad. Comunquese con un mdico si:  No est segura de que est en trabajo de parto o de que ha roto la bolsa de las aguas.  Se siente mareada.  Siente clicos leves, presin en la pelvis o dolor persistente en el abdomen.  Siente dolor en la parte inferior de la espalda.  Tiene nuseas, vmitos o diarrea persistentes.  Observa una secrecin vaginal inusual o con mal olor.  Siente dolor al orinar. Solicite ayuda de inmediato si:  Rompe la bolsa de las aguas antes de la semana 37.  Tiene contracciones regulares en intervalos de menos de 5 minutos antes de la semana 37.  Tiene fiebre.  Tiene una prdida de lquido por la vagina.  Tiene sangrado o pequeas prdidas vaginales.  Tiene dolor o clicos  abdominales intensos.  Baja de peso o sube de peso rpidamente.  Tiene dificultad para respirar y siente dolor de pecho.  Sbitamente se le hinchan mucho el rostro, las manos, los tobillos, los pies o las piernas.  Su beb se mueve menos de 10 veces en 2 horas.  Siente un dolor de cabeza intenso que no se alivia al tomar medicamentos.  Nota cambios en la visin. Resumen  El tercer trimestre comprende desde la semana28 hasta la semana40, es decir, desde el mes7 hasta el mes9. El tercer trimestre es un perodo en el que el beb en gestacin (feto) crece rpidamente.  Durante el tercer trimestre, su incomodidad puede aumentar a medida que usted y su beb continan aumentando de peso. Es posible que tenga dolor abdominal, en las piernas y en la espalda, problemas para dormir y una mayor necesidad de orinar.  Durante el tercer trimestre, sus pechos seguirn creciendo y se pondrn cada vez ms sensibles. Un lquido amarillo (calostro) puede salir de sus pechos. Esta es la primera leche que usted produce para su beb.  El falso trabajo de parto es una afeccin en la que se sienten pequeos e irregulares espasmos de los msculos del tero (contracciones) que a la larga desaparecen. Estas contracciones se llaman contracciones de Braxton Hicks. Las contracciones pueden durar horas, das o incluso semanas, antes de que el verdadero trabajo de parto se inicie.  Los signos del trabajo de parto pueden incluir: calambres abdominales; contracciones regulares que comienzan en intervalos de 10 minutos y se vuelven ms fuertes y ms frecuentes con el tiempo; una secrecin de mucosidad acuosa o con sangre que sale de la vagina; aumento de la presin en la pelvis y dolor latente en la espalda; y prdida de lquido amnitico. Esta informacin no tiene como fin reemplazar el consejo del mdico. Asegrese de hacerle al mdico cualquier pregunta que tenga. Document Revised: 09/21/2016 Document Reviewed:  09/21/2016 Elsevier Patient Education  2020 [employer]   Eleccin del mtodo anticonceptivo Contraception Choices La anticoncepcin, o los mtodos anticonceptivos, hace referencia a los mtodos o dispositivos que evitan el embarazo. Mtodos hormonales Implante anticonceptivo  Un implante anticonceptivo consiste en un tubo plstico delgado que contiene una hormona. Se inserta en la parte superior del brazo. Puede permanecer en el lugar hasta por 3 aos. Inyecciones de progestina sola Las inyecciones de progestina sola contienen progestina, una forma sinttica de la hormona progesterona. Un mdico las administra cada 3 meses. Pldoras anticonceptivas    Las pldoras anticonceptivas son pastillas que contienen hormonas que evitan el embarazo. Deben tomarse una vez al da, preferentemente a la misma hora cada da. Parches anticonceptivos  El parche anticonceptivo contiene hormonas que evitan el embarazo. Se coloca en la piel, debe cambiarse una vez a la semana durante tres semanas y debe retirarse en la cuarta semana. Se necesita una receta para utilizar este mtodo anticonceptivo. Anillo vaginal  Un anillo vaginal contiene hormonas que evitan el embarazo. Se coloca en la vagina durante tres semanas y se retira en la cuarta semana. Luego se repite el proceso con un anillo nuevo. Se necesita una receta para utilizar este mtodo anticonceptivo. Anticonceptivo de emergencia Los anticonceptivos de emergencia son mtodos para evitar un embarazo despus de tener sexo sin proteccin. Vienen en forma de pldora y pueden tomarse hasta 5 das despus de tener sexo. Funcionan mejor cuando se toman lo ms pronto posible luego de tener sexo. La mayora de los anticonceptivos de emergencia estn disponibles sin receta mdica. Este mtodo no debe utilizarse como el nico mtodo anticonceptivo. Mtodos de barrera Preservativo masculino  Un preservativo masculino es una vaina delgada que se coloca sobre el  pene durante el sexo. Los preservativos evitan que el esperma ingrese en el cuerpo de la mujer. Pueden utilizarse con un espermicida para aumentar la efectividad. Deben desecharse luego de su uso. Preservativo femenino  Un preservativo femenino es una vaina blanda y holgada que se coloca en la vagina antes de tener sexo. El preservativo evita que el esperma ingrese en el cuerpo de la mujer. Deben desecharse luego de su uso. Diafragma  Un diafragma es una barrera blanda con forma de cpula. Se inserta en la vagina antes del sexo, junto con un espermicida. El diafragma bloquea el ingreso de esperma en el tero, y el espermicida mata a los espermatozoides. El diafragma debe permanecer en la vagina durante 6 a 8 horas despus de tener sexo y debe retirarse en el plazo de las 24 horas. Un diafragma es recetado y colocado por un mdico. Debe reemplazarse cada 1 a 2 aos, despus de dar a luz, de aumentar ms de 15lb (6,8kg) y de una ciruga plvica. Capuchn cervical  Un capuchn cervical es una copa redonda y blanda de ltex o plstico que se coloca en el cuello uterino. Se inserta en la vagina antes del sexo, junto con un espermicida. Bloquea el ingreso del esperma en el tero. El capuchn debe permanecer en el lugar durante 6 a 8 horas despus de tener sexo y debe retirarse en el plazo de las 48 horas. Un capuchn cervical debe ser recetado y colocado por un mdico. Debe reemplazarse cada 2aos. Esponja  Una esponja es una pieza blanda y circular de espuma de poliuretano que contiene espermicida. La esponja ayuda a bloquear el ingreso de esperma en el tero, y el espermicida mata a los espermatozoides. Para utilizarla, debe humedecerla e insertarla en la vagina. Debe insertarse antes de tener sexo, debe permanecer dentro al menos durante 6 horas despus de tener sexo y debe retirarse y desecharse en el plazo de las 30 horas. Espermicidas Los espermicidas son sustancias qumicas que matan o bloquean al  esperma y no lo dejan ingresar al cuello uterino y al tero. Vienen en forma de crema, gel, supositorio, espuma o comprimido. Un espermicida debe insertarse en la vagina con un aplicador al menos 10 o 15 minutos antes de tener sexo para dar tiempo a que surta efecto. El proceso debe repetirse cada vez que   tenga sexo. Los espermicidas no requieren receta mdica. Anticonceptivos intrauterinos Dispositivo intrauterino (DIU). Un DIU es un dispositivo en forma de T que se coloca en el tero. Existen dos tipos:  DIU hormonal.Este tipo contiene progestina, una forma sinttica de la hormona progesterona. Este tipo puede permanecer colocado durante 3 a 5 aos.  DIU de cobre.Este tipo est recubierto con un alambre de cobre. Puede permanecer colocado durante 10 aos.  Mtodos anticonceptivos permanentes Ligadura de trompas en la mujer En este mtodo, se sellan, atan u obstruyen las trompas de Falopio durante una ciruga para evitar que el vulo descienda hacia el tero. Esterilizacin histeroscpica En este mtodo, se coloca un implante pequeo y flexible dentro de cada trompa de Falopio. Los implantes hacen que se forme un tejido cicatricial en las trompas de Falopio y que las obstruya para que el espermatozoide no pueda llegar al vulo. El procedimiento demora alrededor de 3 meses para que sea efectivo. Debe utilizarse otro mtodo anticonceptivo durante esos 3 meses. Esterilizacin masculina Este es un procedimiento que consiste en atar los conductos que transportan el esperma (vasectoma). Luego del procedimiento, el hombre puede eyacular lquido (semen). Mtodos de planificacin natural Planificacin familiar natural En este mtodo, la pareja no tiene sexo durante los das en que la mujer podra quedar embarazada. Mtodo calendario Esto significa realizar un seguimiento de la duracin de cada ciclo menstrual, identificar los das en los que se puede producir un embarazo y no tener sexo durante esos  das. Mtodo de la ovulacin En este mtodo, la pareja evita tener sexo durante la ovulacin. Mtodo sintotrmico Este mtodo implica no tener sexo durante la ovulacin. Normalmente, la mujer comprueba la ovulacin al observar cambios en su temperatura y en la consistencia del moco cervical. Mtodo posovulacin En este mtodo, la pareja espera a que finalice la ovulacin para tener sexo. Resumen  La anticoncepcin, o los mtodos anticonceptivos, hace referencia a los mtodos o dispositivos que evitan el embarazo.  Los mtodos anticonceptivos hormonales incluyen implantes, inyecciones, pastillas, parches, anillos vaginales y anticonceptivos de emergencia.  Los mtodos anticonceptivos de barrera pueden incluir preservativos masculinos, preservativos femeninos, diafragmas, capuchones cervicales, esponjas y espermicidas.  Existen dos tipos de DIU (dispositivo intrauterino). Un DIU puede colocarse en el tero de una mujer para evitar el embarazo durante 3 a 5 aos.  La esterilizacin permanente puede realizarse mediante un procedimiento para hombres, mujeres o ambos.  Los mtodos de planificacin familiar natural incluyen no tener sexo durante los das en que la mujer podra quedar embarazada. Esta informacin no tiene como fin reemplazar el consejo del mdico. Asegrese de hacerle al mdico cualquier pregunta que tenga. Document Revised: 06/11/2017 Document Reviewed: 08/30/2016 Elsevier Patient Education  2020 [employer]   Lactancia materna Breastfeeding  Decidir amamantar es una de las mejores elecciones que puede hacer por usted y su beb. Un cambio en las hormonas durante el embarazo hace que las mamas produzcan leche materna en las glndulas productoras de leche. Las hormonas impiden que la leche materna sea liberada antes del nacimiento del beb. Adems, impulsan el flujo de leche luego del nacimiento. Una vez que ha comenzado a amamantar, pensar en el beb, as como la succin o el  llanto, pueden estimular la liberacin de leche de las glndulas productoras de leche. Los beneficios de amamantar Las investigaciones demuestran que la lactancia materna ofrece muchos beneficios de salud para bebs y madres. Adems, ofrece una forma gratuita y conveniente de alimentar al beb. Para el beb  La primera leche (calostro)   ayuda a mejorar el funcionamiento del aparato digestivo del beb.  Las clulas especiales de la leche (anticuerpos) ayudan a combatir las infecciones en el beb.  Los bebs que se alimentan con leche materna tambin tienen menos probabilidades de tener asma, alergias, obesidad o diabetes de tipo 2. Adems, tienen menor riesgo de sufrir el sndrome de muerte sbita del lactante (SMSL).  Los nutrientes de la leche materna son mejores para satisfacer las necesidades del beb en comparacin con la leche maternizada.  La leche materna mejora el desarrollo cerebral del beb. Para usted  La lactancia materna favorece el desarrollo de un vnculo muy especial entre la madre y el beb.  Es conveniente. La leche materna es econmica y siempre est disponible a la temperatura correcta.  La lactancia materna ayuda a quemar caloras. Le ayuda a perder el peso ganado durante el embarazo.  Hace que el tero vuelva al tamao que tena antes del embarazo ms rpido. Adems, disminuye el sangrado (loquios) despus del parto.  La lactancia materna contribuye a reducir el riesgo de tener diabetes de tipo 2, osteoporosis, artritis reumatoide, enfermedades cardiovasculares y cncer de mama, ovario, tero y endometrio en el futuro. Informacin bsica sobre la lactancia Comienzo de la lactancia  Encuentre un lugar cmodo para sentarse o acostarse, con un buen respaldo para el cuello y la espalda.  Coloque una almohada o una manta enrollada debajo del beb para acomodarlo a la altura de la mama (si est sentada). Las almohadas para amamantar se han diseado especialmente a fin de  servir de apoyo para los brazos y el beb mientras amamanta.  Asegrese de que la barriga del beb (abdomen) est frente a la suya.  Masajee suavemente la mama. Con las yemas de los dedos, masajee los bordes exteriores de la mama hacia adentro, en direccin al pezn. Esto estimula el flujo de leche. Si la leche fluye lentamente, es posible que deba continuar con este movimiento durante la lactancia.  Sostenga la mama con 4 dedos por debajo y el pulgar por arriba del pezn (forme la letra "C" con la mano). Asegrese de que los dedos se encuentren lejos del pezn y de la boca del beb.  Empuje suavemente los labios del beb con el pezn o con el dedo.  Cuando la boca del beb se abra lo suficiente, acrquelo rpidamente a la mama e introduzca todo el pezn y la arola, tanto como sea posible, dentro de la boca del beb. La arola es la zona de color que rodea al pezn. ? Debe haber ms arola visible por arriba del labio superior del beb que por debajo del labio inferior. ? Los labios del beb deben estar abiertos y extendidos hacia afuera (evertidos) para asegurar que el beb se prenda de forma adecuada y cmoda. ? La lengua del beb debe estar entre la enca inferior y la mama.  Asegrese de que la boca del beb est en la posicin correcta alrededor del pezn (prendido). Los labios del beb deben crear un sello sobre la mama y estar doblados hacia afuera (invertidos).  Es comn que el beb succione durante 2 a 3 minutos para que comience el flujo de leche materna. Cmo debe prenderse Es muy importante que le ensee al beb cmo prenderse adecuadamente a la mama. Si el beb no se prende adecuadamente, puede causar dolor en los pezones, reducir la produccin de leche materna y hacer que el beb tenga un escaso aumento de peso. Adems, si el beb no se prende adecuadamente al   pezn, puede tragar aire durante la alimentacin. Esto puede causarle molestias al beb. Hacer eructar al beb al cambiar de  mama puede ayudarlo a liberar el aire. Sin embargo, ensearle al beb cmo prenderse a la mama adecuadamente es la mejor manera de evitar que se sienta molesto por tragar aire mientras se alimenta. Signos de que el beb se ha prendido adecuadamente al pezn  Tironea o succiona de modo silencioso, sin causarle dolor. Los labios del beb deben estar extendidos hacia afuera (evertidos).  Se escucha que traga cada 3 o 4 succiones una vez que la leche ha comenzado a fluir (despus de que se produzca el reflejo de eyeccin de la leche).  Hay movimientos musculares por arriba y por delante de sus odos al succionar. Signos de que el beb no se ha prendido adecuadamente al pezn  Hace ruidos de succin o de chasquido mientras se alimenta.  Siente dolor en los pezones. Si cree que el beb no se prendi correctamente, deslice el dedo en la comisura de la boca y colquelo entre las encas del beb para interrumpir la succin. Intente volver a comenzar a amamantar. Signos de lactancia materna exitosa Signos del beb  El beb disminuir gradualmente el nmero de succiones o dejar de succionar por completo.  El beb se quedar dormido.  El cuerpo del beb se relajar.  El beb retendr una pequea cantidad de leche en la boca.  El beb se desprender solo del pecho. Signos que presenta usted  Las mamas han aumentado la firmeza, el peso y el tamao 1 a 3 horas despus de amamantar.  Estn ms blandas inmediatamente despus de amamantar.  Se producen un aumento del volumen de leche y un cambio en su consistencia y color hacia el quinto da de lactancia.  Los pezones no duelen, no estn agrietados ni sangran. Signos de que su beb recibe la cantidad de leche suficiente  Mojar por lo menos 1 o 2paales durante las primeras 24horas despus del nacimiento.  Mojar por lo menos 5 o 6paales cada 24horas durante la primera semana despus del nacimiento. La orina debe ser clara o de color  amarillo plido a los 5das de vida.  Mojar entre 6 y 8paales cada 24horas a medida que el beb sigue creciendo y desarrollndose.  Defeca por lo menos 3 veces en 24 horas a los 5 das de vida. Las heces deben ser blandas y amarillentas.  Defeca por lo menos 3 veces en 24 horas a los 7 das de vida. Las heces deben ser grumosas y amarillentas.  No registra una prdida de peso mayor al 10% del peso al nacer durante los primeros 3 das de vida.  Aumenta de peso un promedio de 4 a 7onzas (113 a 198g) por semana despus de los 4 das de vida.  Aumenta de peso, diariamente, de manera uniforme a partir de los 5 das de vida, sin registrar prdida de peso despus de las 2semanas de vida. Despus de alimentarse, es posible que el beb regurgite una pequea cantidad de leche. Esto es normal. Frecuencia y duracin de la lactancia El amamantamiento frecuente la ayudar a producir ms leche y puede prevenir dolores en los pezones y las mamas extremadamente llenas (congestin mamaria). Alimente al beb cuando muestre signos de hambre o si siente la necesidad de reducir la congestin de las mamas. Esto se denomina "lactancia a demanda". Las seales de que el beb tiene hambre incluyen las siguientes:  Aumento del estado de alerta, actividad o inquietud.    Mueve la cabeza de un lado a otro.  Abre la boca cuando se le toca la mejilla o la comisura de la boca (reflejo de bsqueda).  Aumenta las vocalizaciones, tales como sonidos de succin, se relame los labios, emite arrullos, suspiros o chirridos.  Mueve la mano hacia la boca y se chupa los dedos o las manos.  Est molesto o llora. Evite el uso del chupete en las primeras 4 a 6 semanas despus del nacimiento del beb. Despus de este perodo, podr usar un chupete. Las investigaciones demostraron que el uso del chupete durante el primer ao de vida del beb disminuye el riesgo de tener el sndrome de muerte sbita del lactante (SMSL). Permita  que el nio se alimente en cada mama todo lo que desee. Cuando el beb se desprende o se queda dormido mientras se est alimentando de la primera mama, ofrzcale la segunda. Debido a que, con frecuencia, los recin nacidos estn somnolientos las primeras semanas de vida, es posible que deba despertar al beb para alimentarlo. Los horarios de lactancia varan de un beb a otro. Sin embargo, las siguientes reglas pueden servir como gua para ayudarla a garantizar que el beb se alimenta adecuadamente:  Se puede amamantar a los recin nacidos (bebs de 4 semanas o menos de vida) cada 1 a 3 horas.  No deben transcurrir ms de 3 horas durante el da o 5 horas durante la noche sin que se amamante a los recin nacidos.  Debe amamantar al beb un mnimo de 8 veces en un perodo de 24 horas. Extraccin de leche materna     La extraccin y el almacenamiento de la leche materna le permiten asegurarse de que el beb se alimente exclusivamente de su leche materna, aun en momentos en los que no puede amamantar. Esto tiene especial importancia si debe regresar al trabajo en el perodo en que an est amamantando o si no puede estar presente en los momentos en que el beb debe alimentarse. Su asesor en lactancia puede ayudarla a encontrar un mtodo de extraccin que funcione mejor para usted y orientarla sobre cunto tiempo es seguro almacenar leche materna. Cmo cuidar las mamas durante la lactancia Los pezones pueden secarse, agrietarse y doler durante la lactancia. Las siguientes recomendaciones pueden ayudarla a mantener las mamas humectadas y sanas:  Evite usar jabn en los pezones.  Use un sostn de soporte diseado especialmente para la lactancia materna. Evite usar sostenes con aro o sostenes muy ajustados (sostenes deportivos).  Seque al aire sus pezones durante 3 a 4minutos despus de amamantar al beb.  Utilice solo apsitos de algodn en el sostn para absorber las prdidas de leche. La prdida de  un poco de leche materna entre las tomas es normal.  Utilice lanolina sobre los pezones luego de amamantar. La lanolina ayuda a mantener la humedad normal de la piel. La lanolina pura no es perjudicial (no es txica) para el beb. Adems, puede extraer manualmente algunas gotas de leche materna y masajear suavemente esa leche sobre los pezones para que la leche se seque al aire. Durante las primeras semanas despus del nacimiento, algunas mujeres experimentan congestin mamaria. La congestin mamaria puede hacer que sienta las mamas pesadas, calientes y sensibles al tacto. El pico de la congestin mamaria ocurre en el plazo de los 3 a 5 das despus del parto. Las siguientes recomendaciones pueden ayudarla a aliviar la congestin mamaria:  Vace por completo las mamas al amamantar o extraer leche. Puede aplicar calor hmedo en las   mamas (en la ducha o con toallas hmedas para manos) antes de amamantar o extraer leche. Esto aumenta la circulacin y ayuda a que la leche fluya. Si el beb no vaca por completo las mamas cuando lo amamanta, extraiga la leche restante despus de que haya finalizado.  Aplique compresas de hielo sobre las mamas inmediatamente despus de amamantar o extraer leche, a menos que le resulte demasiado incmodo. Haga lo siguiente: ? Ponga el hielo en una bolsa plstica. ? Coloque una toalla entre la piel y la bolsa de hielo. ? Coloque el hielo durante 20minutos, 2 o 3veces por da.  Asegrese de que el beb est prendido y se encuentre en la posicin correcta mientras lo alimenta. Si la congestin mamaria persiste luego de 48 horas o despus de seguir estas recomendaciones, comunquese con su mdico o un asesor en lactancia. Recomendaciones de salud general durante la lactancia  Consuma 3 comidas y 3 colaciones saludables todos los das. Las madres bien alimentadas que amamantan necesitan entre 450 y 500 caloras adicionales por da. Puede cumplir con este requisito al aumentar  la cantidad de una dieta equilibrada que realice.  Beba suficiente agua para mantener la orina clara o de color amarillo plido.  Descanse con frecuencia, reljese y siga tomando sus vitaminas prenatales para prevenir la fatiga, el estrs y los niveles bajos de vitaminas y minerales en el cuerpo (deficiencias de nutrientes).  No consuma ningn producto que contenga nicotina o tabaco, como cigarrillos y cigarrillos electrnicos. El beb puede verse afectado por las sustancias qumicas de los cigarrillos que pasan a la leche materna y por la exposicin al humo ambiental del tabaco. Si necesita ayuda para dejar de fumar, consulte al mdico.  Evite el consumo de alcohol.  No consuma drogas ilegales o marihuana.  Antes de usar cualquier medicamento, hable con el mdico. Estos incluyen medicamentos recetados y de venta libre, como tambin vitaminas y suplementos a base de hierbas. Algunos medicamentos, que pueden ser perjudiciales para el beb, pueden pasar a travs de la leche materna.  Puede quedar embarazada durante la lactancia. Si se desea un mtodo anticonceptivo, consulte al mdico sobre cules son las opciones seguras durante la lactancia. Dnde encontrar ms informacin: Liga internacional La Leche: www.llli.org. Comunquese con un mdico si:  Siente que quiere dejar de amamantar o se siente frustrada con la lactancia.  Sus pezones estn agrietados o sangran.  Sus mamas estn irritadas, sensibles o calientes.  Tiene los siguientes sntomas: ? Dolor en las mamas o en los pezones. ? Un rea hinchada en cualquiera de las mamas. ? Fiebre o escalofros. ? Nuseas o vmitos. ? Drenaje de otro lquido distinto de la leche materna desde los pezones.  Sus mamas no se llenan antes de amamantar al beb para el quinto da despus del parto.  Se siente triste y deprimida.  El beb: ? Est demasiado somnoliento como para comer bien. ? Tiene problemas para dormir. ? Tiene ms de 1 semana  de vida y moja menos de 6 paales en un periodo de 24 horas. ? No ha aumentado de peso a los 5 das de vida.  El beb defeca menos de 3 veces en 24 horas.  La piel del beb o las partes blancas de los ojos se vuelven amarillentas. Solicite ayuda de inmediato si:  El beb est muy cansado (letargo) y no se quiere despertar para comer.  Le sube la fiebre sin causa. Resumen  La lactancia materna ofrece muchos beneficios de salud para bebs y madres.    Intente amamantar a su beb cuando muestre signos tempranos de hambre.  Haga cosquillas o empuje suavemente los labios del beb con el dedo o el pezn para lograr que el beb abra la boca. Acerque el beb a la mama. Asegrese de que la mayor parte de la arola se encuentre dentro de la boca del beb. Ofrzcale una mama y haga eructar al beb antes de pasar a la otra.  Hable con su mdico o asesor en lactancia si tiene dudas o problemas con la lactancia. Esta informacin no tiene como fin reemplazar el consejo del mdico. Asegrese de hacerle al mdico cualquier pregunta que tenga. Document Revised: 08/04/2017 Document Reviewed: 08/30/2016 Elsevier Patient Education  2020 Elsevier Inc.  

## 2020-04-24 ENCOUNTER — Inpatient Hospital Stay (HOSPITAL_COMMUNITY)
Admission: AD | Admit: 2020-04-24 | Discharge: 2020-04-24 | Disposition: A | Discharge status: Home / Self Care | Payer: Self-pay | Attending: Family Medicine | Admitting: Family Medicine

## 2020-04-24 ENCOUNTER — Other Ambulatory Visit: Payer: Self-pay

## 2020-04-24 ENCOUNTER — Encounter (HOSPITAL_COMMUNITY): Payer: Self-pay | Admitting: Obstetrics & Gynecology

## 2020-04-24 DIAGNOSIS — O479 False labor, unspecified: Secondary | ICD-10-CM

## 2020-04-24 DIAGNOSIS — Z8616 Personal history of COVID-19: Secondary | ICD-10-CM

## 2020-04-24 DIAGNOSIS — Z3A36 36 weeks gestation of pregnancy: Secondary | ICD-10-CM | POA: Insufficient documentation

## 2020-04-24 DIAGNOSIS — O99013 Anemia complicating pregnancy, third trimester: Secondary | ICD-10-CM

## 2020-04-24 DIAGNOSIS — Z3689 Encounter for other specified antenatal screening: Secondary | ICD-10-CM | POA: Insufficient documentation

## 2020-04-24 DIAGNOSIS — Z98891 History of uterine scar from previous surgery: Secondary | ICD-10-CM

## 2020-04-24 DIAGNOSIS — O4703 False labor before 37 completed weeks of gestation, third trimester: Secondary | ICD-10-CM | POA: Diagnosis present

## 2020-04-24 DIAGNOSIS — Z87440 Personal history of urinary (tract) infections: Secondary | ICD-10-CM

## 2020-04-24 DIAGNOSIS — O99210 Obesity complicating pregnancy, unspecified trimester: Secondary | ICD-10-CM

## 2020-04-24 DIAGNOSIS — Z3493 Encounter for supervision of normal pregnancy, unspecified, third trimester: Secondary | ICD-10-CM

## 2020-04-24 DIAGNOSIS — Z789 Other specified health status: Secondary | ICD-10-CM

## 2020-04-24 DIAGNOSIS — B951 Streptococcus, group B, as the cause of diseases classified elsewhere: Secondary | ICD-10-CM

## 2020-04-24 DIAGNOSIS — O2340 Unspecified infection of urinary tract in pregnancy, unspecified trimester: Secondary | ICD-10-CM

## 2020-04-24 NOTE — MAU Note (Signed)
Presents with ctxs every 12-15 minutes that began @ 1100 this morning.  Denies VB, but states watery vaginal discharge.  Endorses +FM.

## 2020-04-24 NOTE — MAU Provider Note (Signed)
Paula Gamble is a 24 y.o. [id number] female at [redacted]w[redacted]d  RN Labor check, not seen by provider SVE by RN: Dilation: 1 Effacement (%): Thick Station: Ballotable Exam by:: [employer], RN NST: FHR baseline 150 bpm, Variability: moderate, Accelerations:present, Decelerations:  Absent= Cat I/Reactive Toco: irregular D/C home  [name] MD/MPH 04/24/2020 5:10 PM

## 2020-04-24 NOTE — Discharge Instructions (Signed)

## 2020-04-25 ENCOUNTER — Encounter (HOSPITAL_COMMUNITY): Payer: Self-pay | Admitting: Obstetrics & Gynecology

## 2020-04-25 ENCOUNTER — Inpatient Hospital Stay (HOSPITAL_COMMUNITY)
Admission: AD | Admit: 2020-04-25 | Discharge: 2020-04-25 | Disposition: A | Discharge status: Home / Self Care | Payer: Self-pay | Attending: Obstetrics & Gynecology | Admitting: Obstetrics & Gynecology

## 2020-04-25 DIAGNOSIS — O368130 Decreased fetal movements, third trimester, not applicable or unspecified: Secondary | ICD-10-CM | POA: Diagnosis present

## 2020-04-25 DIAGNOSIS — Z3A36 36 weeks gestation of pregnancy: Secondary | ICD-10-CM | POA: Diagnosis not present

## 2020-04-25 DIAGNOSIS — O4703 False labor before 37 completed weeks of gestation, third trimester: Secondary | ICD-10-CM | POA: Insufficient documentation

## 2020-04-25 DIAGNOSIS — O479 False labor, unspecified: Secondary | ICD-10-CM

## 2020-04-25 DIAGNOSIS — Z3689 Encounter for other specified antenatal screening: Secondary | ICD-10-CM

## 2020-04-25 LAB — POCT FERN TEST: POCT Fern Test: NEGATIVE

## 2020-04-25 NOTE — MAU Provider Note (Signed)
History   [id number]   Chief Complaint  Patient presents with  . Decreased Fetal Movement  . Abdominal Pain    HPI Paula Gamble is a 24 y.o. female  G4P1021 @36 .6 wks here with report of decreased FM and ctx.  Reports onset of ctx yesterday. Frequency was q12 yesterday and q5 min today. Reports leaking of clear fluid since yesterday.  No gush of fluid. No recent sex. She reports no fetal movement since 2am. All other systems negative.    Patient's last menstrual period was 08/04/2019.  OB History  Gravida Para Term Preterm AB Living  4 1 1   2 1   SAB TAB Ectopic Multiple Live Births  2     0 1    # Outcome Date GA Lbr Len/2nd Weight Sex Delivery Anes PTL Lv  4 Current           3 Term 11/25/16 [redacted]w[redacted]d  3901 g M CS-LTranv EPI  LIV  2 SAB 2016 [redacted]w[redacted]d         1 SAB             Past Medical History:  Diagnosis Date  . Abnormal Pap smear of cervix 01/06/2017   LSIL - Needs repeat 04/2017  . COVID-19 04/2019  . Pyelonephritis     Family History  Problem Relation Age of Onset  . Diabetes Paternal Uncle   . Cancer Maternal Grandmother   . Diabetes Maternal Grandmother   . Hypertension Maternal Grandmother   . Cancer Paternal Grandmother        liver    Social History   Socioeconomic History  . Marital status: Married    Spouse name: Not on file  . Number of children: Not on file  . Years of education: Not on file  . Highest education level: Not on file  Occupational History  . Not on file  Tobacco Use  . Smoking status: Never Smoker  . Smokeless tobacco: Never Used  Vaping Use  . Vaping Use: Former  Substance and Sexual Activity  . Alcohol use: Not Currently    Comment: not since January  . Drug use: Not Currently    Types: Marijuana    Comment: last used before 09/19/19   . Sexual activity: Yes    Birth control/protection: None  Other Topics Concern  . Not on file  Social History Narrative  . Not on file   Social Determinants of Health    Financial Resource Strain:   . Difficulty of Paying Living Expenses: Not on file  Food Insecurity: No Food Insecurity  . Worried About February in the Last Year: Never true  . Ran Out of Food in the Last Year: Never true  Transportation Needs: No Transportation Needs  . Lack of Transportation (Medical): No  . Lack of Transportation (Non-Medical): No  Physical Activity:   . Days of Exercise per Week: Not on file  . Minutes of Exercise per Session: Not on file  Stress:   . Feeling of Stress : Not on file  Social Connections:   . Frequency of Communication with Friends and Family: Not on file  . Frequency of Social Gatherings with Friends and Family: Not on file  . Attends Religious Services: Not on file  . Active Member of Clubs or Organizations: Not on file  . Attends 09/21/19 Meetings: Not on file  . Marital Status: Not on file    Allergies  Allergen Reactions  .  Latex Itching    No current facility-administered medications on file prior to encounter.   Current Outpatient Medications on File Prior to Encounter  Medication Sig Dispense Refill  . acetaminophen (TYLENOL) 500 MG tablet Take 1,000 mg by mouth every 6 (six) hours as needed.    . famotidine (PEPCID) 20 MG tablet Take 1 tablet (20 mg total) by mouth 2 (two) times daily. 60 tablet 2  . Prenatal Vit-Fe Fumarate-FA (MULTIVITAMIN-PRENATAL) 27-0.8 MG TABS tablet Take 1 tablet by mouth daily at 12 noon.       Review of Systems  Gastrointestinal: Positive for abdominal pain (ctx).  Genitourinary: Positive for vaginal discharge. Negative for vaginal bleeding.     Physical Exam   Vitals:   04/25/20 1135 04/25/20 1144 04/25/20 1336  BP: 125/64 115/68 115/67  Pulse: (!) 108 100 85  Resp: 16    Temp: 99.1 F (37.3 C)    TempSrc: Oral    SpO2: 100%  99%    Physical Exam Vitals and nursing note reviewed. Exam conducted with a chaperone present.  Constitutional:      General: She is not  in acute distress.    Appearance: Normal appearance.  HENT:     Head: Normocephalic and atraumatic.  Pulmonary:     Effort: Pulmonary effort is normal. No respiratory distress.  Abdominal:     Palpations: Abdomen is soft.     Tenderness: There is no abdominal tenderness.     Comments: Gravid   Genitourinary:    Comments: SSE: no pool, fern neg SVE: FT/thick Musculoskeletal:        General: Normal range of motion.     Cervical back: Normal range of motion.  Skin:    General: Skin is warm and dry.  Neurological:     General: No focal deficit present.     Mental Status: She is alert and oriented to person, place, and time.  Psychiatric:        Mood and Affect: Mood normal.   EFM: 140 bpm, mod variability, + accels, no decels Toco: rare  Results for orders placed or performed during the hospital encounter of 04/25/20 (from the past 24 hour(s))  Fern Test     Status: None   Collection Time: 04/25/20  1:30 PM  Result Value Ref Range   POCT Fern Test Negative = intact amniotic membranes    MAU Course  Procedures  MDM Labs ordered and reviewed. NST reactive and pt reports good FM (NST marked). No signs of PTL or SROM. Stable for discharge home.    Assessment and Plan   1. [redacted] weeks gestation of pregnancy   2. NST (non-stress test) reactive   3. False labor    Discharge home Follow up at [facility] next week as scheduled PTL precautions  Allergies as of 04/25/2020      Reactions   Latex Itching      Medication List    STOP taking these medications   docusate sodium 100 MG capsule Commonly known as: COLACE   hydrocortisone 25 MG suppository Commonly known as: ANUSOL-HC   polyethylene glycol powder 17 GM/SCOOP powder Commonly known as: GLYCOLAX/MIRALAX     TAKE these medications   acetaminophen 500 MG tablet Commonly known as: TYLENOL Take 1,000 mg by mouth every 6 (six) hours as needed.   famotidine 20 MG tablet Commonly known as: PEPCID Take 1 tablet (20 mg  total) by mouth 2 (two) times daily.   multivitamin-prenatal 27-0.8 MG Tabs tablet Take  1 tablet by mouth daily at 12 noon.      Donette Larry, CNM 04/25/2020 2:04 PM

## 2020-04-25 NOTE — Discharge Instructions (Signed)
Contracciones de Braxton Hicks °Braxton Hicks Contractions °Las contracciones del útero pueden presentarse durante todo el embarazo, pero no siempre indican que la mujer está de parto. Es posible que usted haya tenido contracciones de práctica llamadas "contracciones de Braxton Hicks". A veces, se las confunde con el parto real. °¿Qué son las contracciones de Braxton Hicks? °Las contracciones de Braxton Hicks son espasmos que se producen en los músculos del útero antes del parto. A diferencia de las contracciones del parto verdadero, estas no producen el agrandamiento (la dilatación) ni el afinamiento del cuello uterino. Hacia el final del embarazo (entre las semanas 32 y 34), las contracciones de Braxton Hicks pueden presentarse más seguido y tornarse más intensas. A veces, resulta difícil distinguirlas del parto verdadero porque pueden ser muy molestas. No debe sentirse avergonzada si concurre al hospital con falso parto. °En ocasiones, la única forma de saber si el trabajo de parto es verdadero es que el médico determine si hay cambios en el cuello del útero. El médico le hará un examen físico y quizás le controle las contracciones. Si usted no está de parto verdadero, el examen debe indicar que el cuello uterino no está dilatado y que usted no ha roto bolsa. °Si no hay otros problemas de salud asociados con su embarazo, no habrá inconvenientes si la envían a su casa con un falso parto. Es posible que las contracciones de Braxton Hicks continúen hasta que se desencadene el parto verdadero. °Cómo diferenciar el trabajo de parto falso del verdadero °Trabajo de parto verdadero °· Las contracciones duran de 30 a 70 segundos. °· Las contracciones pueden tornarse muy regulares. °· La molestia generalmente se siente en la parte superior del útero y se extiende hacia la zona baja del abdomen y hacia la cintura. °· Las contracciones no desaparecen cuando usted camina. °· Las contracciones generalmente se hacen más  intensas y aumentan en frecuencia. °· El cuello uterino se dilata y se afina. °Parto falso °· En general, las contracciones son más cortas y no tan intensas como las del parto verdadero. °· En general, las contracciones son irregulares. °· A menudo, las contracciones se sienten en la parte delantera de la parte baja del abdomen y en la ingle. °· Las contracciones pueden desaparecer cuando usted camina o cambia de posición mientras está acostada. °· Las contracciones se vuelven más débiles y su duración es menor a medida que transcurre el tiempo. °· En general, el cuello uterino no se dilata ni se afina. °Siga estas indicaciones en su casa: ° °· Tome los medicamentos de venta libre y los recetados solamente como se lo haya indicado el médico. °· Continúe haciendo los ejercicios habituales y siga las demás indicaciones que el médico le dé. °· Coma y beba con moderación si cree que está de parto. °· Si las contracciones de Braxton Hicks le provocan incomodidad: °? Cambie de posición: si está acostada o descansando, camine; si está caminando, descanse. °? Siéntese y descanse en una bañera con agua tibia. °? Beba suficiente líquido como para mantener la orina de color amarillo pálido. La deshidratación puede provocar contracciones. °? Respire lenta y profundamente varias veces por hora. °· Vaya a todas las visitas de control prenatales y de control como se lo haya indicado el médico. Esto es importante. °Comuníquese con un médico si: °· Tiene fiebre. °· Siente dolor constante en el abdomen. °Solicite ayuda de inmediato si: °· Las contracciones se intensifican, se hacen más regulares y cercanas entre sí. °· Tiene una pérdida de líquido por la vagina. °· Elimina   una mucosidad sanguinolenta (pérdida del tapón mucoso). °· Tiene una hemorragia vaginal. °· Tiene un dolor en la zona lumbar que nunca tuvo antes. °· Siente que la cabeza del bebé empuja hacia abajo y ejerce presión en la zona pélvica. °· El bebé no se mueve tanto  como antes. °Resumen °· Las contracciones que se presentan antes del parto se conocen como contracciones de Braxton Hicks, falso parto o contracciones de práctica. °· En general, las contracciones de Braxton Hicks son más cortas, más débiles, con más tiempo entre una y otra, y menos regulares que las contracciones del parto verdadero. Las contracciones del parto verdadero se intensifican progresivamente y se tornan regulares y más frecuentes. °· Para controlar la molestia que producen las contracciones de Braxton Hicks, puede cambiar de posición, darse un baño templado y descansar, beber mucha agua o practicar la respiración profunda. °Esta información no tiene como fin reemplazar el consejo del médico. Asegúrese de hacerle al médico cualquier pregunta que tenga. °Document Revised: 08/19/2017 Document Reviewed: 12/20/2016 °Elsevier Patient Education © 2020 Elsevier Inc. ° °

## 2020-04-25 NOTE — MAU Note (Signed)
. °  Paula Gamble is a 24 y.o. at [redacted]w[redacted]d here in MAU reporting: Has not felt her baby move since 0400 this morning. States she is also having lower abdominal pain that radiates into her lower back. Also reports watery d/c that started yesterday but she states they checked yesterday in MAU and it was not. No VB.   Pain score: 8 Vitals:   04/25/20 1135  BP: 125/64  Pulse: (!) 108  Resp: 16  Temp: 99.1 F (37.3 C)  SpO2: 100%     FHT:[id number]

## 2020-04-28 NOTE — BH Specialist Note (Signed)
Integrated Behavioral Health via Telemedicine Visit  04/28/2020 Paula Gamble [id number]  Number of Integrated Behavioral Health visits: 1 Session Start time: 8:17  Session End time: 9:32 Total time: 15  Referring Provider: Merian Capron, MD Patient/Family location: Home [facility] Provider location: Center for [facility]  All persons participating in visit: [name] and [facility] Olanrewaju Osborn   Types of Service: Individual psychotherapy  I connected with Darryl Lent and Spanish Interpreter Casel [phone] by Video enabled telemedicine application Careg[employer] and verified that I am speaking with the correct person using two identifiers.    Discussed confidentiality: Yes   I discussed the limitations of telemedicine and the availability of in person appointments.  Discussed there is a possibility of technology failure and discussed alternative modes of communication if that failure occurs.  I discussed that engaging in this telemedicine visit, they consent to the provision of behavioral healthcare and the services will be billed under their insurance.  Patient and/or legal guardian expressed understanding and consented to Telemedicine visit: Yes   Presenting Concerns: Patient and/or family reports the following symptoms/concerns: Pt states she is scheduled for cesarean tomorrow, and attributes symptoms to this stage of pregnancy. Pt is concerned about being uninsured until after baby is born, and agrees she would like to be scheduled with [facility] Intern postpartum. Pt would like to set up MyChart, and open to hear about postpartum support group available for Spanish-speaking women; looking forward to holding her baby tomorrow. Duration of problem: Third trimester pregnancy; Severity of problem: moderate  Patient and/or Family's Strengths/Protective Factors: Sense of purpose  Goals Addressed: Patient will: 1.  Reduce  symptoms of: anxiety and depression  2.  Increase knowledge and/or ability of: healthy habits  3.  Demonstrate ability to: Increase adequate support systems for patient/family  Progress towards Goals: Ongoing  Interventions: Interventions utilized:  Solution-Focused Strategies and Psychoeducation and/or Health Education Standardized Assessments completed: Not Needed  Patient and/or Family Response: Pt agrees to treatment plan  Assessment: Patient currently experiencing Adjustment disorder with mixed anxiety and depressed mood.   Patient may benefit from psychoeducation and brief therapeutic interventions regarding coping with symptoms of depression and anxiety .  Plan: 1. Follow up with behavioral health clinician on : One month with [facility] Intern 2. Behavioral recommendations:  -Continue taking prenatal vitamin until postpartum visit -Consider registering for online new mom support group, [facility] at [email or web address] -Set up MyChart using code sent to phone; if any problems, contact MyChart Help Desk at [phone]  3. Referral(s): Integrated Art gallery manager (In Clinic) and [employer]:  New mom support  I discussed the assessment and treatment plan with the patient and/or parent/guardian. They were provided an opportunity to ask questions and all were answered. They agreed with the plan and demonstrated an understanding of the instructions.   They were advised to call back or seek an in-person evaluation if the symptoms worsen or if the condition fails to improve as anticipated.  Rae Lips, LCSW   Depression screen [facility] 2/9 04/23/2020 04/09/2020 03/06/2020 02/21/2020 12/27/2019  Decreased Interest 3 1 1 1  0  Down, Depressed, Hopeless 3 1 0 0 0  PHQ - 2 Score 6 2 1 1  0  Altered sleeping 3 1 1 1 1   Tired, decreased energy 3 1 1 1 1   Change in appetite 2 0 1 1 0  Feeling bad or failure about yourself  0 0 0 0 0  Trouble  concentrating 1 0 0 0 0   Moving slowly or fidgety/restless 1 2 1  0 0  Suicidal thoughts 0 0 0 0 0  PHQ-9 Score 16 6 5 4 2   Some recent data might be hidden   GAD 7 : Generalized Anxiety Score 04/23/2020 04/09/2020 03/06/2020 11/29/2019  Nervous, Anxious, on Edge 2 2 1 1   Control/stop worrying 2 1 0 0  Worry too much - different things 1 1 0 0  Trouble relaxing 1 1 0 0  Restless 0 0 0 0  Easily annoyed or irritable 3 3 1 2   Afraid - awful might happen 2 0 0 0  Total GAD 7 Score 11 8 2  3

## 2020-04-29 ENCOUNTER — Encounter (HOSPITAL_COMMUNITY): Payer: Self-pay

## 2020-04-29 NOTE — Pre-Procedure Instructions (Signed)
Interpreter number [id number] 

## 2020-04-29 NOTE — Patient Instructions (Signed)
Instrucciones Para Antes de la Ciruga   Su ciruga est programada para 05/10/2020  (your procedure is scheduled on) Entre por la entrada principal del [facility]  a las 0730 de la [place] -(enter through the main entrance at [facility] at [employer] AM    [employer] telfono, [place] [phone] para informarnos de su llegada. (pick up phone, dial [phone] on arrival)     Por favor llame al [phone] si tiene algn problema en la maana de la ciruga (please call this number if you have any problems the morning of surgery.)                  Recuerde: (Remember)  No coma alimentos. (Do not eat food (After Midnight) Desps de medianoche)    No tome lquidos claros. (Do not drink clear liquids (After Midnight) Desps de medianoche)    No use joyas, maquillaje de ojos, lpiz labial, crema para el cuerpo o esmalte de uas oscuro. (Do not wear jewelry, eye makeup, lipstick, body lotion, or dark fingernail polish). Puede usar desodorante (you may wear deodorant)    No se afeite 48 horas de su ciruga. (Do not shave 48 hours before your surgery)    No traiga objetos de valor al hospital.  [facility] no se hace responsable de ninguna pertenencia, ni objetos de valor que haya trado al hospital. (Do not bring valuable to the hospital.  [facility] is not responsible for any belongings or valuables brought to the hospital)   [facility] medicinas en la maana de la ciruga con un SORBITO de agua na  (take these meds the morning of surgery with a SIP of water)     Durante la ciruga no se pueden usar lentes de contacto, dentaduras o puentes. (Contacts, dentures or bridgework cannot be worn in surgery).   Si va a ser ingresado despus de la ciruga, deje la [employer] en el carro hasta que se le haya asignado una habitacin. (If you are to be admitted after surgery, leave suitcase in car until your room has been assigned.)   A los pacientes que se les d  de alta el mismo da no se les permitir manejar a casa.  (Patients discharged on the day of surgery will not be allowed to drive home)    [place] y nmero de telfono del Programmer, multimedia na. (Name and telephone number of your driver)   Instrucciones especiales N/A (Special Instructions)   Por favor, lea las hojas informativas que le entregaron. (Please read over the following fact sheets that you were given) Surgical Site Infection Prevention

## 2020-04-30 ENCOUNTER — Encounter: Payer: Self-pay | Admitting: Obstetrics and Gynecology

## 2020-05-08 ENCOUNTER — Other Ambulatory Visit (HOSPITAL_COMMUNITY)
Admission: RE | Admit: 2020-05-08 | Discharge: 2020-05-08 | Disposition: A | Discharge status: Home / Self Care | Payer: Self-pay | Source: Ambulatory Visit | Attending: Obstetrics & Gynecology | Admitting: Obstetrics & Gynecology

## 2020-05-08 ENCOUNTER — Encounter (HOSPITAL_COMMUNITY)
Admission: RE | Admit: 2020-05-08 | Discharge: 2020-05-08 | Disposition: A | Discharge status: Home / Self Care | Payer: Self-pay | Source: Ambulatory Visit | Attending: Obstetrics & Gynecology | Admitting: Obstetrics & Gynecology

## 2020-05-08 ENCOUNTER — Other Ambulatory Visit: Payer: Self-pay

## 2020-05-08 DIAGNOSIS — Z20822 Contact with and (suspected) exposure to covid-19: Secondary | ICD-10-CM | POA: Insufficient documentation

## 2020-05-08 DIAGNOSIS — Z01812 Encounter for preprocedural laboratory examination: Secondary | ICD-10-CM | POA: Insufficient documentation

## 2020-05-08 LAB — SARS CORONAVIRUS 2 (TAT 6-24 HRS): SARS Coronavirus 2: NEGATIVE

## 2020-05-08 LAB — CBC
HCT: 35.1 % — ABNORMAL LOW (ref 36.0–46.0)
Hemoglobin: 11.8 g/dL — ABNORMAL LOW (ref 12.0–15.0)
MCH: 31.5 pg (ref 26.0–34.0)
MCHC: 33.6 g/dL (ref 30.0–36.0)
MCV: 93.6 fL (ref 80.0–100.0)
Platelets: 202 10*3/uL (ref 150–400)
RBC: 3.75 MIL/uL — ABNORMAL LOW (ref 3.87–5.11)
RDW: 13.5 % (ref 11.5–15.5)
WBC: 10.7 10*3/uL — ABNORMAL HIGH (ref 4.0–10.5)
nRBC: 0 % (ref 0.0–0.2)

## 2020-05-08 LAB — TYPE AND SCREEN
ABO/RH(D): O POS
Antibody Screen: NEGATIVE

## 2020-05-08 NOTE — Pre-Procedure Instructions (Signed)
[id number] interpreter number

## 2020-05-09 ENCOUNTER — Other Ambulatory Visit: Payer: Self-pay

## 2020-05-09 ENCOUNTER — Ambulatory Visit: Payer: Self-pay | Admitting: Clinical

## 2020-05-09 ENCOUNTER — Ambulatory Visit (INDEPENDENT_AMBULATORY_CARE_PROVIDER_SITE_OTHER): Payer: Self-pay | Admitting: Family Medicine

## 2020-05-09 VITALS — BP 115/69 | HR 83 | Wt 210.0 lb

## 2020-05-09 DIAGNOSIS — Z3493 Encounter for supervision of normal pregnancy, unspecified, third trimester: Secondary | ICD-10-CM

## 2020-05-09 DIAGNOSIS — O2340 Unspecified infection of urinary tract in pregnancy, unspecified trimester: Secondary | ICD-10-CM

## 2020-05-09 DIAGNOSIS — Z98891 History of uterine scar from previous surgery: Secondary | ICD-10-CM

## 2020-05-09 DIAGNOSIS — B951 Streptococcus, group B, as the cause of diseases classified elsewhere: Secondary | ICD-10-CM

## 2020-05-09 DIAGNOSIS — F4323 Adjustment disorder with mixed anxiety and depressed mood: Secondary | ICD-10-CM

## 2020-05-09 DIAGNOSIS — O99210 Obesity complicating pregnancy, unspecified trimester: Secondary | ICD-10-CM

## 2020-05-09 DIAGNOSIS — Z789 Other specified health status: Secondary | ICD-10-CM

## 2020-05-09 LAB — RPR: RPR Ser Ql: NONREACTIVE

## 2020-05-09 NOTE — Patient Instructions (Signed)
 Lactancia materna Breastfeeding  Decidir amamantar es una de las mejores elecciones que puede hacer por usted y su beb. Un cambio en las hormonas durante el embarazo hace que las mamas produzcan leche materna en las glndulas productoras de leche. Las hormonas impiden que la leche materna sea liberada antes del nacimiento del beb. Adems, impulsan el flujo de leche luego del nacimiento. Una vez que ha comenzado a amamantar, pensar en el beb, as como la succin o el llanto, pueden estimular la liberacin de leche de las glndulas productoras de leche. Los beneficios de amamantar Las investigaciones demuestran que la lactancia materna ofrece muchos beneficios de salud para bebs y madres. Adems, ofrece una forma gratuita y conveniente de alimentar al beb. Para el beb  La primera leche (calostro) ayuda a mejorar el funcionamiento del aparato digestivo del beb.  Las clulas especiales de la leche (anticuerpos) ayudan a combatir las infecciones en el beb.  Los bebs que se alimentan con leche materna tambin tienen menos probabilidades de tener asma, alergias, obesidad o diabetes de tipo 2. Adems, tienen menor riesgo de sufrir el sndrome de muerte sbita del lactante (SMSL).  Los nutrientes de la leche materna son mejores para satisfacer las necesidades del beb en comparacin con la leche maternizada.  La leche materna mejora el desarrollo cerebral del beb. Para usted  La lactancia materna favorece el desarrollo de un vnculo muy especial entre la madre y el beb.  Es conveniente. La leche materna es econmica y siempre est disponible a la temperatura correcta.  La lactancia materna ayuda a quemar caloras. Le ayuda a perder el peso ganado durante el embarazo.  Hace que el tero vuelva al tamao que tena antes del embarazo ms rpido. Adems, disminuye el sangrado (loquios) despus del parto.  La lactancia materna contribuye a reducir el riesgo de tener diabetes de tipo 2,  osteoporosis, artritis reumatoide, enfermedades cardiovasculares y cncer de mama, ovario, tero y endometrio en el futuro. Informacin bsica sobre la lactancia Comienzo de la lactancia  Encuentre un lugar cmodo para sentarse o acostarse, con un buen respaldo para el cuello y la espalda.  Coloque una almohada o una manta enrollada debajo del beb para acomodarlo a la altura de la mama (si est sentada). Las almohadas para amamantar se han diseado especialmente a fin de servir de apoyo para los brazos y el beb mientras amamanta.  Asegrese de que la barriga del beb (abdomen) est frente a la suya.  Masajee suavemente la mama. Con las yemas de los dedos, masajee los bordes exteriores de la mama hacia adentro, en direccin al pezn. Esto estimula el flujo de leche. Si la leche fluye lentamente, es posible que deba continuar con este movimiento durante la lactancia.  Sostenga la mama con 4 dedos por debajo y el pulgar por arriba del pezn (forme la letra "C" con la mano). Asegrese de que los dedos se encuentren lejos del pezn y de la boca del beb.  Empuje suavemente los labios del beb con el pezn o con el dedo.  Cuando la boca del beb se abra lo suficiente, acrquelo rpidamente a la mama e introduzca todo el pezn y la arola, tanto como sea posible, dentro de la boca del beb. La arola es la zona de color que rodea al pezn. ? Debe haber ms arola visible por arriba del labio superior del beb que por debajo del labio inferior. ? Los labios del beb deben estar abiertos y extendidos hacia afuera (evertidos) para asegurar   que el beb se prenda de forma adecuada y cmoda. ? La lengua del beb debe estar entre la enca inferior y la mama.  Asegrese de que la boca del beb est en la posicin correcta alrededor del pezn (prendido). Los labios del beb deben crear un sello sobre la mama y estar doblados hacia afuera (invertidos).  Es comn que el beb succione durante 2 a 3 minutos  para que comience el flujo de leche materna. Cmo debe prenderse Es muy importante que le ensee al beb cmo prenderse adecuadamente a la mama. Si el beb no se prende adecuadamente, puede causar dolor en los pezones, reducir la produccin de leche materna y hacer que el beb tenga un escaso aumento de peso. Adems, si el beb no se prende adecuadamente al pezn, puede tragar aire durante la alimentacin. Esto puede causarle molestias al beb. Hacer eructar al beb al cambiar de mama puede ayudarlo a liberar el aire. Sin embargo, ensearle al beb cmo prenderse a la mama adecuadamente es la mejor manera de evitar que se sienta molesto por tragar aire mientras se alimenta. Signos de que el beb se ha prendido adecuadamente al pezn  Tironea o succiona de modo silencioso, sin causarle dolor. Los labios del beb deben estar extendidos hacia afuera (evertidos).  Se escucha que traga cada 3 o 4 succiones una vez que la leche ha comenzado a fluir (despus de que se produzca el reflejo de eyeccin de la leche).  Hay movimientos musculares por arriba y por delante de sus odos al succionar. Signos de que el beb no se ha prendido adecuadamente al pezn  Hace ruidos de succin o de chasquido mientras se alimenta.  Siente dolor en los pezones. Si cree que el beb no se prendi correctamente, deslice el dedo en la comisura de la boca y colquelo entre las encas del beb para interrumpir la succin. Intente volver a comenzar a amamantar. Signos de lactancia materna exitosa Signos del beb  El beb disminuir gradualmente el nmero de succiones o dejar de succionar por completo.  El beb se quedar dormido.  El cuerpo del beb se relajar.  El beb retendr una pequea cantidad de leche en la boca.  El beb se desprender solo del pecho. Signos que presenta usted  Las mamas han aumentado la firmeza, el peso y el tamao 1 a 3 horas despus de amamantar.  Estn ms blandas inmediatamente despus  de amamantar.  Se producen un aumento del volumen de leche y un cambio en su consistencia y color hacia el quinto da de lactancia.  Los pezones no duelen, no estn agrietados ni sangran. Signos de que su beb recibe la cantidad de leche suficiente  Mojar por lo menos 1 o 2paales durante las primeras 24horas despus del nacimiento.  Mojar por lo menos 5 o 6paales cada 24horas durante la primera semana despus del nacimiento. La orina debe ser clara o de color amarillo plido a los 5das de vida.  Mojar entre 6 y 8paales cada 24horas a medida que el beb sigue creciendo y desarrollndose.  Defeca por lo menos 3 veces en 24 horas a los 5 das de vida. Las heces deben ser blandas y amarillentas.  Defeca por lo menos 3 veces en 24 horas a los 7 das de vida. Las heces deben ser grumosas y amarillentas.  No registra una prdida de peso mayor al 10% del peso al nacer durante los primeros 3 das de vida.  Aumenta de peso un promedio de 4   a 7onzas (113 a 198g) por semana despus de los 4 das de vida.  Aumenta de peso, diariamente, de manera uniforme a partir de los 5 das de vida, sin registrar prdida de peso despus de las 2semanas de vida. Despus de alimentarse, es posible que el beb regurgite una pequea cantidad de leche. Esto es normal. Frecuencia y duracin de la lactancia El amamantamiento frecuente la ayudar a producir ms leche y puede prevenir dolores en los pezones y las mamas extremadamente llenas (congestin mamaria). Alimente al beb cuando muestre signos de hambre o si siente la necesidad de reducir la congestin de las mamas. Esto se denomina "lactancia a demanda". Las seales de que el beb tiene hambre incluyen las siguientes:  Aumento del estado de alerta, actividad o inquietud.  Mueve la cabeza de un lado a otro.  Abre la boca cuando se le toca la mejilla o la comisura de la boca (reflejo de bsqueda).  Aumenta las vocalizaciones, tales como sonidos de  succin, se relame los labios, emite arrullos, suspiros o chirridos.  Mueve la mano hacia la boca y se chupa los dedos o las manos.  Est molesto o llora. Evite el uso del chupete en las primeras 4 a 6 semanas despus del nacimiento del beb. Despus de este perodo, podr usar un chupete. Las investigaciones demostraron que el uso del chupete durante el primer ao de vida del beb disminuye el riesgo de tener el sndrome de muerte sbita del lactante (SMSL). Permita que el nio se alimente en cada mama todo lo que desee. Cuando el beb se desprende o se queda dormido mientras se est alimentando de la primera mama, ofrzcale la segunda. Debido a que, con frecuencia, los recin nacidos estn somnolientos las primeras semanas de vida, es posible que deba despertar al beb para alimentarlo. Los horarios de lactancia varan de un beb a otro. Sin embargo, las siguientes reglas pueden servir como gua para ayudarla a garantizar que el beb se alimenta adecuadamente:  Se puede amamantar a los recin nacidos (bebs de 4 semanas o menos de vida) cada 1 a 3 horas.  No deben transcurrir ms de 3 horas durante el da o 5 horas durante la noche sin que se amamante a los recin nacidos.  Debe amamantar al beb un mnimo de 8 veces en un perodo de 24 horas. Extraccin de leche materna     La extraccin y el almacenamiento de la leche materna le permiten asegurarse de que el beb se alimente exclusivamente de su leche materna, aun en momentos en los que no puede amamantar. Esto tiene especial importancia si debe regresar al trabajo en el perodo en que an est amamantando o si no puede estar presente en los momentos en que el beb debe alimentarse. Su asesor en lactancia puede ayudarla a encontrar un mtodo de extraccin que funcione mejor para usted y orientarla sobre cunto tiempo es seguro almacenar leche materna. Cmo cuidar las mamas durante la lactancia Los pezones pueden secarse, agrietarse y doler  durante la lactancia. Las siguientes recomendaciones pueden ayudarla a mantener las mamas humectadas y sanas:  Evite usar jabn en los pezones.  Use un sostn de soporte diseado especialmente para la lactancia materna. Evite usar sostenes con aro o sostenes muy ajustados (sostenes deportivos).  Seque al aire sus pezones durante 3 a 4minutos despus de amamantar al beb.  Utilice solo apsitos de algodn en el sostn para absorber las prdidas de leche. La prdida de un poco de leche materna entre   las tomas es normal.  Utilice lanolina sobre los pezones luego de amamantar. La lanolina ayuda a mantener la humedad normal de la piel. La lanolina pura no es perjudicial (no es txica) para el beb. Adems, puede extraer manualmente algunas gotas de leche materna y masajear suavemente esa leche sobre los pezones para que la leche se seque al aire. Durante las primeras semanas despus del nacimiento, algunas mujeres experimentan congestin mamaria. La congestin mamaria puede hacer que sienta las mamas pesadas, calientes y sensibles al tacto. El pico de la congestin mamaria ocurre en el plazo de los 3 a 5 das despus del parto. Las siguientes recomendaciones pueden ayudarla a aliviar la congestin mamaria:  Vace por completo las mamas al amamantar o extraer leche. Puede aplicar calor hmedo en las mamas (en la ducha o con toallas hmedas para manos) antes de amamantar o extraer leche. Esto aumenta la circulacin y ayuda a que la leche fluya. Si el beb no vaca por completo las mamas cuando lo amamanta, extraiga la leche restante despus de que haya finalizado.  Aplique compresas de hielo sobre las mamas inmediatamente despus de amamantar o extraer leche, a menos que le resulte demasiado incmodo. Haga lo siguiente: ? Ponga el hielo en una bolsa plstica. ? Coloque una toalla entre la piel y la bolsa de hielo. ? Coloque el hielo durante 20minutos, 2 o 3veces por da.  Asegrese de que el beb  est prendido y se encuentre en la posicin correcta mientras lo alimenta. Si la congestin mamaria persiste luego de 48 horas o despus de seguir estas recomendaciones, comunquese con su mdico o un asesor en lactancia. Recomendaciones de salud general durante la lactancia  Consuma 3 comidas y 3 colaciones saludables todos los das. Las madres bien alimentadas que amamantan necesitan entre 450 y 500 caloras adicionales por da. Puede cumplir con este requisito al aumentar la cantidad de una dieta equilibrada que realice.  Beba suficiente agua para mantener la orina clara o de color amarillo plido.  Descanse con frecuencia, reljese y siga tomando sus vitaminas prenatales para prevenir la fatiga, el estrs y los niveles bajos de vitaminas y minerales en el cuerpo (deficiencias de nutrientes).  No consuma ningn producto que contenga nicotina o tabaco, como cigarrillos y cigarrillos electrnicos. El beb puede verse afectado por las sustancias qumicas de los cigarrillos que pasan a la leche materna y por la exposicin al humo ambiental del tabaco. Si necesita ayuda para dejar de fumar, consulte al mdico.  Evite el consumo de alcohol.  No consuma drogas ilegales o marihuana.  Antes de usar cualquier medicamento, hable con el mdico. Estos incluyen medicamentos recetados y de venta libre, como tambin vitaminas y suplementos a base de hierbas. Algunos medicamentos, que pueden ser perjudiciales para el beb, pueden pasar a travs de la leche materna.  Puede quedar embarazada durante la lactancia. Si se desea un mtodo anticonceptivo, consulte al mdico sobre cules son las opciones seguras durante la lactancia. Dnde encontrar ms informacin: Liga internacional La Leche: www.llli.org. Comunquese con un mdico si:  Siente que quiere dejar de amamantar o se siente frustrada con la lactancia.  Sus pezones estn agrietados o sangran.  Sus mamas estn irritadas, sensibles o  calientes.  Tiene los siguientes sntomas: ? Dolor en las mamas o en los pezones. ? Un rea hinchada en cualquiera de las mamas. ? Fiebre o escalofros. ? Nuseas o vmitos. ? Drenaje de otro lquido distinto de la leche materna desde los pezones.  Sus mamas no   se llenan antes de amamantar al beb para el quinto da despus del parto.  Se siente triste y deprimida.  El beb: ? Est demasiado somnoliento como para comer bien. ? Tiene problemas para dormir. ? Tiene ms de 1 semana de vida y moja menos de 6 paales en un periodo de 24 horas. ? No ha aumentado de peso a los 5 das de vida.  El beb defeca menos de 3 veces en 24 horas.  La piel del beb o las partes blancas de los ojos se vuelven amarillentas. Solicite ayuda de inmediato si:  El beb est muy cansado (letargo) y no se quiere despertar para comer.  Le sube la fiebre sin causa. Resumen  La lactancia materna ofrece muchos beneficios de salud para bebs y madres.  Intente amamantar a su beb cuando muestre signos tempranos de hambre.  Haga cosquillas o empuje suavemente los labios del beb con el dedo o el pezn para lograr que el beb abra la boca. Acerque el beb a la mama. Asegrese de que la mayor parte de la arola se encuentre dentro de la boca del beb. Ofrzcale una mama y haga eructar al beb antes de pasar a la otra.  Hable con su mdico o asesor en lactancia si tiene dudas o problemas con la lactancia. Esta informacin no tiene como fin reemplazar el consejo del mdico. Asegrese de hacerle al mdico cualquier pregunta que tenga. Document Revised: 08/04/2017 Document Reviewed: 08/30/2016 Elsevier Patient Education  2020 [employer]  

## 2020-05-09 NOTE — Progress Notes (Signed)
   Subjective:  Uliana Brinker is a 24 y.o. [phone] at [redacted]w[redacted]d being seen today for ongoing prenatal care.  She is currently monitored for the following issues for this high-risk pregnancy and has Supervision of low-risk pregnancy; Language barrier; History of COVID-19; History of pyelonephritis during pregnancy; History of C-section; Obesity during pregnancy; GBS (group B streptococcus) UTI complicating pregnancy; Anemia in pregnancy; History of ileus; and Gallstone on their problem list.  Patient reports no complaints.  Contractions: Irritability. Vag. Bleeding: None.  Movement: Present. Denies leaking of fluid.   The following portions of the patient's history were reviewed and updated as appropriate: allergies, current medications, past family history, past medical history, past social history, past surgical history and problem list. Problem list updated.  Objective:   Vitals:   05/09/20 1100  BP: 115/69  Pulse: 83  Weight: 210 lb (95.3 kg)    Fetal Status: Fetal Heart Rate (bpm): 138   Movement: Present     General:  Alert, oriented and cooperative. Patient is in no acute distress.  Skin: Skin is warm and dry. No rash noted.   Cardiovascular: Normal heart rate noted  Respiratory: Normal respiratory effort, no problems with respiration noted  Abdomen: Soft, gravid, appropriate for gestational age. Pain/Pressure: Present     Pelvic: Vag. Bleeding: None Vag D/C Character: Mucous   Cervical exam deferred        Extremities: Normal range of motion.  Edema: Trace  Mental Status: Normal mood and affect. Normal behavior. Normal judgment and thought content.   Urinalysis:      Assessment and Plan:  Pregnancy: G4P1021 at [redacted]w[redacted]d  1. Encounter for supervision of low-risk pregnancy in third trimester BP and FHR normal  2. Obesity during pregnancy   3. Language barrier Spanish   4. History of C-section Scheduled for CS tomorrow w Dr. Penne Lash Would like hormonal IUD inserted at  time of cesarean  5. Group B Streptococcus urinary tract infection affecting pregnancy, antepartum   Term labor symptoms and general obstetric precautions including but not limited to vaginal bleeding, contractions, leaking of fluid and fetal movement were reviewed in detail with the patient. Please refer to After Visit Summary for other counseling recommendations.  Return in 6 weeks (on 06/20/2020).   [name], MD

## 2020-05-10 ENCOUNTER — Inpatient Hospital Stay (HOSPITAL_COMMUNITY): Payer: Medicaid Other | Admitting: Anesthesiology

## 2020-05-10 ENCOUNTER — Encounter (HOSPITAL_COMMUNITY)
Admission: AD | Disposition: A | Discharge status: Home / Self Care | Payer: Self-pay | Source: Home / Self Care | Attending: Obstetrics & Gynecology

## 2020-05-10 ENCOUNTER — Encounter (HOSPITAL_COMMUNITY): Payer: Self-pay | Admitting: Obstetrics & Gynecology

## 2020-05-10 ENCOUNTER — Other Ambulatory Visit: Payer: Self-pay

## 2020-05-10 ENCOUNTER — Inpatient Hospital Stay (HOSPITAL_COMMUNITY)
Admission: RE | Admit: 2020-05-10 | Discharge: 2020-05-12 | DRG: 788 | Disposition: A | Discharge status: Home / Self Care | Payer: Medicaid Other | Attending: Obstetrics & Gynecology | Admitting: Obstetrics & Gynecology

## 2020-05-10 ENCOUNTER — Encounter (HOSPITAL_COMMUNITY)
Admission: RE | Disposition: A | Discharge status: Home / Self Care | Payer: Self-pay | Source: Home / Self Care | Attending: Obstetrics & Gynecology

## 2020-05-10 DIAGNOSIS — Z8616 Personal history of COVID-19: Secondary | ICD-10-CM | POA: Diagnosis not present

## 2020-05-10 DIAGNOSIS — Z975 Presence of (intrauterine) contraceptive device: Secondary | ICD-10-CM

## 2020-05-10 DIAGNOSIS — O99210 Obesity complicating pregnancy, unspecified trimester: Secondary | ICD-10-CM

## 2020-05-10 DIAGNOSIS — Z30430 Encounter for insertion of intrauterine contraceptive device: Secondary | ICD-10-CM

## 2020-05-10 DIAGNOSIS — O321XX0 Maternal care for breech presentation, not applicable or unspecified: Secondary | ICD-10-CM | POA: Diagnosis present

## 2020-05-10 DIAGNOSIS — O99824 Streptococcus B carrier state complicating childbirth: Secondary | ICD-10-CM | POA: Diagnosis present

## 2020-05-10 DIAGNOSIS — Z8759 Personal history of other complications of pregnancy, childbirth and the puerperium: Secondary | ICD-10-CM

## 2020-05-10 DIAGNOSIS — O34211 Maternal care for low transverse scar from previous cesarean delivery: Principal | ICD-10-CM | POA: Diagnosis present

## 2020-05-10 DIAGNOSIS — Z3493 Encounter for supervision of normal pregnancy, unspecified, third trimester: Secondary | ICD-10-CM

## 2020-05-10 DIAGNOSIS — O99013 Anemia complicating pregnancy, third trimester: Secondary | ICD-10-CM

## 2020-05-10 DIAGNOSIS — Z3A39 39 weeks gestation of pregnancy: Secondary | ICD-10-CM

## 2020-05-10 DIAGNOSIS — Z20822 Contact with and (suspected) exposure to covid-19: Secondary | ICD-10-CM | POA: Diagnosis present

## 2020-05-10 DIAGNOSIS — O2340 Unspecified infection of urinary tract in pregnancy, unspecified trimester: Secondary | ICD-10-CM

## 2020-05-10 DIAGNOSIS — Z789 Other specified health status: Secondary | ICD-10-CM | POA: Diagnosis present

## 2020-05-10 DIAGNOSIS — Z98891 History of uterine scar from previous surgery: Secondary | ICD-10-CM

## 2020-05-10 DIAGNOSIS — Z3490 Encounter for supervision of normal pregnancy, unspecified, unspecified trimester: Secondary | ICD-10-CM

## 2020-05-10 DIAGNOSIS — Z8719 Personal history of other diseases of the digestive system: Secondary | ICD-10-CM

## 2020-05-10 HISTORY — PX: INTRAUTERINE DEVICE (IUD) INSERTION: SHX5877

## 2020-05-10 SURGERY — Surgical Case
Anesthesia: Regional

## 2020-05-10 SURGERY — Surgical Case
Anesthesia: Spinal

## 2020-05-10 MED ORDER — ACETAMINOPHEN 500 MG PO TABS
1000.0000 mg | ORAL_TABLET | Freq: Four times a day (QID) | ORAL | Status: DC
  Administered 2020-05-10 – 2020-05-12 (×6): 1000 mg via ORAL
  Filled 2020-05-10 (×7): qty 2

## 2020-05-10 MED ORDER — NALBUPHINE HCL 10 MG/ML IJ SOLN
5.0000 mg | INTRAMUSCULAR | Status: DC | PRN
  Administered 2020-05-10 – 2020-05-11 (×2): 5 mg via INTRAVENOUS
  Filled 2020-05-10: qty 1

## 2020-05-10 MED ORDER — MEPERIDINE HCL 25 MG/ML IJ SOLN: 6.2500 mg | INTRAMUSCULAR | Status: DC | PRN

## 2020-05-10 MED ORDER — LACTATED RINGERS IV SOLN: INTRAVENOUS | Status: DC

## 2020-05-10 MED ORDER — PHENYLEPHRINE HCL-NACL 20-0.9 MG/250ML-% IV SOLN
INTRAVENOUS | Status: DC | PRN
  Administered 2020-05-10: 60 ug/min via INTRAVENOUS

## 2020-05-10 MED ORDER — ONDANSETRON HCL 4 MG/2ML IJ SOLN
INTRAMUSCULAR | Status: AC
  Filled 2020-05-10: qty 2

## 2020-05-10 MED ORDER — SCOPOLAMINE 1 MG/3DAYS TD PT72
1.0000 | MEDICATED_PATCH | Freq: Once | TRANSDERMAL | Status: DC
  Administered 2020-05-10: 16:00:00 1.5 mg via TRANSDERMAL
  Filled 2020-05-10: qty 1

## 2020-05-10 MED ORDER — OXYCODONE-ACETAMINOPHEN 5-325 MG PO TABS
2.0000 | ORAL_TABLET | ORAL | Status: DC | PRN
  Administered 2020-05-11: 2 via ORAL
  Filled 2020-05-10: qty 2

## 2020-05-10 MED ORDER — MENTHOL 3 MG MT LOZG: 1.0000 | LOZENGE | OROMUCOSAL | Status: DC | PRN

## 2020-05-10 MED ORDER — LEVONORGESTREL 19.5 MCG/DAY IU IUD
INTRAUTERINE_SYSTEM | INTRAUTERINE | Status: AC
  Filled 2020-05-10: qty 1

## 2020-05-10 MED ORDER — SODIUM CHLORIDE 0.9 % IV SOLN
2.0000 g | INTRAVENOUS | Status: AC
  Administered 2020-05-10: 09:00:00 2 g via INTRAVENOUS

## 2020-05-10 MED ORDER — OXYCODONE HCL 5 MG PO TABS
5.0000 mg | ORAL_TABLET | ORAL | Status: DC | PRN
  Administered 2020-05-12 (×2): 10 mg via ORAL
  Filled 2020-05-10 (×2): qty 2

## 2020-05-10 MED ORDER — HYDROMORPHONE HCL 1 MG/ML IJ SOLN: 0.2500 mg | INTRAMUSCULAR | Status: DC | PRN

## 2020-05-10 MED ORDER — HYDROMORPHONE HCL 1 MG/ML IJ SOLN: 1.0000 mg | INTRAMUSCULAR | Status: DC | PRN

## 2020-05-10 MED ORDER — STERILE WATER FOR IRRIGATION IR SOLN
Status: DC | PRN
  Administered 2020-05-10: 1

## 2020-05-10 MED ORDER — DIPHENHYDRAMINE HCL 50 MG/ML IJ SOLN: 12.5000 mg | INTRAMUSCULAR | Status: DC | PRN

## 2020-05-10 MED ORDER — BUPIVACAINE IN DEXTROSE 0.75-8.25 % IT SOLN
INTRATHECAL | Status: DC | PRN
Start: 2020-05-10 — End: 2020-05-10
  Administered 2020-05-10: 1.6 mL via INTRATHECAL

## 2020-05-10 MED ORDER — DIBUCAINE (PERIANAL) 1 % EX OINT: 1.0000 "application " | TOPICAL_OINTMENT | CUTANEOUS | Status: DC | PRN

## 2020-05-10 MED ORDER — MORPHINE SULFATE (PF) 0.5 MG/ML IJ SOLN
INTRAMUSCULAR | Status: DC | PRN
  Administered 2020-05-10: 150 ug via INTRATHECAL

## 2020-05-10 MED ORDER — COCONUT OIL OIL: 1.0000 "application " | TOPICAL_OIL | Status: DC | PRN

## 2020-05-10 MED ORDER — ONDANSETRON HCL 4 MG/2ML IJ SOLN: 4.0000 mg | Freq: Three times a day (TID) | INTRAMUSCULAR | Status: DC | PRN

## 2020-05-10 MED ORDER — WITCH HAZEL-GLYCERIN EX PADS: 1.0000 "application " | MEDICATED_PAD | CUTANEOUS | Status: DC | PRN

## 2020-05-10 MED ORDER — FENTANYL CITRATE (PF) 100 MCG/2ML IJ SOLN
INTRAMUSCULAR | Status: DC | PRN
  Administered 2020-05-10: 15 ug via INTRATHECAL

## 2020-05-10 MED ORDER — FENTANYL CITRATE (PF) 100 MCG/2ML IJ SOLN
INTRAMUSCULAR | Status: DC | PRN
  Administered 2020-05-10: 85 ug via INTRAVENOUS

## 2020-05-10 MED ORDER — OXYTOCIN-SODIUM CHLORIDE 30-0.9 UT/500ML-% IV SOLN
INTRAVENOUS | Status: AC
  Filled 2020-05-10: qty 500

## 2020-05-10 MED ORDER — NALBUPHINE HCL 10 MG/ML IJ SOLN: 5.0000 mg | Freq: Once | INTRAMUSCULAR | Status: DC | PRN

## 2020-05-10 MED ORDER — SIMETHICONE 80 MG PO CHEW
80.0000 mg | CHEWABLE_TABLET | ORAL | Status: DC | PRN
  Administered 2020-05-10 – 2020-05-11 (×2): 80 mg via ORAL
  Filled 2020-05-10 (×3): qty 1

## 2020-05-10 MED ORDER — FERROUS SULFATE 325 (65 FE) MG PO TABS
325.0000 mg | ORAL_TABLET | Freq: Two times a day (BID) | ORAL | Status: DC
  Administered 2020-05-10: 18:00:00 325 mg via ORAL
  Filled 2020-05-10: qty 1

## 2020-05-10 MED ORDER — IBUPROFEN 800 MG PO TABS
800.0000 mg | ORAL_TABLET | Freq: Four times a day (QID) | ORAL | Status: DC
  Administered 2020-05-11 – 2020-05-12 (×4): 800 mg via ORAL
  Filled 2020-05-10 (×4): qty 1

## 2020-05-10 MED ORDER — SENNOSIDES-DOCUSATE SODIUM 8.6-50 MG PO TABS
2.0000 | ORAL_TABLET | Freq: Every day | ORAL | Status: DC
  Administered 2020-05-11 – 2020-05-12 (×2): 2 via ORAL
  Filled 2020-05-10 (×3): qty 2

## 2020-05-10 MED ORDER — GABAPENTIN 100 MG PO CAPS
300.0000 mg | ORAL_CAPSULE | Freq: Two times a day (BID) | ORAL | Status: DC
  Administered 2020-05-10 – 2020-05-12 (×4): 300 mg via ORAL
  Filled 2020-05-10 (×4): qty 3

## 2020-05-10 MED ORDER — ONDANSETRON HCL 4 MG/2ML IJ SOLN: 4.0000 mg | Freq: Once | INTRAMUSCULAR | Status: DC | PRN

## 2020-05-10 MED ORDER — PHENYLEPHRINE HCL-NACL 20-0.9 MG/250ML-% IV SOLN
INTRAVENOUS | Status: AC
  Filled 2020-05-10: qty 250

## 2020-05-10 MED ORDER — NALBUPHINE HCL 10 MG/ML IJ SOLN
5.0000 mg | Freq: Once | INTRAMUSCULAR | Status: DC | PRN
Start: 2020-05-10 — End: 2020-05-12

## 2020-05-10 MED ORDER — DIPHENHYDRAMINE HCL 25 MG PO CAPS
25.0000 mg | ORAL_CAPSULE | ORAL | Status: DC | PRN
  Administered 2020-05-10: 15:00:00 25 mg via ORAL

## 2020-05-10 MED ORDER — PRENATAL MULTIVITAMIN CH
1.0000 | ORAL_TABLET | Freq: Every day | ORAL | Status: DC
  Administered 2020-05-11: 13:00:00 1 via ORAL
  Filled 2020-05-10: qty 1

## 2020-05-10 MED ORDER — FENTANYL CITRATE (PF) 100 MCG/2ML IJ SOLN
INTRAMUSCULAR | Status: AC
  Filled 2020-05-10: qty 2

## 2020-05-10 MED ORDER — OXYTOCIN-SODIUM CHLORIDE 30-0.9 UT/500ML-% IV SOLN
2.5000 [IU]/h | INTRAVENOUS | Status: AC
  Administered 2020-05-10: 16:00:00 2.5 [IU]/h via INTRAVENOUS
  Filled 2020-05-10: qty 500

## 2020-05-10 MED ORDER — ONDANSETRON HCL 4 MG/2ML IJ SOLN
INTRAMUSCULAR | Status: DC | PRN
  Administered 2020-05-10: 4 mg via INTRAVENOUS

## 2020-05-10 MED ORDER — DEXMEDETOMIDINE (PRECEDEX) IN NS 20 MCG/5ML (4 MCG/ML) IV SYRINGE
PREFILLED_SYRINGE | INTRAVENOUS | Status: DC | PRN
  Administered 2020-05-10: 12 ug via INTRAVENOUS

## 2020-05-10 MED ORDER — MORPHINE SULFATE (PF) 0.5 MG/ML IJ SOLN
INTRAMUSCULAR | Status: AC
  Filled 2020-05-10: qty 10

## 2020-05-10 MED ORDER — NALBUPHINE HCL 10 MG/ML IJ SOLN
5.0000 mg | INTRAMUSCULAR | Status: DC | PRN
  Filled 2020-05-10: qty 1

## 2020-05-10 MED ORDER — KETOROLAC TROMETHAMINE 30 MG/ML IJ SOLN
30.0000 mg | Freq: Four times a day (QID) | INTRAMUSCULAR | Status: AC
  Administered 2020-05-10 – 2020-05-11 (×3): 30 mg via INTRAVENOUS
  Filled 2020-05-10 (×3): qty 1

## 2020-05-10 MED ORDER — MEASLES, MUMPS & RUBELLA VAC IJ SOLR: 0.5000 mL | Freq: Once | INTRAMUSCULAR | Status: DC

## 2020-05-10 MED ORDER — TRAMADOL HCL 50 MG PO TABS: 50.0000 mg | ORAL_TABLET | Freq: Four times a day (QID) | ORAL | Status: DC | PRN

## 2020-05-10 MED ORDER — SOD CITRATE-CITRIC ACID 500-334 MG/5ML PO SOLN
ORAL | Status: AC
  Filled 2020-05-10: qty 30

## 2020-05-10 MED ORDER — MAGNESIUM HYDROXIDE 400 MG/5ML PO SUSP: 30.0000 mL | ORAL | Status: DC | PRN

## 2020-05-10 MED ORDER — GABAPENTIN 300 MG PO CAPS
300.0000 mg | ORAL_CAPSULE | ORAL | Status: AC
  Administered 2020-05-10: 09:00:00 300 mg via ORAL

## 2020-05-10 MED ORDER — DEXMEDETOMIDINE (PRECEDEX) IN NS 20 MCG/5ML (4 MCG/ML) IV SYRINGE
PREFILLED_SYRINGE | INTRAVENOUS | Status: AC
  Filled 2020-05-10: qty 5

## 2020-05-10 MED ORDER — ACETAMINOPHEN 500 MG PO TABS
ORAL_TABLET | ORAL | Status: AC
  Filled 2020-05-10: qty 2

## 2020-05-10 MED ORDER — LEVONORGESTREL 19.5 MCG/DAY IU IUD
INTRAUTERINE_SYSTEM | Freq: Once | INTRAUTERINE | Status: AC
  Administered 2020-05-10: 10:00:00 1 via INTRAUTERINE

## 2020-05-10 MED ORDER — KETOROLAC TROMETHAMINE 30 MG/ML IJ SOLN
30.0000 mg | Freq: Once | INTRAMUSCULAR | Status: AC | PRN
  Administered 2020-05-10: 30 mg via INTRAVENOUS

## 2020-05-10 MED ORDER — POVIDONE-IODINE 10 % EX SWAB
2.0000 "application " | Freq: Once | CUTANEOUS | Status: AC
  Administered 2020-05-10: 2 via TOPICAL

## 2020-05-10 MED ORDER — LACTATED RINGERS IV SOLN: INTRAVENOUS | Status: DC | PRN

## 2020-05-10 MED ORDER — SOD CITRATE-CITRIC ACID 500-334 MG/5ML PO SOLN
30.0000 mL | ORAL | Status: AC
  Administered 2020-05-10: 09:00:00 30 mL via ORAL

## 2020-05-10 MED ORDER — NALOXONE HCL 4 MG/10ML IJ SOLN
1.0000 ug/kg/h | INTRAMUSCULAR | Status: DC | PRN
Start: 2020-05-10 — End: 2020-05-12
  Filled 2020-05-10: qty 5

## 2020-05-10 MED ORDER — TETANUS-DIPHTH-ACELL PERTUSSIS 5-2.5-18.5 LF-MCG/0.5 IM SUSY: 0.5000 mL | PREFILLED_SYRINGE | Freq: Once | INTRAMUSCULAR | Status: DC

## 2020-05-10 MED ORDER — KETOROLAC TROMETHAMINE 30 MG/ML IJ SOLN
INTRAMUSCULAR | Status: AC
  Filled 2020-05-10: qty 1

## 2020-05-10 MED ORDER — GABAPENTIN 300 MG PO CAPS
ORAL_CAPSULE | ORAL | Status: AC
  Filled 2020-05-10: qty 1

## 2020-05-10 MED ORDER — ACETAMINOPHEN 500 MG PO TABS
1000.0000 mg | ORAL_TABLET | ORAL | Status: AC
  Administered 2020-05-10: 09:00:00 1000 mg via ORAL

## 2020-05-10 MED ORDER — SODIUM CHLORIDE 0.9 % IV SOLN
INTRAVENOUS | Status: AC
  Filled 2020-05-10: qty 2

## 2020-05-10 MED ORDER — OXYTOCIN-SODIUM CHLORIDE 30-0.9 UT/500ML-% IV SOLN
INTRAVENOUS | Status: DC | PRN
  Administered 2020-05-10: 400 m[IU]/min via INTRAVENOUS

## 2020-05-10 MED ORDER — ZOLPIDEM TARTRATE 5 MG PO TABS: 5.0000 mg | ORAL_TABLET | Freq: Every evening | ORAL | Status: DC | PRN

## 2020-05-10 MED ORDER — SODIUM CHLORIDE 0.9% FLUSH: 3.0000 mL | INTRAVENOUS | Status: DC | PRN

## 2020-05-10 MED ORDER — ENOXAPARIN SODIUM 60 MG/0.6ML ~~LOC~~ SOLN
50.0000 mg | SUBCUTANEOUS | Status: DC
  Administered 2020-05-10 – 2020-05-11 (×2): 50 mg via SUBCUTANEOUS
  Filled 2020-05-10 (×2): qty 0.6

## 2020-05-10 MED ORDER — NALOXONE HCL 0.4 MG/ML IJ SOLN: 0.4000 mg | INTRAMUSCULAR | Status: DC | PRN

## 2020-05-10 MED ORDER — DIPHENHYDRAMINE HCL 25 MG PO CAPS
25.0000 mg | ORAL_CAPSULE | Freq: Four times a day (QID) | ORAL | Status: DC | PRN
  Filled 2020-05-10 (×2): qty 1

## 2020-05-10 MED ORDER — SODIUM CHLORIDE 0.9 % IR SOLN
Status: DC | PRN
  Administered 2020-05-10: 1

## 2020-05-10 SURGICAL SUPPLY — 30 items
CHLORAPREP W/TINT 26ML (MISCELLANEOUS) ×3 IMPLANT
CLAMP CORD UMBIL (MISCELLANEOUS) IMPLANT
CLOTH BEACON ORANGE TIMEOUT ST (SAFETY) ×3 IMPLANT
DRSG OPSITE POSTOP 4X10 (GAUZE/BANDAGES/DRESSINGS) ×3 IMPLANT
ELECT REM PT RETURN 9FT ADLT (ELECTROSURGICAL) ×3
ELECTRODE REM PT RTRN 9FT ADLT (ELECTROSURGICAL) ×2 IMPLANT
EXTRACTOR VACUUM M CUP 4 TUBE (SUCTIONS) IMPLANT
GLOVE BIOGEL PI IND STRL 7.0 (GLOVE) ×6 IMPLANT
GLOVE BIOGEL PI INDICATOR 7.0 (GLOVE) ×3
GLOVE ECLIPSE 7.0 STRL STRAW (GLOVE) ×3 IMPLANT
GOWN STRL REUS W/TWL LRG LVL3 (GOWN DISPOSABLE) ×6 IMPLANT
KIT ABG SYR 3ML LUER SLIP (SYRINGE) IMPLANT
NEEDLE HYPO 22GX1.5 SAFETY (NEEDLE) ×3 IMPLANT
NEEDLE HYPO 25X5/8 SAFETYGLIDE (NEEDLE) ×3 IMPLANT
NS IRRIG 1000ML POUR BTL (IV SOLUTION) ×3 IMPLANT
PACK C SECTION WH (CUSTOM PROCEDURE TRAY) ×3 IMPLANT
PAD ABD 7.5X8 STRL (GAUZE/BANDAGES/DRESSINGS) ×3 IMPLANT
PAD OB MATERNITY 4.3X12.25 (PERSONAL CARE ITEMS) ×3 IMPLANT
PENCIL SMOKE EVAC W/HOLSTER (ELECTROSURGICAL) ×3 IMPLANT
RTRCTR C-SECT PINK 25CM LRG (MISCELLANEOUS) IMPLANT
SPONGE GAUZE 4X4 12PLY STER LF (GAUZE/BANDAGES/DRESSINGS) ×6 IMPLANT
SUT PDS AB 0 CTX 36 PDP370T (SUTURE) IMPLANT
SUT PLAIN 2 0 XLH (SUTURE) IMPLANT
SUT VIC AB 0 CTX 36 (SUTURE) ×9
SUT VIC AB 0 CTX36XBRD ANBCTRL (SUTURE) ×6 IMPLANT
SUT VIC AB 4-0 KS 27 (SUTURE) ×3 IMPLANT
SYR CONTROL 10ML LL (SYRINGE) ×3 IMPLANT
TOWEL OR 17X24 6PK STRL BLUE (TOWEL DISPOSABLE) ×3 IMPLANT
TRAY FOLEY W/BAG SLVR 14FR LF (SET/KITS/TRAYS/PACK) ×3 IMPLANT
WATER STERILE IRR 1000ML POUR (IV SOLUTION) ×3 IMPLANT

## 2020-05-10 NOTE — Anesthesia Preprocedure Evaluation (Addendum)
Anesthesia Evaluation  Patient identified by MRN, date of birth, ID band Patient awake    Reviewed: Allergy & Precautions, NPO status , Patient's Chart, lab work & pertinent test results  Airway Mallampati: II  TM Distance: >3 FB Neck ROM: Full    Dental  (+) Teeth Intact   Pulmonary    Pulmonary exam normal        Cardiovascular negative cardio ROS   Rhythm:Regular Rate:Normal     Neuro/Psych negative neurological ROS  negative psych ROS   GI/Hepatic negative GI ROS, Neg liver ROS,   Endo/Other  negative endocrine ROS  Renal/GU negative Renal ROS  negative genitourinary   Musculoskeletal negative musculoskeletal ROS (+)   Abdominal (+)  Abdomen: soft. Bowel sounds: normal.  Peds  Hematology  (+) anemia ,   Anesthesia Other Findings   Reproductive/Obstetrics (+) Pregnancy                            Anesthesia Physical Anesthesia Plan  ASA: II  Anesthesia Plan: Spinal   Post-op Pain Management:    Induction:   PONV Risk Score and Plan: 2 and Ondansetron, Dexamethasone and Treatment may vary due to age or medical condition  Airway Management Planned: Simple Face Mask, Natural Airway and Nasal Cannula  Additional Equipment: None  Intra-op Plan:   Post-operative Plan:   Informed Consent: I have reviewed the patients History and Physical, chart, labs and discussed the procedure including the risks, benefits and alternatives for the proposed anesthesia with the patient or authorized representative who has indicated his/her understanding and acceptance.     Dental advisory given  Plan Discussed with: CRNA  Anesthesia Plan Comments: (Lab Results      Component                Value               Date                      WBC                      10.7 (H)            05/08/2020                HGB                      11.8 (L)            05/08/2020                HCT                       35.1 (L)            05/08/2020                MCV                      93.6                05/08/2020                PLT                      202  05/08/2020           Lab Results      Component                Value               Date                      NA                       132 (L)             03/18/2020                K                        3.5                 03/18/2020                CO2                      23                  03/18/2020                GLUCOSE                  82                  03/18/2020                BUN                      5 (L)               03/18/2020                CREATININE               0.55                03/18/2020                CALCIUM                  8.7 (L)             03/18/2020                GFRNONAA                 >60                 03/18/2020                GFRAA                    >60                 02/22/2020          )        Anesthesia Quick Evaluation

## 2020-05-10 NOTE — Anesthesia Procedure Notes (Signed)
Spinal  Patient location during procedure: OB Start time: 05/10/2020 9:35 AM End time: 05/10/2020 9:36 AM Staffing Performed: anesthesiologist  Anesthesiologist: Atilano Median, DO Preanesthetic Checklist Completed: patient identified, IV checked, site marked, risks and benefits discussed, surgical consent, monitors and equipment checked, pre-op evaluation and timeout performed Spinal Block Patient position: sitting Prep: DuraPrep Patient monitoring: heart rate, cardiac monitor, continuous pulse ox and blood pressure Approach: midline Location: L3-4 Injection technique: single-shot Needle Needle type: Pencan  Needle gauge: 24 G Needle length: 10 cm Assessment Sensory level: T4 Additional Notes Patient identified. Risks/Benefits/Options discussed with patient including but not limited to bleeding, infection, nerve damage, paralysis, failed block, incomplete pain control, headache, blood pressure changes, nausea, vomiting, reactions to medications, itching and postpartum back pain. Confirmed with bedside nurse the patient's most recent platelet count. Confirmed with patient that they are not currently taking any anticoagulation, have any bleeding history or any family history of bleeding disorders. Patient expressed understanding and wished to proceed. All questions were answered. Sterile technique was used throughout the entire procedure. Please see nursing notes for vital signs. Warning signs of high block given to the patient including shortness of breath, tingling/numbness in hands, complete motor block, or any concerning symptoms with instructions to call for help. Patient was given instructions on fall risk and not to get out of bed. All questions and concerns addressed with instructions to call with any issues or inadequate analgesia.

## 2020-05-10 NOTE — H&P (Signed)
OBSTETRIC ADMISSION HISTORY AND PHYSICAL  Paula Gamble is a 24 y.o. female [phone] with IUP at [redacted]w[redacted]d by early u/s presenting for elective rLTCS. She reports +FMs, No LOF, no VB, no blurry vision, headaches or peripheral edema, and RUQ pain.  She plans on breast and bottle feeding. She request post placental liletta for birth control. She received her prenatal care at [facility]   Dating: By early u/s --->  Estimated Date of Delivery: 05/17/20  Sono:    12/26/19@[redacted]w[redacted]d , CWD, normal anatomy, breech presentation, 341g, 82% EFW   Prenatal History/Complications:  GBS bacteruria Language barrier (spanish speaking) Cholelithiasis  Past Medical History: Past Medical History:  Diagnosis Date  . Abnormal Pap smear of cervix 01/06/2017   LSIL - Needs repeat 04/2017  . COVID-19 04/2019  . Pyelonephritis     Past Surgical History: Past Surgical History:  Procedure Laterality Date  . ABDOMINOPLASTY    . CESAREAN SECTION N/A 11/25/2016   Procedure: CESAREAN SECTION;  Surgeon: Lazaro Arms, MD;  Location: [facility] BIRTHING SUITES;  Service: Obstetrics;  Laterality: N/A;  . DILATION AND CURETTAGE OF UTERUS     x2    Obstetrical History: OB History    Gravida  4   Para  1   Term  1   Preterm      AB  2   Living  1     SAB  2   IAB      Ectopic      Multiple  0   Live Births  1           Social History Social History   Socioeconomic History  . Marital status: Married    Spouse name: Not on file  . Number of children: Not on file  . Years of education: Not on file  . Highest education level: Not on file  Occupational History  . Not on file  Tobacco Use  . Smoking status: Never Smoker  . Smokeless tobacco: Never Used  Vaping Use  . Vaping Use: Former  Substance and Sexual Activity  . Alcohol use: Not Currently    Comment: not since January  . Drug use: Not Currently    Types: Marijuana    Comment: last used before 09/19/19   . Sexual activity: Yes    Birth  control/protection: None  Other Topics Concern  . Not on file  Social History Narrative  . Not on file   Social Determinants of Health   Financial Resource Strain: Not on file  Food Insecurity: No Food Insecurity  . Worried About Programme researcher, broadcasting/film/video in the Last Year: Never true  . Ran Out of Food in the Last Year: Never true  Transportation Needs: No Transportation Needs  . Lack of Transportation (Medical): No  . Lack of Transportation (Non-Medical): No  Physical Activity: Not on file  Stress: Not on file  Social Connections: Not on file    Family History: Family History  Problem Relation Age of Onset  . Diabetes Paternal Uncle   . Cancer Maternal Grandmother   . Diabetes Maternal Grandmother   . Hypertension Maternal Grandmother   . Cancer Paternal Grandmother        liver    Allergies: Allergies  Allergen Reactions  . Latex Itching    Medications Prior to Admission  Medication Sig Dispense Refill Last Dose  . acetaminophen (TYLENOL) 500 MG tablet Take 1,000 mg by mouth every 6 (six) hours as needed (for pain.).     Marland Kitchen  calcium carbonate (TUMS - DOSED IN MG ELEMENTAL CALCIUM) 500 MG chewable tablet Chew 1 tablet by mouth 3 (three) times daily as needed for indigestion or heartburn.     . famotidine (PEPCID) 20 MG tablet Take 1 tablet (20 mg total) by mouth 2 (two) times daily. (Patient taking differently: Take 20 mg by mouth 2 (two) times daily as needed for heartburn.) 60 tablet 2   . Prenatal Vit-Fe Fumarate-FA (MULTIVITAMIN-PRENATAL) 27-0.8 MG TABS tablet Take 1 tablet by mouth at bedtime.        Review of Systems   All systems reviewed and negative except as stated in HPI  Blood pressure 126/67, pulse 95, temperature 98.4 F (36.9 C), temperature source Oral, resp. rate 18, height 5\' 4"  (1.626 m), weight 93.9 kg, last menstrual period 08/04/2019, SpO2 99 %, currently breastfeeding.  FHR 154 bpm General appearance: alert, cooperative and no distress Lungs:  normal respiratory effort Heart: regular rate and rhythm Abdomen: soft, non-tender; gravid Pelvic: not indicated Extremities: Homans sign is negative, no sign of DVT    Prenatal labs: ABO, Rh: --/--/O POS (12/16 05-03-1996) Antibody: NEG (12/16 0951) Rubella: 8.24 (06/10 1206) RPR: NON REACTIVE (12/16 0947)  HBsAg: Negative (06/10 1206)  HIV: Non Reactive (09/30 0850)  GBS:   positive urine 11/01/19 2 hr Glucola passed Genetic screening  normal Anatomy 01/01/20 normal  Prenatal Transfer Tool  Maternal Diabetes: No Genetic Screening: Normal Maternal Ultrasounds/Referrals: Normal Fetal Ultrasounds or other Referrals:  None Maternal Substance Abuse:  No Significant Maternal Medications:  None Significant Maternal Lab Results: Group B Strep positive  Results for orders placed or performed during the hospital encounter of 05/08/20 (from the past 72 hour(s))  SARS CORONAVIRUS 2 (TAT 6-24 HRS) Nasopharyngeal Nasopharyngeal Swab     Status: None   Collection Time: 05/08/20 10:46 AM   Specimen: Nasopharyngeal Swab  Result Value Ref Range   SARS Coronavirus 2 NEGATIVE NEGATIVE    Comment: (NOTE) SARS-CoV-2 target nucleic acids are NOT DETECTED.  The SARS-CoV-2 RNA is generally detectable in upper and lower respiratory specimens during the acute phase of infection. Negative results do not preclude SARS-CoV-2 infection, do not rule out co-infections with other pathogens, and should not be used as the sole basis for treatment or other patient management decisions. Negative results must be combined with clinical observations, patient history, and epidemiological information. The expected result is Negative.  Fact Sheet for Patients: 05/10/20  Fact Sheet for Healthcare Providers: HairSlick.no  This test is not yet approved or cleared by the quierodirigir.com FDA and  has been authorized for detection and/or diagnosis of SARS-CoV-2  by FDA under an Emergency Use Authorization (EUA). This EUA will remain  in effect (meaning this test can be used) for the duration of the COVID-19 declaration under Se ction 564(b)(1) of the Act, 21 U.S.C. section 360bbb-3(b)(1), unless the authorization is terminated or revoked sooner.  Performed at [facility], [address] [place], [place]      Patient Active Problem List   Diagnosis Date Noted  . S/P cesarean section 05/10/2020  . History of ileus 03/06/2020  . Gallstone 03/06/2020  . Anemia in pregnancy 11/17/2019  . GBS (group B streptococcus) UTI complicating pregnancy 11/08/2019  . Supervision of low-risk pregnancy 10/31/2019  . Language barrier 10/31/2019  . History of COVID-19 10/31/2019  . History of pyelonephritis during pregnancy 10/31/2019  . History of C-section 10/31/2019  . Obesity during pregnancy 10/31/2019    Assessment/Plan:  12/31/2019  Paula Gamble is a 24 y.o. [id number] at [redacted]w[redacted]d here for elective rLTCS.  The risks of surgery were discussed with the patient including but were not limited to: bleeding which may require transfusion or reoperation; infection which may require antibiotics; injury to bowel, bladder, ureters or other surrounding organs; injury to the fetus; need for additional procedures including hysterectomy in the event of a life-threatening hemorrhage; formation of adhesions; placental abnormalities wth subsequent pregnancies; incisional problems; thromboembolic phenomenon and other postoperative/anesthesia complications.   For the postplacental IUD placement, discussed risks of postpartum expulsion, irregular bleeding, cramping, infection, malpositioning or misplacement of the IUD which may require further procedures. Also discussed >99% contraception efficacy, increased risk of ectopic pregnancy with failure of method.   The patient concurred with the proposed plan, giving informed written consent for the procedures.     #Pain: spinal #ID: GBS pos, given mode of delivery intra-op abx #MOF: both #MOC: post placental liletta #Circ: n/a  Jaynie Collins, MD  05/10/2020, 8:53 AM

## 2020-05-10 NOTE — Anesthesia Postprocedure Evaluation (Signed)
Anesthesia Post Note  Patient: Paula Gamble  Procedure(s) Performed: CESAREAN SECTION (N/A ) INTRAUTERINE DEVICE (IUD) INSERTION     Patient location during evaluation: Mother Baby Anesthesia Type: Spinal Level of consciousness: oriented and awake and alert Pain management: pain level controlled Vital Signs Assessment: post-procedure vital signs reviewed and stable Respiratory status: spontaneous breathing and respiratory function stable Cardiovascular status: blood pressure returned to baseline and stable Postop Assessment: no headache, no backache, no apparent nausea or vomiting and able to ambulate Anesthetic complications: no   No complications documented.  Last Vitals:  Vitals:   05/10/20 1229 05/10/20 1328  BP: (!) 99/56 (!) 111/59  Pulse: 62 63  Resp: 16 18  Temp: 36.4 C 36.8 C  SpO2: 100% 98%    Last Pain:  Vitals:   05/10/20 1328  TempSrc: Axillary  PainSc:                  Nelle Don Laddie Math

## 2020-05-10 NOTE — Lactation Note (Signed)
This note was copied from a baby's chart. Lactation Consultation Note  Patient Name: Paula Gamble [id number] Date: 05/10/2020 Reason for consult: Initial assessment  Initial visit to 8 hours old infant of a P2 mother with breastfeeding experience. Infant is latched to right breast, cradle hold upon arrival.  Mother states breastfeeding is going well. Observed a good latch but poor alignment. Talked to parents about good alignment and positioning to improve milk transfer. Also, discussed undressing when ready to feed to keep infant awake at breast.    Reviewed with mother average size of a NB stomach. Encourage to follow babies' hunger and fullness cues. Reviewed importance to offer the breast 8 to 12 times in a 24-hour period for proper stimulation and to establish good milk supply. Reviewed breastfeeding basics. Discussed milk coming to volume. Reviewed newborn behavior and expectations with parents. Reinforced skin to skin benefits and assisted to place infant skin to skin with father.   Encouraged to contact [facility] for support, questions or concerns. All questions answered at this time. Promoted INJoy booklet and provided LC brochure.     Maternal Data Does the patient have breastfeeding experience prior to this delivery?: Yes  Feeding Feeding Type: Breast Fed  LATCH Score Latch: Grasps breast easily, tongue down, lips flanged, rhythmical sucking.  Audible Swallowing: Spontaneous and intermittent  Type of Nipple: Everted at rest and after stimulation  Comfort (Breast/Nipple): Soft / non-tender  Hold (Positioning): Assistance needed to correctly position infant at breast and maintain latch.  LATCH Score: 9  Interventions Interventions: Breast feeding basics reviewed;Assisted with latch;Skin to skin;Adjust position;Support pillows  Lactation Tools Discussed/Used WIC Program: Yes   Consult Status Consult Status: Follow-up Date: 05/11/20 Follow-up type:  In-patient    Paula Gamble A Paula Gamble 05/10/2020, 6:27 PM

## 2020-05-10 NOTE — Discharge Instructions (Signed)
Colocacin de un dispositivo intrauterino Intrauterine Device Insertion Un dispositivo intrauterino (DIU) es un dispositivo mdico que se coloca en el tero para impedir Firefighter. Es un dispositivo pequeo en forma de "T" del que cuelgan uno o dos hilos de [place]. Los hilos cuelgan de la parte inferior del tero (cuello uterino) para que el DIU pueda extraerse en el futuro. Hay dos tipos de DIU:  DIU de cobre. Este tipo de DIU est envuelto por un alambre de cobre. El cobre hace que el tero y las trompas de [place]opio produzcan un lquido que [employer] espermatozoides. Un DIU de cobre puede durar hasta 10aos.  DIU hormonal. Este tipo de DIU est hecho de plstico y contiene la hormona progestina (progesterona sinttica). Esta hormona hace que la mucosidad del cuello uterino se haga ms espesa y evita que el esperma ingrese en el tero. Tambin hace que el endometrio sea ms delgado, lo que impide la implantacin del vulo fertilizado. La hormona debilita o destruye los espermatozoides que ingresan al tero. Un DIU hormonal puede durar entre 3 y 64aos. Informe al mdico acerca de lo siguiente:  Cualquier alergia que tenga.  Todos los Walt Disney, incluidos vitaminas, hierbas, gotas oftlmicas, cremas y [address] de [address].  Cualquier problema que usted o sus familiares hayan tenido con anestsicos.  Cualquier enfermedad de la sangre que tenga.  Cirugas a las que se someti.  Todas las afecciones que tenga, incluidas las ITS (infecciones de transmisin sexual) que Production designer, theatre/television/film.  Si est embarazada o podra estarlo. Cules son los riesgos? En general, se trata de un procedimiento seguro. Sin embargo, pueden ocurrir complicaciones, por ejemplo:  Infeccin.  Hemorragia.  Reacciones alrgicas a los medicamentos.  Puncin accidental (perforacin) en el tero o dao a otras estructuras u otros rganos.  La colocacin accidental del DIU en la capa muscular del  tero (miometrio) o fuera del tero.  Que el DIU se salga del tero (expulsin). Esto es ms frecuente en las mujeres que han dado a Jenese recientemente.  Un embarazo que ocurre en las trompas de Falopio (embarazo ectpico).  Infeccin del tero y de las trompas de Falopio (enfermedad plvica inflamatoria). Qu ocurre antes del procedimiento?  Programe la colocacin del DIU para cuando tenga el perodo menstrual o inmediatamente despus para asegurarse de que no est embarazada. La colocacin del DIU se tolera mejor poco despus del ciclo menstrual.  Siga las indicaciones del mdico respecto de las restricciones de comidas o bebidas.  Consulte a su mdico si debe cambiar o suspender los medicamentos que toma habitualmente. Esto es muy importante si toma medicamentos para la diabetes o anticoagulantes.  Quizs le indiquen que tome un analgsico antes del procedimiento.  Es posible que deba hacerse estudios de lo siguiente: ? Psychiatrist. Neomia Dear prueba de Photographer una muestra de [place]. ? ITS. Colocar un DIU a una mujer que tiene una ITS puede empeorar la afeccin. ? Cncer de cuello uterino. Pueden realizarle una prueba de Papanicolaou para descartar este tipo de cncer. Para ello, se toman clulas del cuello uterino, que luego se examinan con un microscopio.  Es posible que Production manager un examen fsico para Therapist, occupational y la posicin del tero. Este procedimiento puede variar segn el mdico y el hospital. Ladell Heads ocurre durante el procedimiento?  Le colocarn un instrumento (espculo) en la vagina, y este se ensanchar para que el mdico pueda ver el cuello uterino.  Es posible que le coloquen medicamentos en el cuello uterino  para disminuir el riesgo de contraer una infeccin (antisptico).  Tal vez le administren un anestsico para adormecer cada lado del cuello uterino (bloqueo intracervical o bloqueo paracervical). Este medicamento suele administrarse mediante una  inyeccin en el cuello uterino.  Se introducir un instrumento (sonda uterina) en el tero para determinar la longitud del tero y la direccin hacia la cual el tero podra estar inclinado.  Se introducir un instrumento o un tubo delgado (insertador de DIU) que sostiene el DIU en la vagina, a travs del conducto endocervical, hasta el tero.  El DIU se colocar en el tero, y el insertador de DIU se Brewing technologist.  Los hilos unidos al DIU se cortarn para que queden justo debajo del cuello uterino. Este procedimiento puede variar segn el mdico y el hospital. Ladell Heads ocurre despus del procedimiento?  Podr tener sangrado despus del procedimiento. Esto es normal. Vara desde un goteo ligero (manchado) durante un par de [employer] un sangrado similar al de la [place].  Puede sentir clicos y Engineer, mining.  Puede sentirse mareada o aturdida.  Puede sentir dolor en la parte inferior de la espalda. Resumen  Un dispositivo intrauterino (DIU) es un dispositivo pequeo en forma de "T" del que cuelgan uno o dos hilos de [place].  Hay dos tipos de DIU. Pueden colocarle un DIU de cobre o un DIU hormonal.  Programe la colocacin del DIU para cuando tenga el perodo menstrual o inmediatamente despus para asegurarse de que no est embarazada. La colocacin del DIU se tolera mejor poco despus del ciclo menstrual.  Podr tener sangrado despus del procedimiento. Esto es normal. Vara desde un goteo ligero durante un par de [employer] un sangrado similar al menstrual. Esta informacin no tiene Theme park manager el consejo del mdico. Asegrese de hacerle al mdico cualquier pregunta que tenga. Document Revised: 02/10/2017 Document Reviewed: 02/10/2017 Elsevier Patient Education  2020 Elsevier Inc.   Cuidados en el postparto luego de un parto por cesrea Postpartum Care After Cesarean Delivery Lea esta informacin sobre cmo cuidarse desde el momento en que nazca su beb y Elaina Hoops 6 a 12 semanas  despus del parto (perodo del posparto). El mdico tambin podr darle indicaciones ms especficas. Comunquese con el mdico si tiene problemas o preguntas. Siga estas indicaciones en su casa: Medicamentos  [employer] de venta libre y los recetados solamente como se lo haya indicado el mdico.  Si le recetaron un antibitico, tmelo como se lo haya indicado el mdico. No deje de tomar el antibitico aunque comience a sentirse mejor.  Pregntele al mdico si el medicamento recetado: ? Hace que sea necesario que evite conducir o usar maquinaria pesada. ? Puede causarle estreimiento. Es posible que deba tomar medidas para prevenir o tratar el estreimiento, por ejemplo:  Beber suficiente lquido como para Pharmacologist la orina de color amarillo plido.  Tomar medicamentos recetados o de [employer].  Consumir alimentos ricos en fibra, como frijoles, cereales integrales, y frutas y verduras frescas.  Limitar su consumo de alimentos ricos en grasa y azcares procesados, como los alimentos fritos o dulces. Actividad  Retome sus actividades normales de a poco segn lo indicado por el mdico.  Evite las actividades que demandan mucho esfuerzo y Engineer, drilling (que son extenuantes) hasta que el mdico se lo autorice. Siempre es ms seguro caminar a un ritmo tranquilo a moderado. Pregntele al mdico qu actividades son seguras para usted. ? No levante objetos que pesen ms que su beb o ms de 10 libras (4,5 kg), como  se lo haya indicado el mdico. ? No pase la aspiradora, suba escaleras ni conduzca un vehculo durante el tiempo que le indique el mdico.  De ser posible, pdale a alguien que le brinde ayuda con las tareas domsticas hasta que pueda realizar sus actividades habituales por su cuenta.  Descanse todo lo que pueda. Trate de descansar o tomar una siesta mientras el beb duerme. Hemorragia vaginal  Es normal tener un poco de hemorragia vaginal (loquios) despus del parto. Use un  apsito sanitario para absorber el sangrado vaginal y la secrecin. ? Durante la primera semana despus del parto, la cantidad y el aspecto de los loquios a menudo es similar a las del perodo menstrual. ? Durante las siguientes semanas disminuir gradualmente hasta convertirse en una secrecin seca amarronada o [place]. ? En la [employer], los loquios se detienen [employer] 4 a 6semanas despus del [place]. Los sangrados vaginales pueden variar de mujer a Nurse, learning disability.  Cambie los apsitos sanitarios con frecuencia. Observe si hay cambios en el flujo, como: ? Aumento repentino en el volumen. ? Cambio en el color. ? Cogulos sanguneos grandes.  Si expulsa un cogulo de sangre, gurdelo y llame al mdico para informrselo. No deseche los cogulos de sangre por el inodoro antes de recibir indicaciones del mdico.  No use tampones ni se haga duchas vaginales hasta que el mdico la autorice.  Si no est amamantando, volver a tener su perodo entre 6 y 8 semanas despus del parto. Si est amamantando, puede volver a tener su perodo [place] 8 semanas despus del parto y el momento en que deje de Museum/gallery exhibitions officer. Cuidados perineales   Si su cesrea no fue planeada, y pas por el proceso de [place] de parto y puj antes del nacimiento, podra Surveyor, mining, hinchazn y Associate Professor del tejido que se encuentra entre la abertura de la vagina y el ano (perineo). Tambin pueden haberle hecho una incisin en el tejido (episiotoma) o el tejido puede haberse desgarrado durante el [place]. Siga las siguientes indicaciones como se lo haya indicado su mdico: ? Mantenga el perineo limpio y Personnel officer se lo haya indicado el mdico. Utilice apsitos o aerosoles analgsicos y Control and instrumentation engineer, como se lo hayan indicado. ? Si le realizaron una episiotoma o un desgarro vaginal, controle la zona [employer] para detectar signos de infeccin. Est atento a los siguientes signos:  Enrojecimiento, Oceanographer.  Lquido o sangre.  Calor.  Pus o mal olor. ? Es posible que le den una botella rociadora para que use en lugar de limpiarse el rea con papel higinico despus de usar el bao. Cuando comience a Barrister's clerk, podr usar la botella rociadora antes de secarse. Asegrese de secarse suavemente. ? Para Engineer, materials causado por una episiotoma, un desgarro vaginal o hemorroides, trate de tomar un bao de asiento tibio 2 o 3 veces por da. Un bao de asiento es un bao de agua tibia que se toma mientras se est sentado. El agua solo debe Adult nurse las caderas y cubrir las nalgas. Cuidado de las [address]  En los [address] despus del parto, las mamas pueden sentirse pesadas, llenas e incmodas (congestin [place]). Tambin puede tener Mirant se escapa de sus senos. El mdico puede sugerirle mtodos para Systems analyst. La congestin mamaria debera desaparecer al cabo de [employer].  Si est amamantando: ? Use un sostn que sujete y ajuste bien sus pechos. ? Mantenga los pezones secos y limpios. Aplquese cremas  y [employer] se lo haya indicado el mdico. ? Es posible que deba usar discos de algodn en el sostn para Insurance account manager [employer] se filtre. ? Puede tener contracciones uterinas cada vez que amamante durante varias semanas despus del [place]. Las contracciones uterinas ayudan al tero a Hotel manager a su tamao habitual. ? Si tiene algn problema con la lactancia materna, consulte con su mdico o con un Holiday representative.  Si no est amamantando: ? Evite tocarse las [address], ya que esto puede hacer que produzcan ms [place]. ? Use un sostn que le proporcione el ajuste correcto y compresas fras para reducir la hinchazn. ? No extraiga (saque) [employer]. Esto har que produzca ms [employer]. Intimidad y sexualidad  Pregntele al mdico cundo puede retomar la actividad sexual. Esto puede depender de lo siguiente: ? Riesgo de sufrir una infeccin. ? Velocidad de  cicatrizacin. ? Comodidad y deseo de [employer] sexual.  Despus del parto, puede quedar embarazada incluso si no ha tenido todava su perodo. Si lo desea, hable con el mdico acerca de los mtodos de planificacin familiar o control de la natalidad (mtodos anticonceptivos). Estilo de vida  No consuma ningn producto que contenga nicotina o tabaco, como cigarrillos, cigarrillos electrnicos y tabaco de Theatre manager. Si necesita ayuda para dejar de consumir, consulte al mdico.  No beba alcohol, especialmente si est amamantando. Comida y bebida   Beba suficiente lquido como para Pharmacologist la orina de color amarillo plido.  Coma alimentos ricos en [employer]. Estos pueden ayudarla a prevenir o Educational psychologist. Los alimentos ricos en fibra incluyen los siguientes: ? Panes y cereales integrales. ? Arroz integral. ? Armed forces operational officer. ? Nils Pyle y verduras frescas.  Tome sus vitaminas prenatales hasta la visita de control de posparto o hasta que su mdico le indique que puede dejar de tomarlas. Indicaciones generales  Concurra a todas las visitas de control para usted y el beb como se lo haya indicado el mdico. La mayora de las mujeres visita al mdico para un control de posparto dentro de las primeras 3 a 6 semanas despus del parto. Comunquese con un mdico si:  Se siente incapaz de controlar los cambios que implica tener un beb recin nacido y esos sentimientos no desaparecen.  Siente tristeza o preocupacin de forma inusual.  Siente dolor en las mamas, o estn duras o enrojecidas.  Tiene fiebre.  Tiene dificultad para retener la [place] o para impedir que la orina se escape.  Tiene poco inters o falta de inters en actividades que solan gustarle.  No ha amamantado para nada y no ha tenido un perodo menstrual durante 12 semanas despus del [place].  Dej de amamantar al beb y no ha tenido su perodo menstrual durante 12 semanas despus de dejar de  Museum/gallery exhibitions officer.  Tiene preguntas sobre su cuidado o el del beb.  Elimina un cogulo de sangre por la vagina. Solicite ayuda inmediatamente si:  Midwife.  Presenta dificultad para respirar.  Tiene un dolor repentino e intenso en la pierna.  Tiene dolor intenso o clicos en el abdomen.  Tiene un sangrado tan intenso en la vagina que empapa ms de un apsito sanitario en [employer]. El sangrado no debe ser ms abundante que el perodo ms intenso que haya tenido.  Presenta dolor de cabeza intenso.  Se desmaya.  Tiene visin borrosa o Nurse, adult.  Tiene secrecin vaginal con mal olor.  Tiene pensamientos de autolesionarse o de Engineer, drilling  al beb. Si alguna vez siente que puede lastimarse o Physicist, medicallastimar a Economistotras personas, o tiene pensamientos de poner fin a su vida, busque ayuda de inmediato. Puede dirigirse al servicio de emergencias ms cercano o comunicarse con:  El servicio de emergencias de su localidad (911 en [place]  Una lnea de asistencia al suicida y Visual merchandiseratencin en crisis, como National Suicide Prevention Lifeline (Lnea Nacional de Prevencin del Suicidio), al [phone]. Est disponible las 24 horas del da. Resumen  El perodo de tiempo desde el parto y Elaina [place] 6 a 12 semanas despus del parto se denomina perodo posparto.  Retome sus actividades normales de a poco segn lo indicado por el mdico.  Concurra a todas las visitas de control para usted y Research scientist (physical sciences)el beb como se lo haya indicado el mdico. Esta informacin no tiene Theme park managercomo fin reemplazar el consejo del mdico. Asegrese de hacerle al mdico cualquier pregunta que tenga. Document Revised: 02/08/2018 Document Reviewed: 02/08/2018 Elsevier Patient Education  2020 [employer]vier Inc.

## 2020-05-10 NOTE — Transfer of Care (Signed)
Immediate Anesthesia Transfer of Care Note  Patient: Paula Gamble  Procedure(s) Performed: CESAREAN SECTION (N/A ) INTRAUTERINE DEVICE (IUD) INSERTION  Patient Location: PACU  Anesthesia Type:Spinal  Level of Consciousness: awake, alert , oriented and patient cooperative  Airway & Oxygen Therapy: Patient Spontanous Breathing  Post-op Assessment: Report given to RN and Post -op Vital signs reviewed and stable  Post vital signs: Reviewed and stable  Last Vitals:  Vitals Value Taken Time  BP 106/61 05/10/20 1105  Temp    Pulse 67 05/10/20 1106  Resp 12 05/10/20 1106  SpO2 100 % 05/10/20 1106  Vitals shown include unvalidated device data.  Last Pain:  Vitals:   05/10/20 0812  TempSrc: Oral  PainSc:          Complications: No complications documented.

## 2020-05-10 NOTE — Discharge Summary (Signed)
Postpartum Discharge Summary   Patient Name: Paula Gamble DOB: July 01, 1995 MRN: [id number]  Date of admission: 05/10/2020 Delivery date:05/10/2020  Delivering provider: Arrie Senate  Date of discharge: 05/12/2020  Admitting diagnosis: S/P cesarean section [Z98.891] Intrauterine pregnancy: [redacted]w[redacted]d     Secondary diagnosis:  Principal Problem:   S/P cesarean section Active Problems:   Supervision of low-risk pregnancy   Language barrier   History of C-section   History of ileus   Liletta IUD (intrauterine device) placed 05/10/2020  Additional problems: none    Discharge diagnosis: Term Pregnancy Delivered                                              Post partum procedures: Venofer infusion Augmentation: N/A Complications: None  Hospital course: Sceduled C/S   24 y.o. yo [id number] at [redacted]w[redacted]d was admitted to the hospital 05/10/2020 for scheduled cesarean section with the following indication:Elective Repeat.Delivery details are as follows:  Membrane Rupture Time/Date: 10:08 AM ,05/10/2020   Delivery Method:C-Section, Low Transverse  Details of operation can be found in separate operative note.  Patient had an uncomplicated postpartum course.  She is ambulating, tolerating a regular diet, passing flatus, and urinating well. Patient is discharged home in stable condition on  05/12/20        Newborn Data: Birth date:05/10/2020  Birth time:10:08 AM  Gender:Female  Living status:Living  Apgars:8 ,9  Weight:3566 g     Magnesium Sulfate received: No BMZ received: No Rhophylac:N/A MMR:N/A T-DaP:Given prenatally Flu: Yes Transfusion: S/p Venofer 05/11/2020  Physical exam  Vitals:   05/10/20 1105 05/10/20 1115 05/10/20 1130 05/10/20 1200  BP: 106/61 (!) 105/55 95/61 103/76  Pulse: 68 62 66 65  Resp: $Remo'12 15 11 12  'TDHpr$ Temp: 98.1 F (36.7 C)  98.1 F (36.7 C) 98 F (36.7 C)  TempSrc: Oral  Oral Oral  SpO2: 100% 100% 100% 100%  Weight:      Height:        General: alert, cooperative and no distress Lochia: appropriate Uterine Fundus: firm Incision: Healing well with no significant drainage, Honeycomb dressing has scant dried drainage, otherwise clean and intact DVT Evaluation: No evidence of DVT seen on physical exam. Labs: Lab Results  Component Value Date   WBC 10.7 (H) 05/08/2020   HGB 11.8 (L) 05/08/2020   HCT 35.1 (L) 05/08/2020   MCV 93.6 05/08/2020   PLT 202 05/08/2020   CMP Latest Ref Rng & Units 03/18/2020  Glucose 70 - 99 mg/dL 82  BUN 6 - 20 mg/dL 5(L)  Creatinine 0.44 - 1.00 mg/dL 0.55  Sodium 135 - 145 mmol/L 132(L)  Potassium 3.5 - 5.1 mmol/L 3.5  Chloride 98 - 111 mmol/L 101  CO2 22 - 32 mmol/L 23  Calcium 8.9 - 10.3 mg/dL 8.7(L)  Total Protein 6.5 - 8.1 g/dL 6.0(L)  Total Bilirubin 0.3 - 1.2 mg/dL 0.3  Alkaline Phos 38 - 126 U/L 90  AST 15 - 41 U/L 15  ALT 0 - 44 U/L 12   Edinburgh Score: Edinburgh Postnatal Depression Scale Screening Tool 01/06/2017  I have been able to laugh and see the funny side of things. 0  I have looked forward with enjoyment to things. 0  I have blamed myself unnecessarily when things went wrong. 0  I have been anxious or worried for no good reason. 0  I have felt scared or panicky for no good reason. 0  Things have been getting on top of me. 0  I have been so unhappy that I have had difficulty sleeping. 0  I have felt sad or miserable. 0  I have been so unhappy that I have been crying. 0  The thought of harming myself has occurred to me. 0  Edinburgh Postnatal Depression Scale Total 0     After visit meds:  Allergies as of 05/12/2020      Reactions   Latex Itching      Medication List    STOP taking these medications   calcium carbonate 500 MG chewable tablet Commonly known as: TUMS - dosed in mg elemental calcium   famotidine 20 MG tablet Commonly known as: PEPCID     TAKE these medications   acetaminophen 500 MG tablet Commonly known as: TYLENOL Take 2  tablets (1,000 mg total) by mouth every 6 (six) hours. What changed:   when to take this  reasons to take this   ibuprofen 800 MG tablet Commonly known as: ADVIL Take 1 tablet (800 mg total) by mouth every 6 (six) hours.   magnesium hydroxide 400 MG/5ML suspension Commonly known as: MILK OF MAGNESIA Take 30 mLs by mouth every three (3) days as needed for mild constipation (if no BM).   multivitamin-prenatal 27-0.8 MG Tabs tablet Take 1 tablet by mouth at bedtime.   oxyCODONE 5 MG immediate release tablet Commonly known as: Oxy IR/ROXICODONE Take 1 tablet (5 mg total) by mouth every 4 (four) hours as needed for up to 3 days for severe pain.   senna-docusate 8.6-50 MG tablet Commonly known as: Senokot-S Take 2 tablets by mouth daily.   witch hazel-glycerin pad Commonly known as: TUCKS Apply 1 application topically as needed for hemorrhoids.       Discharge home in stable condition Infant Feeding: Bottle Infant Disposition:home with mother Discharge instruction: per After Visit Summary and Postpartum booklet. Activity: Advance as tolerated. Pelvic rest for 6 weeks.  Diet: routine diet Future Appointments: Future Appointments  Date Time Provider Bennett  06/05/2020 10:45 AM [facility]   Follow up Visit: Message sent to [facility] 05/10/20 by Sylvester Harder.   Please schedule this patient for a In person postpartum visit in 4 weeks with the following provider: Any provider. Additional Postpartum F/U:Incision check 1 week  Low risk pregnancy complicated by: n/a Delivery mode:  C-Section, Low Transverse  Anticipated Birth Control:  PP IUD placed, string check at postpartum appt  Language barrier: [facility] interpreter Viria present for all discharge teaching with Sully, MSN, CNM Certified Nurse Midwife, [facility] for [employer], [facility] 05/12/20 9:10 AM

## 2020-05-10 NOTE — Op Note (Signed)
Regan Rakers Verlee Rossetti PROCEDURE DATE: 05/10/2020  PREOPERATIVE DIAGNOSES: Intrauterine pregnancy at [redacted]w[redacted]d weeks gestation; previous cesarean section and declines trial of labor; desires long term reversible contraception  POSTOPERATIVE DIAGNOSES: The same  PROCEDURE: Repeat Low Transverse Cesarean Section and Liletta Intrauterine Device Placement  SURGEON:  Dr. Jaynie Collins  ASSISTANT:  Dr. Mart Piggs  ANESTHESIOLOGY TEAM: Anesthesiologist: Nance Pew Nelle Don, DO CRNA: Shanon Payor, CRNA  INDICATIONS: Paula Gamble is a 24 y.o. [phone] at [redacted]w[redacted]d here for cesarean section secondary to the indications listed under preoperative diagnoses; please see preoperative note for further details.  The risks of surgery were discussed with the patient including but were not limited to: bleeding which may require transfusion Paula reoperation; infection which may require antibiotics; injury to bowel, bladder, ureters Paula other surrounding organs; injury to the fetus; need for additional procedures including hysterectomy in the event of a life-threatening hemorrhage; formation of adhesions; placental abnormalities wth subsequent pregnancies; incisional problems; thromboembolic phenomenon and other postoperative/anesthesia complications.  The patient concurred with the proposed plan, giving informed written consent for the procedure.    FINDINGS:  Viable female infant in cephalic presentation. Cord wrapped around leg twice.  Apgars 8 and 9, weight pending.  Clear amniotic fluid.  Intact placenta, three vessel cord.  Normal uterus, fallopian tubes and ovaries bilaterally. No intraperitoneal adhesive disease noted.  Liletta IUD placed.  ANESTHESIA: Spinal INTRAVENOUS FLUIDS: 2100 ml   ESTIMATED BLOOD LOSS: 700 ml URINE OUTPUT:  150 ml SPECIMENS: Placenta sent to L&D COMPLICATIONS: None immediate  PROCEDURE IN DETAIL:  The patient preoperatively received intravenous antibiotics and had  sequential compression devices applied to her lower extremities.  She was then taken to the operating room where spinal anesthesia was administered and was found to be adequate. She was then placed in a dorsal supine position with a leftward tilt, and prepped and draped in a sterile manner.  A foley catheter was placed into her bladder and attached to constant gravity.  After an adequate timeout was performed, a Pfannenstiel skin incision was made with scalpel on her preexisting scar and carried through to the underlying layer of fascia. The fascia was incised in the midline, and this incision was extended bilaterally using the Mayo scissors.  Kocher clamps were applied to the superior aspect of the fascial incision and the underlying rectus muscles were dissected off bluntly and sharply. The rectus muscles were separated in the midline and the peritoneum was entered sharply and bluntly. The Alexis self-retaining retractor was introduced into the abdominal cavity.  Attention was turned to the lower uterine segment where a low transverse hysterotomy was made with a scalpel and extended bilaterally bluntly.  The infant was successfully delivered, the cord was clamped and cut after one minute, and the infant was handed over to the awaiting neonatology team. Uterine massage was then administered, and the placenta delivered intact with a three-vessel cord. The uterus was then cleared of clots and debris.   The Liletta IUD was placed in the fundal region, and the strings were pushed through the lower uterine segment into cervix and upper vagina. The hysterotomy was closed with 0 Vicryl in a running locked fashion, and an imbricating layer was also placed with 0 Vicryl.  Figure-of-eight 0 Vicryl serosal stitches were placed to help with hemostasis.  The pelvis was cleared of all clot and debris. Hemostasis was confirmed on all surfaces.  The retractor was removed.  The peritoneum was closed with a 0 Vicryl running stitch.  The fascia was then closed using 0 PDS in a running fashion.  The subcutaneous layer was irrigated, reapproximated with 2-0 plain gut interrupted stitches, and the skin was closed with a 4-0 Vicryl subcuticular stitch. The patient tolerated the procedure well. Sponge, instrument and needle counts were correct x 3.  She was taken to the recovery room in stable condition.      Jaynie Collins, MD, FACOG Obstetrician & Gynecologist, [facility] for [employer], [facility]

## 2020-05-11 DIAGNOSIS — Z98891 History of uterine scar from previous surgery: Secondary | ICD-10-CM

## 2020-05-11 LAB — CBC
HCT: 26.7 % — ABNORMAL LOW (ref 36.0–46.0)
Hemoglobin: 8.5 g/dL — ABNORMAL LOW (ref 12.0–15.0)
MCH: 30.5 pg (ref 26.0–34.0)
MCHC: 31.8 g/dL (ref 30.0–36.0)
MCV: 95.7 fL (ref 80.0–100.0)
Platelets: 146 10*3/uL — ABNORMAL LOW (ref 150–400)
RBC: 2.79 MIL/uL — ABNORMAL LOW (ref 3.87–5.11)
RDW: 13.5 % (ref 11.5–15.5)
WBC: 9.6 10*3/uL (ref 4.0–10.5)
nRBC: 0 % (ref 0.0–0.2)

## 2020-05-11 LAB — CREATININE, SERUM
Creatinine, Ser: 0.61 mg/dL (ref 0.44–1.00)
GFR, Estimated: >60 mL/min (ref >60)

## 2020-05-11 MED ORDER — SODIUM CHLORIDE 0.9 % IV SOLN
INTRAVENOUS | Status: DC | PRN
  Administered 2020-05-11: 10:00:00 250 mL via INTRAVENOUS

## 2020-05-11 MED ORDER — SODIUM CHLORIDE 0.9 % IV SOLN
500.0000 mg | Freq: Once | INTRAVENOUS | Status: AC
  Administered 2020-05-11: 10:00:00 500 mg via INTRAVENOUS
  Filled 2020-05-11: qty 25

## 2020-05-11 NOTE — Lactation Note (Addendum)
This note was copied from a baby's chart. Lactation Consultation Note  Patient Name: Paula Gamble [id number] Date: 05/11/2020 Reason for consult: Follow-up assessment  Consult was done in Spanish:   Follow up to 30 hours old with 2.55% weight loss at the time of this visit. Mother states infant is not latching. Mother states she is using ~69mL of formula to supplement via bottle. Mother has a DEBP set up. Mother reports unable to collect any colostrum when using pump. Infant has been been having good voids and stools, per mother. Mother states her feeding goal to offer only breast milk.   Encouraged mother to contact LC next feeding to evaluate latch and offer recommendations to attain her feeding goal. Mother agrees.   All questions answered at this time.  Maternal Data Has patient been taught Hand Expression?: Yes  Feeding Feeding Type: Bottle Fed - Formula Nipple Type: Slow - flow  Interventions Interventions: Breast feeding basics reviewed;Breast massage;Hand express;DEBP;Expressed milk  Consult Status Consult Status: Follow-up Date: 05/12/20 Follow-up type: In-patient    Raymondo Garcialopez A Higuera Ancidey 05/11/2020, 4:11 PM

## 2020-05-11 NOTE — Lactation Note (Signed)
This note was copied from a baby's chart. Lactation Consultation Note  Patient Name: Paula Gamble Date: 05/11/2020   Attempted LC visit to P2 of 29 hours old infant. Mother is unavailable at the time of visit.  LC will come back to room at another time as possible.     [name] 05/11/2020, 3:16 PM

## 2020-05-11 NOTE — Progress Notes (Signed)
CSW received consult due to score 13 on Edinburgh Depression Screen.    CSW used spanish interpretor [place] [phone] to speak with Paula Gamble. CSW congratulated Paula Gamble on the birth of infant. CSW advised Paula Gamble of CSW's role and the reason for CSW coming to speak with her. Paula Gamble expressed that "everything has been okay". CSW asked Paula Gamble about how she was feeling over the last 7 days that may have led her to score 13 on her [place]. Paula Gamble reported that she was feeling nervous about giving birth but again reported to CSW that everything has been okay. CSW asked Paula Gamble if this was her first child which may have caused nervousness however Paula Gamble reported that this is her second child. Paula Gamble expressed to CSW that she has no mental health hx and denies SI,HI and DV to this CSW. CSW still offered Paula Gamble therapy resources for the post partum period and Paula Gamble indicated that she doesn't have depression. CSW voiced understanding and advised Paula Gamble that CSW was aware and was only providing education to [facility]   CSW noted that while CSW spoke with Paula Gamble, FOB entered the room, CSW asked Paula Gamble for permission to have FOB wait outside the room in which Paula Gamble Was agreeable to . Paula Gamble expressed that her supports are her mom and FOB. Paula Gamble advised CSW that she has all needed items to care for infant but does report an interest in other referrals for supplies. Paula Gamble expressed that she has a carseat and basinet for infant to sleep in once arrived home. Paula Gamble reported that she doesn't have a crib though. CSW expressed to Paula Gamble that infant sleeping in basinet at home is appropriate however infant should never sleep in the bed with Paula Gamble or any other person. Paula Gamble expressed that she understood and expressed no other questions to CSW. Paula Gamble expressed that since she has given birth she has felt "Feliz (happy in spanish"  CSW provided education regarding Baby Blues vs PMADs and provided Paula Gamble with resources for mental health follow up.  CSW encouraged Paula Gamble to evaluate her mental health  throughout the postpartum period with the use of the New Mom Checklist developed by Postpartum Progress as well as the [place] Postnatal Depression Scale and notify a medical professional if symptoms arise.    Claude Manges Chen Saadeh, MSW, LCSW [facility] [phone]

## 2020-05-11 NOTE — Progress Notes (Signed)
POSTPARTUM PROGRESS NOTE  Subjective: Paula Gamble is a 24 y.o. [id number] s/p LTCS at [redacted]w[redacted]d.  She reports she doing well. No acute events overnight. She denies any problems with ambulating, voiding or po intake. Denies nausea or vomiting. She has passed flatus. Pain is moderately controlled.  Lochia is minimal.  Objective: Blood pressure (!) 109/50, pulse 62, temperature 98 F (36.7 C), temperature source Oral, resp. rate 16, height 5\' 4"  (1.626 m), weight 93.9 kg, last menstrual period 08/04/2019, SpO2 100 %, unknown if currently breastfeeding.  Physical Exam:  General: alert, cooperative and no distress Chest: no respiratory distress Abdomen: soft, appropriately tender, pressure dressing clean/dry/intact Uterine Fundus: firm and at level of umbilicus Extremities: No calf swelling or tenderness  no LE edema  Recent Labs    05/08/20 0947 05/11/20 0417  HGB 11.8* 8.5*  HCT 35.1* 26.7*    Assessment/Plan: Paula Gamble is a 24 y.o. 25 s/p LTCS at [redacted]w[redacted]d for elective repeat Cesarean.  Routine Postpartum Care: Doing well, pain well-controlled.  -- Continue routine care, lactation support  -- Contraception: s/p PP Liletta -- Feeding: breast and bottle  Dispo: Plan for discharge POD#2-3.  [redacted]w[redacted]d, MD OB Fellow, [facility] 05/11/2020 8:47 AM

## 2020-05-12 DIAGNOSIS — Z603 Acculturation difficulty: Secondary | ICD-10-CM

## 2020-05-12 DIAGNOSIS — Z98891 History of uterine scar from previous surgery: Secondary | ICD-10-CM

## 2020-05-12 DIAGNOSIS — Z975 Presence of (intrauterine) contraceptive device: Secondary | ICD-10-CM

## 2020-05-12 MED ORDER — MAGNESIUM HYDROXIDE 400 MG/5ML PO SUSP: 30.0000 mL | ORAL | 0 refills | Status: DC | PRN

## 2020-05-12 MED ORDER — SENNOSIDES-DOCUSATE SODIUM 8.6-50 MG PO TABS: 2.0000 | ORAL_TABLET | Freq: Every day | ORAL | 0 refills | Status: AC

## 2020-05-12 MED ORDER — OXYCODONE HCL 5 MG PO TABS: 5.0000 mg | ORAL_TABLET | ORAL | 0 refills | Status: AC | PRN

## 2020-05-12 MED ORDER — IBUPROFEN 800 MG PO TABS: 800.0000 mg | ORAL_TABLET | Freq: Four times a day (QID) | ORAL | 0 refills | Status: DC

## 2020-05-12 MED ORDER — ACETAMINOPHEN 500 MG PO TABS: 1000.0000 mg | ORAL_TABLET | Freq: Four times a day (QID) | ORAL | 0 refills | Status: DC

## 2020-05-12 MED ORDER — WITCH HAZEL-GLYCERIN EX PADS: 1.0000 "application " | MEDICATED_PAD | CUTANEOUS | 12 refills | Status: DC | PRN

## 2020-05-12 NOTE — Lactation Note (Signed)
This note was copied from Paula baby's chart. Lactation Consultation Note  Patient Name: Paula Gamble [id number] Date: 05/12/2020 Reason for consult: Follow-up assessment  Consult was done in Spanish:  Follow up visit to 49 hours old with 2.83% weight loss. Mother states she does not have any breast milk but she continues to stimulate with breast pump. Mother reports bottle-feeding ~30-59mL of formula per feeding. Infant has been been having good voids and stools, per mother.  Reinforced following the guidelines volumes for formula supplementation. Encouraged pace bottle feeding, upright position and frequent burping. Discussed milk coming into volume and using pump as well as ice to help.   Feeding plan:  1. Breastfeed following hunger cues.  2. Stimulate infant awake at the breast 3. Offer breast 8 -  12 times in 24h period to establish good milk supply.   4. If needed supplement with formula following guidelines, pace bottle feeding and fullness cues.   5. Encouraged maternal rest, hydration and food intake.  6. Contact Lactation Services or local resources for support, questions or concerns.    All questions answered at this time. Family is waiting to be discharged home today.   Maternal Data Formula Feeding for Exclusion: No  Interventions Interventions: Breast feeding basics reviewed;Hand pump;Ice;Expressed milk;Skin to skin  Consult Status Consult Status: Complete Date: 05/12/20 Follow-up type: Call as needed    Paula Gamble Paula Gamble 05/12/2020, 11:58 AM

## 2020-05-21 ENCOUNTER — Ambulatory Visit (INDEPENDENT_AMBULATORY_CARE_PROVIDER_SITE_OTHER): Payer: Self-pay

## 2020-05-21 ENCOUNTER — Other Ambulatory Visit: Payer: Self-pay

## 2020-05-21 VITALS — BP 103/48 | HR 63 | Wt 194.0 lb

## 2020-05-21 DIAGNOSIS — Z5189 Encounter for other specified aftercare: Secondary | ICD-10-CM

## 2020-05-21 MED ORDER — IBUPROFEN 800 MG PO TABS: 800.0000 mg | ORAL_TABLET | Freq: Four times a day (QID) | ORAL | 0 refills | Status: DC

## 2020-05-21 NOTE — Progress Notes (Signed)
Pt here today for incision check s/p rpt c-section on 05/10/20.  With Spanish Interpreter Eda R., pt reports mild pain and no bleeding. Incision well approximated- no odor, no drainage, no erythema, and no edema.  Pt advised to continue to allow warm, soapy water to run over incision and pat dry.  Pt also advised to continue to monitor for sx's of infection and to call the office with any concerns or questions.  Pt informed that she will be evaluated at her pp visit scheduled on 06/12/19.  Ibuprofen refilled per pt request.   Pt verbalized understanding with no further questions.   Addison Naegeli, RN  05/21/20

## 2020-05-27 ENCOUNTER — Encounter: Payer: Self-pay | Admitting: *Deleted

## 2020-06-05 ENCOUNTER — Encounter: Payer: Self-pay | Admitting: Clinical

## 2020-06-05 ENCOUNTER — Ambulatory Visit: Payer: Self-pay | Admitting: Clinical

## 2020-06-05 DIAGNOSIS — Z658 Other specified problems related to psychosocial circumstances: Secondary | ICD-10-CM

## 2020-06-05 NOTE — BH Specialist Note (Signed)
I reviewed patient visit with the [facility] Intern, and I concur with the treatment plan, as documented in the [facility] Intern note.   No charge for this visit due to [facility] Intern seeing patient.  Paula Gamble, MSW, LCSW Integrated Behavioral Health Clinician Center for [facility] Healthcare at [facility] for [facility] Health via Telemedicine Visit  04/22/2020 Paula Gamble [id number]  Number of Integrated Behavioral Health visits: 2 Session Start time: 10:48AM  Session End time: 11:33 Total time: 45   Referring Provider:Matthew Crissie Reese, MD Patient/Family location:Home [facility] Provider location:Center for [facility] at [facility] All persons participating in visit: Patient, BH Intern, Interpreter: Paula Gamble [phone]) Types of Service: Individual psychotherapy  I connected with Paula Gamble by Telephone and verified that I am speaking with the correct person using two identifiers.    Discussed confidentiality: Yes   I discussed the limitations of telemedicine and the availability of in person appointments.  Discussed there is a possibility of technology failure and discussed alternative modes of communication if that failure occurs.  I discussed that engaging in this telemedicine visit, they consent to the provision of behavioral healthcare and the services will be billed under their insurance.  Patient and/or legal guardian expressed understanding and consented to Telemedicine visit: Yes   Presenting Concerns: Patient and/or family reports the following symptoms/concerns: she is experiencing no appetite almost everyday since giving birth, felt worried last week because she did not have pampers. Duration of problem: 05/10/2021; Severity of problem: mild  Patient and/or Family's Strengths/Protective Factors: Social connections, Social and Emotional competence and Concrete supports in place (healthy food, safe  environments, etc.)  Goals Addressed: Patient will: 1.  Reduce symptoms of: stress  2.  Increase knowledge and/or ability of: healthy habits and self-management skills  3.  Demonstrate ability to: Increase healthy adjustment to current life circumstances and Increase adequate support systems for patient/family  Progress towards Goals: Ongoing  Interventions: Interventions utilized:  Link to [employer] Standardized Assessments completed: GAD-7 and PHQ 9  Patient and/or Family Response: Patient expressed understanding and plans to adhere to treatment plan.  Assessment: Patient currently experiencing Psychosocial stress.   Patient may benefit from link to community resources.  Plan: 1. Follow up with behavioral health clinician on : 06/19/2020 at 10:45AM 2. Behavioral recommendations: follow up with appt on 06/11/2020 about lack of appetite, contact [facility] in [facility] regarding diaper bank referral 3. Referral(s): Integrated [employer] (In Clinic)  I discussed the assessment and treatment plan with the patient and/or parent/guardian. They were provided an opportunity to ask questions and all were answered. They agreed with the plan and demonstrated an understanding of the instructions.  They were advised to call back or seek an in-person evaluation if the symptoms worsen or if the condition fails to improve as anticipated.  Paula Gamble (Supervisor: Paula Gamble) Depression screen [facility] 2/9 06/05/2020 05/09/2020 04/23/2020  Decreased Interest 0 2 3  Down, Depressed, Hopeless 0 1 3  PHQ - 2 Score 0 3 6  Altered sleeping 0 1 3  Tired, decreased energy 1 2 3   Change in appetite 3 1 2   Feeling bad or failure about yourself  0 0 0  Trouble concentrating 0 0 1  Moving slowly or fidgety/restless 0 1 1  Suicidal thoughts 0 0 0  PHQ-9 Score 4 8 16   Some recent data might be hidden   GAD 7 : Generalized Anxiety Score 06/05/2020 05/09/2020 04/23/2020  04/09/2020  Nervous, Anxious, on Edge 1 2 2 2   Control/stop worrying 0 0 2 1  Worry too much - different things 1 1 1 1   Trouble relaxing 1 1 1 1   Restless 0 1 0 0  Easily annoyed or irritable 0 2 3 3   Afraid - awful might happen 0 0 2 0  Total GAD 7 Score 3 7 11  8

## 2020-06-05 NOTE — BH Specialist Note (Signed)
I reviewed patient visit with the [facility] Intern, and I concur with the treatment plan, as documented in the [facility] Intern note.   No charge for this visit due to [facility] Intern seeing patient.  Hulda Marin, MSW, LCSW Integrated Behavioral Health Clinician Center for [facility] Healthcare at [facility] for [facility] Health via Telemedicine Visit  04/22/2020 Shelanda Duvall [id number]  Number of Integrated Behavioral Health visits: 2 Session Start time: 10:48AM  Session End time: 11:33 Total time: 45   Referring Provider:Matthew Crissie Reese, MD Patient/Family location: Home [facility] Provider location: Center for [facility] Healthcare at [facility] for Women All persons participating in visit: Patient, BH Intern, Interpreter: [place] Noe [phone]) Types of Service: Individual psychotherapy  I connected with Darryl Lent by Telephone and verified that I am speaking with the correct person using two identifiers.    Discussed confidentiality: Yes   I discussed the limitations of telemedicine and the availability of in person appointments.  Discussed there is a possibility of technology failure and discussed alternative modes of communication if that failure occurs.  I discussed that engaging in this telemedicine visit, they consent to the provision of behavioral healthcare and the services will be billed under their insurance.  Patient and/or legal guardian expressed understanding and consented to Telemedicine visit: Yes   Presenting Concerns: Patient and/or family reports the following symptoms/concerns: she is experiencing no appetite almost everyday since giving birth, felt worried last week because she did not have pampers. Duration of problem: 05/10/2021; Severity of problem: mild  Patient and/or Family's Strengths/Protective Factors: Social connections, Social and Emotional competence and Concrete supports in place (healthy food, safe environments,  etc.)  Goals Addressed: Patient will: 1.  Reduce symptoms of: stress  2.  Increase knowledge and/or ability of: healthy habits and self-management skills  3.  Demonstrate ability to: Increase healthy adjustment to current life circumstances and Increase adequate support systems for patient/family  Progress towards Goals: Ongoing  Interventions: Interventions utilized:  Link to [employer] Standardized Assessments completed: GAD-7 and PHQ 9  Patient and/or Family Response: Patient expressed understanding and plans to adhere to treatment plan.  Assessment: Patient currently experiencing Psychosocial stress.   Patient may benefit from link to community resources.  Plan: 1. Follow up with behavioral health clinician on : 06/19/2020 at 10:45AM 2. Behavioral recommendations: follow up with appt on 06/11/2020 about lack of appetite, contact [facility] in [facility] regarding diaper bank referral 3. Referral(s): Integrated [employer] (In Clinic)  I discussed the assessment and treatment plan with the patient and/or parent/guardian. They were provided an opportunity to ask questions and all were answered. They agreed with the plan and demonstrated an understanding of the instructions.   They were advised to call back or seek an in-person evaluation if the symptoms worsen or if the condition fails to improve as anticipated.  Bea Graff (Supervisor Jamie McMannes) Depression screen [facility] 2/9 06/05/2020 05/09/2020 04/23/2020 04/09/2020 03/06/2020  Decreased Interest 0 2 3 1 1   Down, Depressed, Hopeless 0 1 3 1  0  PHQ - 2 Score 0 3 6 2 1   Altered sleeping 0 1 3 1 1   Tired, decreased energy 1 2 3 1 1   Change in appetite 3 1 2  0 1  Feeling bad or failure about yourself  0 0 0 0 0  Trouble concentrating 0 0 1 0 0  Moving slowly or fidgety/restless 0 1 1 2 1   Suicidal thoughts 0 0 0 0 0  PHQ-9  Score 4 8 16 6 5   Some recent data might be hidden   GAD 7 : Generalized Anxiety  Score 06/05/2020 05/09/2020 04/23/2020 04/09/2020  Nervous, Anxious, on Edge 1 2 2 2   Control/stop worrying 0 0 2 1  Worry too much - different things 1 1 1 1   Trouble relaxing 1 1 1 1   Restless 0 1 0 0  Easily annoyed or irritable 0 2 3 3   Afraid - awful might happen 0 0 2 0  Total GAD 7 Score 3 7 11  8

## 2020-06-11 ENCOUNTER — Other Ambulatory Visit: Payer: Self-pay

## 2020-06-11 ENCOUNTER — Encounter: Payer: Self-pay | Admitting: Obstetrics & Gynecology

## 2020-06-11 ENCOUNTER — Ambulatory Visit (INDEPENDENT_AMBULATORY_CARE_PROVIDER_SITE_OTHER): Payer: Self-pay | Admitting: Obstetrics & Gynecology

## 2020-06-11 DIAGNOSIS — K8020 Calculus of gallbladder without cholecystitis without obstruction: Secondary | ICD-10-CM

## 2020-06-11 DIAGNOSIS — Z975 Presence of (intrauterine) contraceptive device: Secondary | ICD-10-CM

## 2020-06-11 DIAGNOSIS — O26619 Liver and biliary tract disorders in pregnancy, unspecified trimester: Secondary | ICD-10-CM

## 2020-06-11 NOTE — Progress Notes (Signed)
Post Partum Visit Note  Paula Gamble is a 25 y.o. [phone] female who presents for a postpartum visit. Patient is Spanish-speaking only, interpreter present for this encounter. She is 3 weeks postpartum following a repeat cesarean section.  I have fully reviewed the prenatal and intrapartum course. The delivery was at 39 gestational weeks.  Anesthesia: spinal. Postpartum course has been uncomplicated. Baby is doing well. Baby is feeding by bottle - [employer] Good Start. Bleeding thin lochia. Bowel function is normal. Bladder function is normal. Patient is sexually active. Contraception method is Liletta IUD, placed during cesarean section. Postpartum depression screening: negative.   Edinburgh Postnatal Depression Scale - 06/11/20 1102      Edinburgh Postnatal Depression Scale:  In the Past 7 Days   I have been able to laugh and see the funny side of things. 0    I have looked forward with enjoyment to things. 0    I have blamed myself unnecessarily when things went wrong. 0    I have been anxious or worried for no good reason. 0    I have felt scared or panicky for no good reason. 0    Things have been getting on top of me. 0    I have been so unhappy that I have had difficulty sleeping. 0    I have felt sad or miserable. 0    I have been so unhappy that I have been crying. 0    The thought of harming myself has occurred to me. 0    Edinburgh Postnatal Depression Scale Total 0          The following portions of the patient's history were reviewed and updated as appropriate: allergies, current medications, past family history, past medical history, past social history, past surgical history and problem list.  Review of Systems Pertinent items noted in HPI and remainder of comprehensive ROS otherwise negative.    Objective:  BP (!) 115/58   Pulse 79   Ht 5\' 4"  (1.626 m)   Wt 197 lb 9.6 oz (89.6 kg)   BMI 33.92 kg/m    General:  alert and no distress  Lungs: clear to  auscultation bilaterally  Heart:  regular rate and rhythm  Abdomen: soft, non-tender; bowel sounds normal; no masses,  no organomegaly and incision is well-healed, C/D/I   Vulva:  normal  Vagina: normal vagina, no discharge, exudate, lesion, or erythema  Cervix:  no lesions and normal appearance, Liletta IUD strings seen, almost flush with external os.        Assessment:   Normal postpartum exam.   Plan:   Essential components of care per ACOG recommendations:  1.  Mood and well being: Patient with negative depression screening today. Reviewed local resources for support.  - Patient does not use tobacco.  - Reports seldom THC use, not recently. Declined support.  2. Infant care and feeding:  -Patient currently breastmilk feeding? No  -Social determinants of health (SDOH) reviewed in EPIC. No concerns.  3. Sexuality, contraception and birth spacing - Patient does not want a pregnancy in the next year.  Desired family size is 2 children.  -  Normal IUD check today, can keep Liletta in place for up to seven years.    4. Sleep and fatigue -Encouraged family/partner/community support of 4 hrs of uninterrupted sleep to help with mood and fatigue  5. Physical Recovery  - Discussed patients delivery - No complications reported - Patient has urinary incontinence? No  -  Patient is safe to resume physical and sexual activity  6. Gallstones during pregnancy - Asymptomatic, will follow up with General Surgery if symptoms recur.  7.  Health Maintenance - Last pap smear done 11/01/2019 and was normal     Jaynie Collins, MD Center for [employer], [facility]

## 2020-06-19 ENCOUNTER — Ambulatory Visit: Payer: Self-pay | Admitting: Clinical

## 2020-06-19 DIAGNOSIS — Z658 Other specified problems related to psychosocial circumstances: Secondary | ICD-10-CM

## 2020-06-19 NOTE — BH Specialist Note (Signed)
I reviewed patient visit with the [facility] Intern, and I concur with the treatment plan, as documented in the [facility] Intern note.   No charge for this visit due to [facility] Intern seeing patient.  Hulda Marin, MSW, LCSW Integrated Behavioral Health Clinician Center for [facility] at [facility] for [facility] via Telemedicine Visit  04/22/2020 Zanasia Hickson [id number]  Number of Integrated Behavioral Health visits: 3 Session Start time: 10:31 Session End time: 10:55 Total time: 24  Referring Provider:Matthew Crissie Reese, MD Patient/Family location:Home [facility] Provider location:[facility] Women All persons participating in visit: Patient, BH Intern, Interpreter: Albin Felling [phone]) Types of Service: Individual psychotherapy  I connected with Darryl Lent and/or Darryl Lent by Telephone and verified that I am speaking with the correct person using two identifiers.    Discussed confidentiality: Yes   I discussed the limitations of telemedicine and the availability of in person appointments.  Discussed there is a possibility of technology failure and discussed alternative modes of communication if that failure occurs.  I discussed that engaging in this telemedicine visit, they consent to the provision of behavioral healthcare and the services will be billed under their insurance.  Patient and/or legal guardian expressed understanding and consented to Telemedicine visit: Yes   Presenting Concerns: Patient and/or family reports the following symptoms/concerns: Pts primary concern is with incision site pain. Pt's doctor was contacted regarding pain. Pt was encouraged to go to urgent care or emergency room if she needs to. Pt stated using WIC has been helpful in providing groceries. Duration of problem: 05/10/2020; Severity of problem: mild  Patient and/or Family's Strengths/Protective  Factors: Social and Emotional competence and Concrete supports in place (healthy food, safe environments, etc.)  Goals Addressed: Patient will: 1.  Reduce symptoms of: stress  2.  Increase knowledge and/or ability of: self-management skills and stress reduction  3.  Demonstrate ability to: Increase healthy adjustment to current life circumstances and Increase adequate support systems for patient/family  Progress towards Goals: Achieved  Interventions: Interventions utilized:  Supportive Counseling Standardized Assessments completed: Not Needed due to having negative screening on 06/11/2020  Patient and/or Family Response: Patient voiced understanding.  Assessment: Patient currently experiencing Psychosocial stress.   Patient may benefit from linking to community resources.  Plan: 1. Follow up with behavioral health clinician on : as needed in the future 2. Behavioral recommendations: Follow up with doctor regarding pain, follow up with diaper bank, continue using [facility] resources. 3. Referral(s): Integrated [employer] (In Clinic)  I discussed the assessment and treatment plan with the patient and/or parent/guardian. They were provided an opportunity to ask questions and all were answered. They agreed with the plan and demonstrated an understanding of the instructions.   They were advised to call back or seek an in-person evaluation if the symptoms worsen or if the condition fails to improve as anticipated.  Bea Graff (Supervisor: Hulda Marin)

## 2020-10-30 ENCOUNTER — Ambulatory Visit
Admission: RE | Admit: 2020-10-30 | Discharge: 2020-10-30 | Disposition: A | Discharge status: Home / Self Care | Payer: No Typology Code available for payment source | Source: Ambulatory Visit | Attending: Nurse Practitioner | Admitting: Nurse Practitioner

## 2020-10-30 ENCOUNTER — Other Ambulatory Visit: Payer: Self-pay | Admitting: Nurse Practitioner

## 2020-10-30 DIAGNOSIS — R102 Pelvic and perineal pain: Secondary | ICD-10-CM

## 2020-11-10 ENCOUNTER — Ambulatory Visit: Payer: Self-pay | Admitting: Obstetrics and Gynecology

## 2020-11-10 DIAGNOSIS — Z0289 Encounter for other administrative examinations: Secondary | ICD-10-CM

## 2020-11-25 ENCOUNTER — Encounter: Payer: Self-pay | Admitting: Obstetrics and Gynecology

## 2020-11-25 ENCOUNTER — Other Ambulatory Visit: Payer: Self-pay

## 2020-11-25 ENCOUNTER — Ambulatory Visit (INDEPENDENT_AMBULATORY_CARE_PROVIDER_SITE_OTHER): Payer: Self-pay | Admitting: Obstetrics and Gynecology

## 2020-11-25 VITALS — BP 116/64 | Ht 64.0 in | Wt 190.0 lb

## 2020-11-25 DIAGNOSIS — Z3009 Encounter for other general counseling and advice on contraception: Secondary | ICD-10-CM

## 2020-11-25 DIAGNOSIS — Z30432 Encounter for removal of intrauterine contraceptive device: Secondary | ICD-10-CM

## 2020-11-25 NOTE — Progress Notes (Signed)
25 y.o. [id number] Married Hispanic female here for IUD removal and contraceptive counseling.  Spanish interpretor present for the visit today.   IUD placed with her C/S.  Not breast feeding.   She has pain with intercourse.  Also having spontaneous pain.  No menses.   She had an x-Ray to detect the IUD.   No pelvic ultrasound was done.   Wants to remove the IUD and have a Nexplanon placed.  Declines pregnancy at this time.   PCP:   Leroy Libman, MD  No LMP recorded. (Menstrual status: IUD).           Sexually active: Yes.    The current method of family planning is IUD, Lelitta placed 05/10/20 during cesarean Section.  Exercising: No.   Smoker:  no  Health Maintenance: Pap:  11/01/19 - normal.  History of abnormal Pap:  no MMG:  None  Colonoscopy:  none BMD:   None  TDaP:  02/21/2020 Gardasil:   no HIV: 02/21/20 - Negative Hep C: n/a Screening Labs:  Hb today: None , Urine today: None   reports that she has never smoked. She has never used smokeless tobacco. She reports current alcohol use. She reports current drug use. Frequency: 7.00 times per week. Drug: Marijuana.  Past Medical History:  Diagnosis Date   Abnormal Pap smear of cervix 01/06/2017   LSIL - Needs repeat 04/2017   COVID-19 04/2019   History of COVID-19 10/31/2019   04/2019   Pyelonephritis     Past Surgical History:  Procedure Laterality Date   ABDOMINOPLASTY     CESAREAN SECTION N/A 11/25/2016   Procedure: CESAREAN SECTION;  Surgeon: Lazaro Arms, MD;  Location: [facility]ING SU[facility];  Service: Obstetrics;  Laterality: N/A;   CESAREAN SECTION N/A 05/10/2020   Procedure: CESAREAN SECTION;  Surgeon: Tereso Newcomer, MD;  Location: [facility] LD ORS;  Service: Obstetrics;  Laterality: N/A;   DILATION AND CURETTAGE OF UTERUS     x2   INTRAUTERINE DEVICE (IUD) INSERTION  05/10/2020   Procedure: INTRAUTERINE DEVICE (IUD) INSERTION;  Surgeon: Tereso Newcomer, MD;  Location: [facility] LD ORS;  Service: Obstetrics;;     Current Outpatient Medications  Medication Sig Dispense Refill   acetaminophen (TYLENOL) 500 MG tablet Take 2 tablets (1,000 mg total) by mouth every 6 (six) hours. (Patient not taking: Reported on 05/21/2020) 30 tablet 0   ibuprofen (ADVIL) 800 MG tablet Take 1 tablet (800 mg total) by mouth every 6 (six) hours. 30 tablet 0   magnesium hydroxide (MILK OF MAGNESIA) 400 MG/5ML suspension Take 30 mLs by mouth every three (3) days as needed for mild constipation (if no BM). (Patient not taking: Reported on 05/21/2020) 355 mL 0   Prenatal Vit-Fe Fumarate-FA (MULTIVITAMIN-PRENATAL) 27-0.8 MG TABS tablet Take 1 tablet by mouth at bedtime.     witch hazel-glycerin (TUCKS) pad Apply 1 application topically as needed for hemorrhoids. 40 each 12   No current facility-administered medications for this visit.    Family History  Problem Relation Age of Onset   Diabetes Paternal Uncle    Cancer Maternal Grandmother    Diabetes Maternal Grandmother    Hypertension Maternal Grandmother    Cancer Paternal Grandmother        liver    Review of Systems  See HPI.  Exam:   BP 116/64 (BP Location: Left Arm, Cuff Size: Normal)   Ht 5\' 4"  (1.626 m)   Wt 190 lb (86.2 kg)   Breastfeeding No  BMI 32.61 kg/m     General appearance: alert, cooperative and appears stated age   Pelvic: External genitalia:  no lesions              No abnormal inguinal nodes palpated.              Urethra:  normal appearing urethra with no masses, tenderness or lesions              Bartholins and Skenes: normal                 Vagina: normal appearing vagina with normal color and discharge, no lesions              Cervix: no lesions.  NO IUD strings noted.            Bimanual Exam:  Uterus:  normal size, contour, position, consistency, mobility, non-tender              Adnexa: no mass, fullness, tenderness     IUD removal.  Verbal consent for removal.  Hibiclens prep.  Dressing forceps used to successfully grasp  strings and remove IUD intact, shown to patient, and discarded.   Chaperone was present for exam:  Sherrilyn Rist, CMA.  Assessment:     Encounter for IUD removal.  Contraceptive counseling.  Latex allergy.   Plan:   IUD successfully removed. Patient instructed to use latex free condoms for pregnancy protection immediately.  We discussed Nexplanon, risks and benefits, and placement procedure.  She may return here for this appointment or go to the [facility]  FU prn.

## 2020-12-10 ENCOUNTER — Inpatient Hospital Stay (HOSPITAL_COMMUNITY)
Admission: AD | Admit: 2020-12-10 | Discharge: 2020-12-10 | Disposition: A | Discharge status: Home / Self Care | Payer: Medicaid Other | Attending: Obstetrics & Gynecology | Admitting: Obstetrics & Gynecology

## 2020-12-10 ENCOUNTER — Other Ambulatory Visit: Payer: Self-pay

## 2020-12-10 DIAGNOSIS — Z3200 Encounter for pregnancy test, result unknown: Secondary | ICD-10-CM

## 2020-12-10 DIAGNOSIS — Z3202 Encounter for pregnancy test, result negative: Secondary | ICD-10-CM | POA: Diagnosis not present

## 2020-12-10 DIAGNOSIS — R109 Unspecified abdominal pain: Secondary | ICD-10-CM | POA: Insufficient documentation

## 2020-12-10 DIAGNOSIS — N939 Abnormal uterine and vaginal bleeding, unspecified: Secondary | ICD-10-CM | POA: Diagnosis not present

## 2020-12-10 DIAGNOSIS — N912 Amenorrhea, unspecified: Secondary | ICD-10-CM

## 2020-12-10 LAB — POCT PREGNANCY, URINE: Preg Test, Ur: NEGATIVE

## 2020-12-10 LAB — HCG, QUANTITATIVE, PREGNANCY: hCG, Beta Chain, Quant, S: <1 m[IU]/mL (ref <5)

## 2020-12-10 NOTE — MAU Note (Signed)
Presents with c/o nausea, no vomiting.  Also reports dizziness and brown vaginal discharge.  States had +HPT yesterday.  Reports had IUD removed 10/30/2020 and began VB 10 minutes after removed.

## 2020-12-10 NOTE — MAU Provider Note (Signed)
  S:   25 y.o. [id number] @Unknown  by LMP presents to MAU for pregnancy confirmation.  She denies abdominal pain or vaginal bleeding today.  She reports + pregnancy test at home yesterday.  Had IUD removed 11/25/2020  O: BP 126/73 (BP Location: Right Arm)   Pulse 63   Temp 98.3 F (36.8 C) (Oral)   Resp 20   Wt 84.6 kg   SpO2 99%   BMI 32.01 kg/m  Physical Examination: General appearance - alert, well appearing, and in no distress, oriented to person, place, and time and acyanotic, in no respiratory distress  Results for orders placed or performed during the hospital encounter of 12/10/20 (from the past 48 hour(s))  Pregnancy, urine POC     Status: None   Collection Time: 12/10/20 12:51 PM  Result Value Ref Range   Preg Test, Ur NEGATIVE NEGATIVE    Comment:        THE SENSITIVITY OF THIS METHODOLOGY IS >24 mIU/mL   hCG, quantitative, pregnancy     Status: None   Collection Time: 12/10/20  2:01 PM  Result Value Ref Range   hCG, Beta Chain, Quant, S <1 <5 mIU/mL    Comment:          GEST. AGE      CONC.  (mIU/mL)   <=1 WEEK        5 - 50     2 WEEKS       50 - 500     3 WEEKS       100 - 10,000     4 WEEKS     1,000 - 30,000     5 WEEKS     3,500 - 115,000   6-8 WEEKS     12,000 - 270,000    12 WEEKS     15,000 - 220,000        FEMALE AND NON-PREGNANT FEMALE:     LESS THAN 5 mIU/mL Performed at [facility], [address], [place], [place]     A: Missed menses Quant is negative.   P: D/C home Patient notified of results.  Return to MAU as needed for pregnancy related emergencies  [id number], NP 1:54 PM

## 2021-01-05 ENCOUNTER — Inpatient Hospital Stay (HOSPITAL_COMMUNITY)
Admission: AD | Admit: 2021-01-05 | Discharge: 2021-01-05 | Disposition: A | Discharge status: Home / Self Care | Payer: Medicaid Other | Attending: Family Medicine | Admitting: Family Medicine

## 2021-01-05 DIAGNOSIS — Z789 Other specified health status: Secondary | ICD-10-CM | POA: Diagnosis present

## 2021-01-05 DIAGNOSIS — Z603 Acculturation difficulty: Secondary | ICD-10-CM

## 2021-01-05 DIAGNOSIS — M549 Dorsalgia, unspecified: Secondary | ICD-10-CM | POA: Diagnosis present

## 2021-01-05 LAB — POCT PREGNANCY, URINE: Preg Test, Ur: NEGATIVE

## 2021-01-05 NOTE — MAU Note (Signed)
Paula Gamble is a 25 y.o. here in MAU reporting: back pain, urine in blood, and milk coming out of her breasts since Saturday.   LMP: 12/30/20  Onset of complaint: ongoing  Pain score: 10/10  Vitals:   01/05/21 1749  BP: 123/68  Pulse: 98  Resp: 20  Temp: (!) 101.3 F (38.5 C)  SpO2: 97%     Lab orders placed from triage: upt

## 2021-01-05 NOTE — MAU Provider Note (Signed)
Event Date/Time   First Provider Initiated Contact with Patient 01/05/21 1759      S [name] is a 25 y.o. [id number] patient who presents to MAU today with complaint of back pain. She states the back pain started Saturday.  She reports she has tried tylenol and ibuprofen.  She states the ibuprofen helped because it was 800mg .  She states the pain is currently 7-8/10.    O BP 123/68 (BP Location: Left Arm)   Pulse 98   Temp (!) 101.3 F (38.5 C) (Oral)   Resp 20   LMP 12/30/2020   SpO2 97% Comment: room air Physical Exam Vitals reviewed.  Constitutional:      Appearance: Normal appearance.  HENT:     Head: Normocephalic and atraumatic.  Cardiovascular:     Rate and Rhythm: Normal rate.  Pulmonary:     Effort: Pulmonary effort is normal. No respiratory distress.  Neurological:     Mental Status: She is alert and oriented to person, place, and time.  Psychiatric:        Mood and Affect: Mood normal.        Behavior: Behavior normal.    A Medical screening exam complete Back Pain Non Pregnant Female Language Barrier  P Discharge from MAU in stable condition Patient given the option of transfer to [facility] for further evaluation or seek care in outpatient facility of choice. Patient opts to go to Urgent care in hopes of being seen faster. Given information for [facility]. Interpretations completed with assistance of in-person interpreter [facility].  [facility], CNM 01/05/2021 6:00 PM

## 2021-01-06 ENCOUNTER — Other Ambulatory Visit: Payer: Self-pay

## 2021-01-06 ENCOUNTER — Emergency Department (HOSPITAL_COMMUNITY)
Admission: EM | Admit: 2021-01-06 | Discharge: 2021-01-06 | Discharge status: Against Medical Advice | Payer: Medicaid Other

## 2021-01-06 NOTE — ED Notes (Signed)
Patient did not want wait for triage or to be seen by a doctor left without seen

## 2021-01-07 ENCOUNTER — Emergency Department (HOSPITAL_BASED_OUTPATIENT_CLINIC_OR_DEPARTMENT_OTHER)
Admission: EM | Admit: 2021-01-07 | Discharge: 2021-01-07 | Disposition: A | Discharge status: Home / Self Care | Payer: Medicaid Other | Attending: Emergency Medicine | Admitting: Emergency Medicine

## 2021-01-07 ENCOUNTER — Other Ambulatory Visit: Payer: Self-pay

## 2021-01-07 ENCOUNTER — Encounter (HOSPITAL_BASED_OUTPATIENT_CLINIC_OR_DEPARTMENT_OTHER): Payer: Self-pay | Admitting: Emergency Medicine

## 2021-01-07 DIAGNOSIS — N12 Tubulo-interstitial nephritis, not specified as acute or chronic: Secondary | ICD-10-CM | POA: Insufficient documentation

## 2021-01-07 DIAGNOSIS — R000 Tachycardia, unspecified: Secondary | ICD-10-CM | POA: Insufficient documentation

## 2021-01-07 DIAGNOSIS — Z91040 Latex allergy status: Secondary | ICD-10-CM | POA: Diagnosis not present

## 2021-01-07 DIAGNOSIS — D72829 Elevated white blood cell count, unspecified: Secondary | ICD-10-CM | POA: Diagnosis not present

## 2021-01-07 DIAGNOSIS — R109 Unspecified abdominal pain: Secondary | ICD-10-CM | POA: Diagnosis present

## 2021-01-07 DIAGNOSIS — Z8616 Personal history of COVID-19: Secondary | ICD-10-CM | POA: Insufficient documentation

## 2021-01-07 LAB — CBC WITH DIFFERENTIAL/PLATELET
Abs Immature Granulocytes: 0.09 10*3/uL — ABNORMAL HIGH (ref 0.00–0.07)
Basophils Absolute: 0 10*3/uL (ref 0.0–0.1)
Basophils Relative: 0 %
Eosinophils Absolute: 0 10*3/uL (ref 0.0–0.5)
Eosinophils Relative: 0 %
HCT: 40.3 % (ref 36.0–46.0)
Hemoglobin: 13.4 g/dL (ref 12.0–15.0)
Immature Granulocytes: 1 %
Lymphocytes Relative: 8 %
Lymphs Abs: 1.2 10*3/uL (ref 0.7–4.0)
MCH: 31.3 pg (ref 26.0–34.0)
MCHC: 33.3 g/dL (ref 30.0–36.0)
MCV: 94.2 fL (ref 80.0–100.0)
Monocytes Absolute: 1.3 10*3/uL — ABNORMAL HIGH (ref 0.1–1.0)
Monocytes Relative: 9 %
Neutro Abs: 12.1 10*3/uL — ABNORMAL HIGH (ref 1.7–7.7)
Neutrophils Relative %: 82 %
Platelets: 198 10*3/uL (ref 150–400)
RBC: 4.28 MIL/uL (ref 3.87–5.11)
RDW: 12.7 % (ref 11.5–15.5)
WBC: 14.6 10*3/uL — ABNORMAL HIGH (ref 4.0–10.5)
nRBC: 0 % (ref 0.0–0.2)

## 2021-01-07 LAB — URINALYSIS, ROUTINE W REFLEX MICROSCOPIC
Bilirubin Urine: NEGATIVE
Glucose, UA: NEGATIVE mg/dL
Ketones, ur: NEGATIVE mg/dL
Nitrite: NEGATIVE
Protein, ur: NEGATIVE mg/dL
Specific Gravity, Urine: <1.005 — ABNORMAL LOW (ref 1.005–1.030)
pH: 6 (ref 5.0–8.0)

## 2021-01-07 LAB — BASIC METABOLIC PANEL
Anion gap: 9 (ref 5–15)
BUN: 6 mg/dL (ref 6–20)
CO2: 25 mmol/L (ref 22–32)
Calcium: 9 mg/dL (ref 8.9–10.3)
Chloride: 103 mmol/L (ref 98–111)
Creatinine, Ser: 0.72 mg/dL (ref 0.44–1.00)
GFR, Estimated: >60 mL/min (ref >60)
Glucose, Bld: 95 mg/dL (ref 70–99)
Potassium: 3.5 mmol/L (ref 3.5–5.1)
Sodium: 137 mmol/L (ref 135–145)

## 2021-01-07 LAB — URINALYSIS, MICROSCOPIC (REFLEX): WBC, UA: >50 WBC/hpf (ref 0–5)

## 2021-01-07 LAB — LACTIC ACID, PLASMA: Lactic Acid, Venous: 1 mmol/L (ref 0.5–1.9)

## 2021-01-07 LAB — PREGNANCY, URINE: Preg Test, Ur: NEGATIVE

## 2021-01-07 MED ORDER — ONDANSETRON HCL 4 MG/2ML IJ SOLN
4.0000 mg | Freq: Once | INTRAMUSCULAR | Status: AC
  Administered 2021-01-07: 4 mg via INTRAVENOUS
  Filled 2021-01-07: qty 2

## 2021-01-07 MED ORDER — ONDANSETRON 4 MG PO TBDP: 4.0000 mg | ORAL_TABLET | Freq: Three times a day (TID) | ORAL | 0 refills | Status: DC | PRN

## 2021-01-07 MED ORDER — SODIUM CHLORIDE 0.9 % IV BOLUS
1000.0000 mL | Freq: Once | INTRAVENOUS | Status: AC
  Administered 2021-01-07: 1000 mL via INTRAVENOUS

## 2021-01-07 MED ORDER — KETOROLAC TROMETHAMINE 30 MG/ML IJ SOLN
30.0000 mg | Freq: Once | INTRAMUSCULAR | Status: AC
  Administered 2021-01-07: 30 mg via INTRAVENOUS
  Filled 2021-01-07: qty 1

## 2021-01-07 MED ORDER — ACETAMINOPHEN 500 MG PO TABS
1000.0000 mg | ORAL_TABLET | Freq: Once | ORAL | Status: AC
  Administered 2021-01-07: 1000 mg via ORAL
  Filled 2021-01-07: qty 2

## 2021-01-07 MED ORDER — SODIUM CHLORIDE 0.9 % IV SOLN: INTRAVENOUS | Status: DC | PRN

## 2021-01-07 MED ORDER — SODIUM CHLORIDE 0.9 % IV SOLN
2.0000 g | Freq: Once | INTRAVENOUS | Status: AC
  Administered 2021-01-07: 2 g via INTRAVENOUS
  Filled 2021-01-07: qty 20

## 2021-01-07 MED ORDER — CEPHALEXIN 500 MG PO CAPS: 500.0000 mg | ORAL_CAPSULE | Freq: Four times a day (QID) | ORAL | 0 refills | Status: AC

## 2021-01-07 NOTE — ED Notes (Signed)
All communication done with interpreter.

## 2021-01-07 NOTE — ED Triage Notes (Signed)
Pt reports bilateral flank pain and hematuria since Saturday. Interpreter used for triage.

## 2021-01-07 NOTE — Discharge Instructions (Addendum)
Please pick up antibiotics and take as prescribed. I have also prescribed nausea medication to take as needed.   Follow up with your PCP in 1-2 weeks for recheck of your urine to ensure resolution of infection.   You can take Ibuprofen and Tylenol as needed for pain and fevers > 100.4  Return to the ED IMMEDIATELY For any new/worsening symptoms including worsening pain, persistent fevers despite being on antibiotics for > 48 hours, excessive vomiting, or any other concerning symptoms

## 2021-01-07 NOTE — ED Provider Notes (Signed)
[facility] EMERGENCY DEPARTMENT Provider Note   CSN: [id number] Arrival date & time: 01/07/21  4431     History Chief Complaint  Patient presents with   Flank Pain    Paula Gamble is a 25 y.o. female who presents to the ED today with complaint of gradual onset, constant, sharp, bilateral flank pain that began 5 days ago. Pt also complains of abdominal pain, nausea, vomiting, urinary frequency, dysuria, and hematuria. She reports hx of similar symptoms 5 years ago when she had pyelonephritis. She went to the MAU yesterday however given she had a negative pregnancy test she was advised to go to the ED for further eval. Pt opted to go to UC however left prior to being seen due to wait times. She was unaware she had a fever until today - febrile at 102.4 today and provided Tylenol in the ED. No other complaints at this time.   The history is provided by the patient and medical records. The history is limited by a language barrier. A language interpreter was used.      Past Medical History:  Diagnosis Date   Abnormal Pap smear of cervix 01/06/2017   LSIL - Needs repeat 04/2017   COVID-19 04/2019   History of COVID-19 10/31/2019   04/2019   Pyelonephritis     Patient Active Problem List   Diagnosis Date Noted   Liletta IUD (intrauterine device) placed 05/10/2020 05/10/2020   Gallstones 03/06/2020   Language barrier 10/31/2019   Obesity during pregnancy 10/31/2019    Past Surgical History:  Procedure Laterality Date   ABDOMINOPLASTY     CESAREAN SECTION N/A 11/25/2016   Procedure: CESAREAN SECTION;  Surgeon: Lazaro Arms, MD;  Location: [facility] BIRTHING SUITES;  Service: Obstetrics;  Laterality: N/A;   CESAREAN SECTION N/A 05/10/2020   Procedure: CESAREAN SECTION;  Surgeon: Tereso Newcomer, MD;  Location: [facility] LD ORS;  Service: Obstetrics;  Laterality: N/A;   DILATION AND CURETTAGE OF UTERUS     x2   INTRAUTERINE DEVICE (IUD) INSERTION  05/10/2020   Procedure:  INTRAUTERINE DEVICE (IUD) INSERTION;  Surgeon: Tereso Newcomer, MD;  Location: [facility] LD ORS;  Service: Obstetrics;;     OB History     Gravida  4   Para  2   Term  2   Preterm      AB  2   Living  2      SAB  2   IAB      Ectopic      Multiple  0   Live Births  2           Family History  Problem Relation Age of Onset   Diabetes Paternal Uncle    Cancer Maternal Grandmother    Diabetes Maternal Grandmother    Hypertension Maternal Grandmother    Cancer Paternal Grandmother        liver    Social History   Tobacco Use   Smoking status: Never   Smokeless tobacco: Never  Vaping Use   Vaping Use: Former  Substance Use Topics   Alcohol use: Yes    Comment: social   Drug use: Yes    Frequency: 7.0 times per week    Types: Marijuana    Comment: last used before 09/19/19     Home Medications Prior to Admission medications   Medication Sig Start Date End Date Taking? Authorizing Provider  cephALEXin (KEFLEX) 500 MG capsule Take 1 capsule (500 mg total)  by mouth 4 (four) times daily for 10 days. 01/07/21 01/17/21 Yes Nuel Dejaynes, PA-C  ondansetron (ZOFRAN ODT) 4 MG disintegrating tablet Take 1 tablet (4 mg total) by mouth every 8 (eight) hours as needed for nausea or vomiting. 01/07/21  Yes Hyman Hopes, Meldon Hanzlik, PA-C    Allergies    Latex  Review of Systems   Review of Systems  Constitutional:  Positive for fever.  Gastrointestinal:  Positive for abdominal pain, nausea and vomiting. Negative for constipation and diarrhea.  Genitourinary:  Positive for dysuria, flank pain, frequency and hematuria.  All other systems reviewed and are negative.  Physical Exam Updated Vital Signs BP 122/63   Pulse 89   Temp 98.2 F (36.8 C) (Oral)   Resp 20   Ht 5\' 8"  (1.727 m)   Wt 84.8 kg   LMP 12/30/2020 Comment: delivered full term baby in dec 2021.  SpO2 100%   Breastfeeding No   BMI 28.43 kg/m   Physical Exam Vitals and nursing note reviewed.   Constitutional:      Appearance: She is not ill-appearing or diaphoretic.     Comments: Uncomfortable appearing female. Writhing around in the bed.   HENT:     Head: Normocephalic and atraumatic.  Eyes:     Conjunctiva/sclera: Conjunctivae normal.  Cardiovascular:     Rate and Rhythm: Regular rhythm. Tachycardia present.  Pulmonary:     Effort: Pulmonary effort is normal.     Breath sounds: Normal breath sounds. No wheezing, rhonchi or rales.  Abdominal:     Palpations: Abdomen is soft.     Tenderness: There is abdominal tenderness. There is right CVA tenderness and left CVA tenderness. There is no guarding or rebound.     Comments: Soft, diffuse abdominal TTP and bilateral flank TTP, +BS throughout, no r/g/r, neg murphy's, neg mcburney's  Musculoskeletal:     Cervical back: Neck supple.  Skin:    General: Skin is warm and dry.  Neurological:     Mental Status: She is alert.    ED Results / Procedures / Treatments   Labs (all labs ordered are listed, but only abnormal results are displayed) Labs Reviewed  URINALYSIS, ROUTINE W REFLEX MICROSCOPIC - Abnormal; Notable for the following components:      Result Value   APPearance HAZY (*)    Specific Gravity, Urine <1.005 (*)    Hgb urine dipstick SMALL (*)    Leukocytes,Ua LARGE (*)    All other components within normal limits  CBC WITH DIFFERENTIAL/PLATELET - Abnormal; Notable for the following components:   WBC 14.6 (*)    Neutro Abs 12.1 (*)    Monocytes Absolute 1.3 (*)    Abs Immature Granulocytes 0.09 (*)    All other components within normal limits  URINALYSIS, MICROSCOPIC (REFLEX) - Abnormal; Notable for the following components:   Bacteria, UA MANY (*)    All other components within normal limits  URINE CULTURE  BASIC METABOLIC PANEL  LACTIC ACID, PLASMA  PREGNANCY, URINE  LACTIC ACID, PLASMA    EKG None  Radiology No results found.  Procedures Procedures   Medications Ordered in ED Medications   0.9 %  sodium chloride infusion ( Intravenous New Bag/Given 01/07/21 1230)  acetaminophen (TYLENOL) tablet 1,000 mg (1,000 mg Oral Given 01/07/21 0956)  sodium chloride 0.9 % bolus 1,000 mL ( Intravenous Infusion Verify 01/07/21 1150)  ondansetron (ZOFRAN) injection 4 mg (4 mg Intravenous Given 01/07/21 1039)  ketorolac (TORADOL) 30 MG/ML injection 30 mg (30 mg  Intravenous Given 01/07/21 1040)  cefTRIAXone (ROCEPHIN) 2 g in sodium chloride 0.9 % 100 mL IVPB (2 g Intravenous New Bag/Given 01/07/21 1232)    ED Course  I have reviewed the triage vital signs and the nursing notes.  Pertinent labs & imaging results that were available during my care of the patient were reviewed by me and considered in my medical decision making (see chart for details).  Clinical Course as of 01/07/21 1313  Wed Jan 07, 2021  1037 WBC(!): 14.6 [MV]    Clinical Course User Index [MV] Tanda Rockers, [place]   MDM Rules/Calculators/A&P                           25 year old Spanish-speaking female who presents to the ED today with complaint of bilateral flank pain, abdominal pain, urinary symptoms as well as fevers with history of pyelo.  On arrival to the ED patient is febrile at 1-2.4, tachycardic at 100.  Nontachypneic.  Blood pressure stable at 122/98.  She was provided Tylenol for her fever.  On my exam she is writhing around in pain, uncomfortable.  She has diffuse abdominal tenderness palpation without rebound or guarding, abdomen is soft.  Bilateral flank tenderness palpation.  I do suspect pyelonephritis at this time.  We will plan for labs including CBC, BMP, lactic acid, urinalysis.  Will provide fluids, antiemetics, additional pain medication in the form of Toradol.  CBC with leukocytosis 14,600 with left shift BMP without electrolyte abnormalities Lactic acid WNL 1.0 UPT negative U/A with small hgb on dipstick however 0-5 RBCs on HPF. Large leuks, > 50 WBCs and many bacteria consistent with UTI. Will send  for culture. 2g IV rocephin ordered.   On reevaluation pt resting comfortably. Reports significant improvement in her symptoms. She has been able to tolerate PO here without difficulty. Will treat for pyelo at this time. Pt instructed to follow up with PCP regarding ED visit and to have U/A rechecked in 1-2 weeks. She is instructed on strict return precautions. She is in agreement with plan and stable for discharge home.   This note was prepared using Dragon voice recognition software and may include unintentional dictation errors due to the inherent limitations of voice recognition software.   Final Clinical Impression(s) / ED Diagnoses Final diagnoses:  Pyelonephritis    Rx / DC Orders ED Discharge Orders          Ordered    cephALEXin (KEFLEX) 500 MG capsule  4 times daily        01/07/21 1312    ondansetron (ZOFRAN ODT) 4 MG disintegrating tablet  Every 8 hours PRN        01/07/21 1312             Discharge Instructions      Please pick up antibiotics and take as prescribed. I have also prescribed nausea medication to take as needed.   Follow up with your PCP in 1-2 weeks for recheck of your urine to ensure resolution of infection.   You can take Ibuprofen and Tylenol as needed for pain and fevers > 100.4  Return to the ED IMMEDIATELY For any new/worsening symptoms including worsening pain, persistent fevers despite being on antibiotics for > 48 hours, excessive vomiting, or any other concerning symptoms       Tanda Rockers, PA-C 01/07/21 1313    Pricilla Loveless, MD 01/10/21 [phone]

## 2021-01-09 LAB — URINE CULTURE: Culture: >=100000 — AB

## 2021-01-10 ENCOUNTER — Telehealth: Payer: Self-pay | Admitting: Emergency Medicine

## 2021-01-10 NOTE — Telephone Encounter (Signed)
Post ED Visit - Positive Culture Follow-up  Culture report reviewed by antimicrobial stewardship pharmacist: [facility] []  , Pharm.D. []  Enzo Bi, Pharm.D., BCPS AQ-ID []  , Pharm.D., BCPS []  Celedonio Miyamoto, Pharm.D., BCPS []  Roy, Garvin Fila.D., BCPS, AAHIVP []  , Pharm.D., BCPS, AAHIVP []  Georgina Pillion, PharmD, BCPS []  , PharmD, BCPS []  [place], PharmD, BCPS []  [address], PharmD []  , PharmD, BCPS [x]  Estella Husk, PharmD  Pharmacy Team []  Lysle Pearl, PharmD []  , PharmD []  Phillips Climes, PharmD []  , Rph []  Agapito Games) , PharmD []  Verlan Friends, PharmD []  , PharmD []  Mervyn Gay, PharmD []  , PharmD []  Vinnie Level, PharmD []  [facility], PharmD []  , PharmD []  Len Childs, PharmD   Positive urine culture Treated with Cephalexin, organism sensitive to the same and no further patient follow-up is required at this time.  01/10/2021, 4:17 PM

## 2021-08-22 ENCOUNTER — Encounter (HOSPITAL_BASED_OUTPATIENT_CLINIC_OR_DEPARTMENT_OTHER): Payer: Self-pay | Admitting: Emergency Medicine

## 2021-08-22 ENCOUNTER — Other Ambulatory Visit: Payer: Self-pay

## 2021-08-22 ENCOUNTER — Emergency Department (HOSPITAL_BASED_OUTPATIENT_CLINIC_OR_DEPARTMENT_OTHER): Payer: Medicaid Other

## 2021-08-22 ENCOUNTER — Emergency Department (HOSPITAL_BASED_OUTPATIENT_CLINIC_OR_DEPARTMENT_OTHER)
Admission: EM | Admit: 2021-08-22 | Discharge: 2021-08-22 | Disposition: A | Discharge status: Home / Self Care | Payer: Medicaid Other | Attending: Emergency Medicine | Admitting: Emergency Medicine

## 2021-08-22 DIAGNOSIS — F17200 Nicotine dependence, unspecified, uncomplicated: Secondary | ICD-10-CM | POA: Diagnosis not present

## 2021-08-22 DIAGNOSIS — R072 Precordial pain: Secondary | ICD-10-CM | POA: Diagnosis present

## 2021-08-22 DIAGNOSIS — R1110 Vomiting, unspecified: Secondary | ICD-10-CM | POA: Insufficient documentation

## 2021-08-22 DIAGNOSIS — R0789 Other chest pain: Secondary | ICD-10-CM

## 2021-08-22 DIAGNOSIS — R059 Cough, unspecified: Secondary | ICD-10-CM | POA: Insufficient documentation

## 2021-08-22 LAB — URINALYSIS, ROUTINE W REFLEX MICROSCOPIC
Bilirubin Urine: NEGATIVE
Glucose, UA: NEGATIVE mg/dL
Hgb urine dipstick: NEGATIVE
Ketones, ur: NEGATIVE mg/dL
Nitrite: NEGATIVE
Protein, ur: NEGATIVE mg/dL
Specific Gravity, Urine: 1.015 (ref 1.005–1.030)
pH: 7 (ref 5.0–8.0)

## 2021-08-22 LAB — COMPREHENSIVE METABOLIC PANEL
ALT: 20 U/L (ref 0–44)
AST: 22 U/L (ref 15–41)
Albumin: 4 g/dL (ref 3.5–5.0)
Alkaline Phosphatase: 49 U/L (ref 38–126)
Anion gap: 6 (ref 5–15)
BUN: 12 mg/dL (ref 6–20)
CO2: 24 mmol/L (ref 22–32)
Calcium: 9 mg/dL (ref 8.9–10.3)
Chloride: 108 mmol/L (ref 98–111)
Creatinine, Ser: 0.65 mg/dL (ref 0.44–1.00)
GFR, Estimated: >60 mL/min (ref >60)
Glucose, Bld: 99 mg/dL (ref 70–99)
Potassium: 4.2 mmol/L (ref 3.5–5.1)
Sodium: 138 mmol/L (ref 135–145)
Total Bilirubin: 0.5 mg/dL (ref 0.3–1.2)
Total Protein: 7.4 g/dL (ref 6.5–8.1)

## 2021-08-22 LAB — CBC WITH DIFFERENTIAL/PLATELET
Abs Immature Granulocytes: 0.02 10*3/uL (ref 0.00–0.07)
Basophils Absolute: 0 10*3/uL (ref 0.0–0.1)
Basophils Relative: 0 %
Eosinophils Absolute: 0.1 10*3/uL (ref 0.0–0.5)
Eosinophils Relative: 1 %
HCT: 42 % (ref 36.0–46.0)
Hemoglobin: 14.2 g/dL (ref 12.0–15.0)
Immature Granulocytes: 0 %
Lymphocytes Relative: 26 %
Lymphs Abs: 2.3 10*3/uL (ref 0.7–4.0)
MCH: 31.8 pg (ref 26.0–34.0)
MCHC: 33.8 g/dL (ref 30.0–36.0)
MCV: 94 fL (ref 80.0–100.0)
Monocytes Absolute: 0.5 10*3/uL (ref 0.1–1.0)
Monocytes Relative: 6 %
Neutro Abs: 5.8 10*3/uL (ref 1.7–7.7)
Neutrophils Relative %: 67 %
Platelets: 191 10*3/uL (ref 150–400)
RBC: 4.47 MIL/uL (ref 3.87–5.11)
RDW: 12.3 % (ref 11.5–15.5)
WBC: 8.8 10*3/uL (ref 4.0–10.5)
nRBC: 0 % (ref 0.0–0.2)

## 2021-08-22 LAB — URINALYSIS, MICROSCOPIC (REFLEX)

## 2021-08-22 LAB — PREGNANCY, URINE: Preg Test, Ur: NEGATIVE

## 2021-08-22 LAB — D-DIMER, QUANTITATIVE: D-Dimer, Quant: 0.46 ug/mL-FEU (ref 0.00–0.50)

## 2021-08-22 LAB — TROPONIN I (HIGH SENSITIVITY)
Troponin I (High Sensitivity): <2 ng/L (ref <18)
Troponin I (High Sensitivity): <2 ng/L (ref <18)

## 2021-08-22 LAB — LIPASE, BLOOD: Lipase: 31 U/L (ref 11–51)

## 2021-08-22 MED ORDER — SODIUM CHLORIDE 0.9 % IV BOLUS
1000.0000 mL | Freq: Once | INTRAVENOUS | Status: AC
  Administered 2021-08-22: 1000 mL via INTRAVENOUS

## 2021-08-22 MED ORDER — DICLOFENAC SODIUM 1 % EX GEL: 4.0000 g | Freq: Four times a day (QID) | CUTANEOUS | 1 refills | Status: AC

## 2021-08-22 MED ORDER — ACETAMINOPHEN 325 MG PO TABS
650.0000 mg | ORAL_TABLET | Freq: Once | ORAL | Status: AC
  Administered 2021-08-22: 650 mg via ORAL
  Filled 2021-08-22: qty 2

## 2021-08-22 MED ORDER — METHOCARBAMOL 500 MG PO TABS: 500.0000 mg | ORAL_TABLET | Freq: Two times a day (BID) | ORAL | 0 refills | Status: AC

## 2021-08-22 MED ORDER — FENTANYL CITRATE PF 50 MCG/ML IJ SOSY
50.0000 ug | PREFILLED_SYRINGE | INTRAMUSCULAR | Status: DC | PRN
  Administered 2021-08-22: 50 ug via INTRAVENOUS
  Filled 2021-08-22: qty 1

## 2021-08-22 NOTE — ED Provider Notes (Signed)
?[facility] EMERGENCY DEPARTMENT ?Provider Note ? ? ?CSN: [id number] ?Arrival date & time: 08/22/21  [id number] ? ?  ? ?History ? ?Chief Complaint  ?Patient presents with  ? Chest Pain  ? ? ?Paula Gamble is a 26 y.o. female. ? ? ?Chest Pain ?Patient speaks Spanish ? ?Spanish-language interpreter was used for the entirety of visit. ? ? ?Patient presents emergency room today with chief complaint of chest pain.  She states is right-sided although it seems also be hurting in the middle of her sternum.  She states that it hurts to take a deep breath.  She states that her symptoms seem to began at 8 AM this morning when she woke up.  She states she had a similar episode several months ago where she had some pain in her chest that was worse with deep breathing. ? ?She denies any leg pain and denies any chest trauma or new exercises.  She does cough occasionally but is not coughing up any sputum or purulence ? ?No recent surgeries, hospitalization, long travel, hemoptysis, estrogen containing OCP, cancer history.  No unilateral leg swelling.  No history of PE or VTE.  ? ? ?  ? ?Home Medications ?Prior to Admission medications   ?Medication Sig Start Date End Date Taking? Authorizing Provider  ?diclofenac Sodium (VOLTAREN) 1 % GEL Apply 4 g topically 4 (four) times daily. 08/22/21  Yes Erynne Kealey, Kathleene Hazel, PA  ?methocarbamol (ROBAXIN) 500 MG tablet Take 1 tablet (500 mg total) by mouth 2 (two) times daily. 08/22/21  Yes Vincent Streater, Kathleene Hazel, PA  ?ondansetron (ZOFRAN ODT) 4 MG disintegrating tablet Take 1 tablet (4 mg total) by mouth every 8 (eight) hours as needed for nausea or vomiting. 01/07/21   Eustaquio Maize, PA-C  ?   ? ?Allergies    ?Latex   ? ?Review of Systems   ?Review of Systems  ?Cardiovascular:  Positive for chest pain.  ? ?Physical Exam ?Updated Vital Signs ?BP 117/89   Pulse 65   Temp 99.1 ?F (37.3 ?C)   Resp 18   Ht 5\' 4"  (1.626 m)   Wt 76.7 kg   LMP 07/25/2021   SpO2 99%   BMI 29.01 kg/m?  ?Physical  Exam ?Vitals and nursing note reviewed.  ?Constitutional:   ?   General: She is not in acute distress. ?HENT:  ?   Head: Normocephalic and atraumatic.  ?   Nose: Nose normal.  ?   Mouth/Throat:  ?   Mouth: Mucous membranes are moist.  ?Eyes:  ?   General: No scleral icterus. ?Cardiovascular:  ?   Rate and Rhythm: Normal rate and regular rhythm.  ?   Pulses: Normal pulses.  ?   Heart sounds: Normal heart sounds.  ?Pulmonary:  ?   Effort: Pulmonary effort is normal. No respiratory distress.  ?   Breath sounds: Normal breath sounds. No wheezing.  ?Chest:  ?   Chest wall: Tenderness present.  ?Abdominal:  ?   Palpations: Abdomen is soft.  ?   Tenderness: There is no abdominal tenderness. There is no guarding or rebound.  ?Musculoskeletal:  ?   Cervical back: Normal range of motion.  ?   Right lower leg: No edema.  ?   Left lower leg: No edema.  ?Skin: ?   General: Skin is warm and dry.  ?   Capillary Refill: Capillary refill takes less than 2 seconds.  ?Neurological:  ?   Mental Status: She is alert. Mental status is at  baseline.  ?Psychiatric:     ?   Mood and Affect: Mood normal.     ?   Behavior: Behavior normal.  ? ? ?ED Results / Procedures / Treatments   ?Labs ?(all labs ordered are listed, but only abnormal results are displayed) ?Labs Reviewed  ?URINALYSIS, ROUTINE W REFLEX MICROSCOPIC - Abnormal; Notable for the following components:  ?    Result Value  ? Leukocytes,Ua MODERATE (*)   ? All other components within normal limits  ?URINALYSIS, MICROSCOPIC (REFLEX) - Abnormal; Notable for the following components:  ? Bacteria, UA RARE (*)   ? All other components within normal limits  ?COMPREHENSIVE METABOLIC PANEL  ?LIPASE, BLOOD  ?CBC WITH DIFFERENTIAL/PLATELET  ?PREGNANCY, URINE  ?D-DIMER, QUANTITATIVE  ?TROPONIN I (HIGH SENSITIVITY)  ?TROPONIN I (HIGH SENSITIVITY)  ? ? ?EKG ?EKG Interpretation ? ?Date/Time:  Saturday August 22 2021 09:55:03 EDT ?Ventricular Rate:  78 ?PR Interval:  135 ?QRS Duration: 89 ?QT  Interval:  366 ?QTC Calculation: 417 ?R Axis:   64 ?Text Interpretation: Sinus rhythm No acute changes No significant change since last tracing Confirmed by Varney Biles [phone]) on 08/22/2021 12:23:49 PM ? ?Radiology ?DG Chest Port 1 View ? ?Result Date: 08/22/2021 ?CLINICAL DATA:  Chest pain and shortness of breath. EXAM: PORTABLE CHEST 1 VIEW COMPARISON:  09/16/2015 chest radiograph FINDINGS: The cardiomediastinal silhouette is unremarkable. There is no evidence of focal airspace disease, pulmonary edema, suspicious pulmonary nodule/mass, pleural effusion, or pneumothorax. No acute bony abnormalities are identified. IMPRESSION: No active disease. Electronically Signed   By: Margarette Canada M.D.   On: 08/22/2021 14:14   ? ?Procedures ?Procedures  ? ? ?Medications Ordered in ED ?Medications  ?fentaNYL (SUBLIMAZE) injection 50 mcg (50 mcg Intravenous Given 08/22/21 1022)  ?sodium chloride 0.9 % bolus 1,000 mL (0 mLs Intravenous Stopped 08/22/21 1352)  ?acetaminophen (TYLENOL) tablet 650 mg (650 mg Oral Given 08/22/21 1022)  ? ? ?ED Course/ Medical Decision Making/ A&P ?Clinical Course as of 08/22/21 1419  ?Sat Aug 22, 2021  ?1008 Some CP today sternal/right sided and upper back.  ? [WF]  ?1010 1 episode of NBNB emesis [WF]  ?1014 No recent surgeries, hospitalization, long travel, hemoptysis, estrogen containing OCP, cancer history.  No unilateral leg swelling.  No history of PE or VTE. ? [WF]  ?1017 OCP + smoking [WF]  ?  ?Clinical Course User Index ?[WF] Tedd Sias, [place]  ? ?                        ?Medical Decision Making ?Amount and/or Complexity of Data Reviewed ?Labs: ordered. ?Radiology: ordered. ? ?Risk ?OTC drugs. ?Prescription drug management. ? ? ?This patient presents to the ED for concern of chest pain, SOB, this involves a number of treatment options, and is a complaint that carries with it a high risk of complications and morbidity.  The differential diagnosis includes The emergent causes of chest pain  include: Acute coronary syndrome, tamponade, pericarditis/myocarditis, aortic dissection, pulmonary embolism, tension pneumothorax, pneumonia, and esophageal rupture. ? ?I do not believe the patient has an emergent cause of chest pain, other urgent/non-acute considerations include, but are not limited to: chronic angina, aortic stenosis, cardiomyopathy, mitral valve prolapse, pulmonary hypertension, aortic insufficiency, right ventricular hypertrophy, pleuritis, bronchitis, pneumothorax, tumor, gastroesophageal reflux disease (GERD), esophageal spasm, Mallory-Weiss syndrome, peptic ulcer disease, pancreatitis, functional gastrointestinal pain, cervical or thoracic disk disease or arthritis, shoulder arthritis, costochondritis, subacromial bursitis, anxiety or panic attack, herpes zoster, breast disorders,  chest wall tumors, thoracic outlet syndrome, mediastinitis. ? ? ? ?Co morbidities: ?Discussed in HPI ? ? ?Brief History: ? ?Patient speaks Spanish ? ?Spanish-language interpreter was used for the entirety of visit. ? ? ?Patient presents emergency room today with chief complaint of chest pain.  She states is right-sided although it seems also be hurting in the middle of her sternum.  She states that it hurts to take a deep breath.  She states that her symptoms seem to began at 8 AM this morning when she woke up.  She states she had a similar episode several months ago where she had some pain in her chest that was worse with deep breathing. ? ?She denies any leg pain and denies any chest trauma or new exercises.  She does cough occasionally but is not coughing up any sputum or purulence ? ?No recent surgeries, hospitalization, long travel, hemoptysis, estrogen containing OCP, cancer history.  No unilateral leg swelling.  No history of PE or VTE.  ? ? ?Physical exam relatively unremarkable. ?We will provide with small dose of fentanyl 50 mcg and 1 dose of tablet Tylenol. ? ? ?EMR reviewed including pt PMHx, past  surgical history and past visits to ER.  ? ?See HPI for more details ? ? ?Lab Tests: ? ? ?I ordered and independently interpreted labs. Labs notable for negative D-dimer I low suspicion for pulmonary embolism.  Lipase within

## 2021-08-22 NOTE — ED Triage Notes (Signed)
Spanish speaking patient, translator used, PT arrives pov, steady gait to room, c/o right side CP radiating to middle, upper back and shob upon waking at 0800 today. Similar symptoms "months ago".  ?

## 2021-08-22 NOTE — Discharge Instructions (Addendum)
Your workup today was quite reassuring.  Ultimately with such a reassuring work-up today I feel confident sending you home with the plan to follow-up with your primary care doctor within the next week and return to the ER for any new or worsening symptoms. ? ? ?Your examination today is most concerning for a muscular injury ?1. Medications: alternate ibuprofen and tylenol for pain control, take all usual home medications as they are prescribed ?2. Treatment: rest, ice, elevate and use an ACE wrap or other compressive therapy to decrease swelling. Also drink plenty of fluids and do plenty of gentle stretching and move the affected muscle through its normal range of motion to prevent stiffness. ?3. Follow Up: If your symptoms do not improve please follow up with orthopedics/sports medicine or your PCP for discussion of your diagnoses and further evaluation after today's visit; if you do not have a primary care doctor use the resource guide provided to find one; Please return to the ER for worsening symptoms or other concerns.   ? ? ? ?Tu evaluaci?n de hoy fue bastante tranquilizadora. En ?ltima instancia, con un examen tan tranquilizador hoy, me siento seguro de enviarlo a casa con el plan de seguimiento con su m?dico de atenci?n primaria dentro de la pr?xima semana y regresar a la sala de emergencias por cualquier s?ntoma nuevo o que empeore. ? ? ?Su examen de hoy es m?s preocupante por una lesi?n muscular ?1. Medicamentos: alterne ibuprofeno y tylenol para Human resources officer, tome todos los medicamentos habituales en el hogar tal como se los recetan ?2. Tratamiento: reposo, hielo, elevaci?n y uso de Neomia Dear venda ACE u otra terapia de compresi?n para disminuir la hinchaz?n. Tambi?n beba muchos l?quidos y haga muchos estiramientos suaves y mueva el m?sculo afectado a trav?s de su rango normal de movimiento para Transport planner rigidez. ?3. Seguimiento: si sus s?ntomas no mejoran, haga un seguimiento con ortopedia/medicina  deportiva o su PCP para analizar sus diagn?sticos y [place] evaluaci?n adicional despu?s de la visita de hoy; si no tiene un m?dico de atenci?n primaria, use la gu?a de recursos provista para encontrar uno; Regrese a la sala de emergencias si los s?ntomas empeoran u otras inquietudes. ?

## 2021-10-14 ENCOUNTER — Emergency Department (HOSPITAL_BASED_OUTPATIENT_CLINIC_OR_DEPARTMENT_OTHER)
Admission: EM | Admit: 2021-10-14 | Discharge: 2021-10-14 | Disposition: A | Discharge status: Home / Self Care | Payer: Medicaid Other | Attending: Emergency Medicine | Admitting: Emergency Medicine

## 2021-10-14 ENCOUNTER — Other Ambulatory Visit: Payer: Self-pay

## 2021-10-14 ENCOUNTER — Encounter (HOSPITAL_BASED_OUTPATIENT_CLINIC_OR_DEPARTMENT_OTHER): Payer: Self-pay

## 2021-10-14 DIAGNOSIS — Z91040 Latex allergy status: Secondary | ICD-10-CM | POA: Insufficient documentation

## 2021-10-14 DIAGNOSIS — R112 Nausea with vomiting, unspecified: Secondary | ICD-10-CM | POA: Diagnosis present

## 2021-10-14 DIAGNOSIS — R197 Diarrhea, unspecified: Secondary | ICD-10-CM | POA: Insufficient documentation

## 2021-10-14 DIAGNOSIS — R109 Unspecified abdominal pain: Secondary | ICD-10-CM | POA: Diagnosis not present

## 2021-10-14 LAB — CBC
HCT: 41.3 % (ref 36.0–46.0)
Hemoglobin: 14 g/dL (ref 12.0–15.0)
MCH: 31.9 pg (ref 26.0–34.0)
MCHC: 33.9 g/dL (ref 30.0–36.0)
MCV: 94.1 fL (ref 80.0–100.0)
Platelets: 219 10*3/uL (ref 150–400)
RBC: 4.39 MIL/uL (ref 3.87–5.11)
RDW: 12 % (ref 11.5–15.5)
WBC: 13.7 10*3/uL — ABNORMAL HIGH (ref 4.0–10.5)
nRBC: 0 % (ref 0.0–0.2)

## 2021-10-14 LAB — URINALYSIS, ROUTINE W REFLEX MICROSCOPIC
Bilirubin Urine: NEGATIVE
Glucose, UA: NEGATIVE mg/dL
Hgb urine dipstick: NEGATIVE
Ketones, ur: 15 mg/dL — AB
Leukocytes,Ua: NEGATIVE
Nitrite: NEGATIVE
Protein, ur: NEGATIVE mg/dL
Specific Gravity, Urine: 1.025 (ref 1.005–1.030)
pH: 5.5 (ref 5.0–8.0)

## 2021-10-14 LAB — COMPREHENSIVE METABOLIC PANEL
ALT: 16 U/L (ref 0–44)
AST: 17 U/L (ref 15–41)
Albumin: 4.3 g/dL (ref 3.5–5.0)
Alkaline Phosphatase: 52 U/L (ref 38–126)
Anion gap: 8 (ref 5–15)
BUN: 13 mg/dL (ref 6–20)
CO2: 20 mmol/L — ABNORMAL LOW (ref 22–32)
Calcium: 9 mg/dL (ref 8.9–10.3)
Chloride: 111 mmol/L (ref 98–111)
Creatinine, Ser: 0.56 mg/dL (ref 0.44–1.00)
GFR, Estimated: >60 mL/min (ref >60)
Glucose, Bld: 92 mg/dL (ref 70–99)
Potassium: 3.6 mmol/L (ref 3.5–5.1)
Sodium: 139 mmol/L (ref 135–145)
Total Bilirubin: 0.7 mg/dL (ref 0.3–1.2)
Total Protein: 7.4 g/dL (ref 6.5–8.1)

## 2021-10-14 LAB — PREGNANCY, URINE: Preg Test, Ur: NEGATIVE

## 2021-10-14 LAB — LIPASE, BLOOD: Lipase: 23 U/L (ref 11–51)

## 2021-10-14 MED ORDER — LOPERAMIDE HCL 2 MG PO CAPS
4.0000 mg | ORAL_CAPSULE | Freq: Once | ORAL | Status: AC
Start: 2021-10-14 — End: 2021-10-14
  Administered 2021-10-14: 4 mg via ORAL
  Filled 2021-10-14: qty 2

## 2021-10-14 MED ORDER — SODIUM CHLORIDE 0.9 % IV BOLUS
1000.0000 mL | Freq: Once | INTRAVENOUS | Status: AC
  Administered 2021-10-14: 1000 mL via INTRAVENOUS

## 2021-10-14 MED ORDER — DICYCLOMINE HCL 20 MG PO TABS: 20.0000 mg | ORAL_TABLET | Freq: Two times a day (BID) | ORAL | 0 refills | Status: AC | PRN

## 2021-10-14 MED ORDER — ONDANSETRON HCL 4 MG/2ML IJ SOLN
4.0000 mg | Freq: Once | INTRAMUSCULAR | Status: AC
  Administered 2021-10-14: 4 mg via INTRAVENOUS
  Filled 2021-10-14: qty 2

## 2021-10-14 MED ORDER — LOPERAMIDE HCL 2 MG PO CAPS: 2.0000 mg | ORAL_CAPSULE | Freq: Four times a day (QID) | ORAL | 0 refills | Status: AC | PRN

## 2021-10-14 MED ORDER — ONDANSETRON HCL 4 MG PO TABS: 4.0000 mg | ORAL_TABLET | Freq: Four times a day (QID) | ORAL | 0 refills | Status: AC

## 2021-10-14 MED ORDER — DICYCLOMINE HCL 10 MG PO CAPS
10.0000 mg | ORAL_CAPSULE | Freq: Once | ORAL | Status: AC
  Administered 2021-10-14: 10 mg via ORAL
  Filled 2021-10-14: qty 1

## 2021-10-14 NOTE — ED Provider Notes (Signed)
[facility] EMERGENCY DEPARTMENT Provider Note   CSN: [id number] Arrival date & time: 10/14/21  1520     History  Chief Complaint  Patient presents with   Abdominal Pain    Paula Gamble is a 26 y.o. female here presenting with abdominal pain and nausea vomiting.  Patient states that her daughter was sick with viral gastroenteritis.  Patient states that she woke up today and started vomiting and having loose stools.  She also has diffuse abdominal cramps as well.  Denies any recent travel.  Denies any history of C. difficile.  The history is provided by the patient.      Home Medications Prior to Admission medications   Medication Sig Start Date End Date Taking? Authorizing Provider  diclofenac Sodium (VOLTAREN) 1 % GEL Apply 4 g topically 4 (four) times daily. 08/22/21   Gailen Shelter, PA  methocarbamol (ROBAXIN) 500 MG tablet Take 1 tablet (500 mg total) by mouth 2 (two) times daily. 08/22/21   Gailen Shelter, PA  ondansetron (ZOFRAN ODT) 4 MG disintegrating tablet Take 1 tablet (4 mg total) by mouth every 8 (eight) hours as needed for nausea or vomiting. 01/07/21   Tanda Rockers, PA-C      Allergies    Latex    Review of Systems   Review of Systems  Gastrointestinal:  Positive for abdominal pain, diarrhea and vomiting.  All other systems reviewed and are negative.  Physical Exam Updated Vital Signs BP (!) 91/44   Pulse (!) 58   Temp 98.6 F (37 C) (Oral)   Resp 18   Ht 5\' 4"  (1.626 m)   Wt 80.7 kg   LMP 10/05/2021 (Exact Date)   SpO2 100%   BMI 30.55 kg/m  Physical Exam Vitals and nursing note reviewed.  Constitutional:      Comments: Slightly dehydrated   HENT:     Head: Normocephalic.     Mouth/Throat:     Pharynx: Oropharynx is clear.  Eyes:     Extraocular Movements: Extraocular movements intact.     Pupils: Pupils are equal, round, and reactive to light.  Cardiovascular:     Rate and Rhythm: Normal rate and regular rhythm.   Pulmonary:     Effort: Pulmonary effort is normal.     Breath sounds: Normal breath sounds.  Abdominal:     General: Abdomen is flat.     Comments: Mild diffuse tenderness, no rebound or guarding   Skin:    General: Skin is warm.     Capillary Refill: Capillary refill takes less than 2 seconds.  Neurological:     General: No focal deficit present.     Mental Status: She is oriented to person, place, and time.  Psychiatric:        Mood and Affect: Mood normal.        Behavior: Behavior normal.    ED Results / Procedures / Treatments   Labs (all labs ordered are listed, but only abnormal results are displayed) Labs Reviewed  COMPREHENSIVE METABOLIC PANEL - Abnormal; Notable for the following components:      Result Value   CO2 20 (*)    All other components within normal limits  CBC - Abnormal; Notable for the following components:   WBC 13.7 (*)    All other components within normal limits  URINALYSIS, ROUTINE W REFLEX MICROSCOPIC - Abnormal; Notable for the following components:   Ketones, ur 15 (*)    All other components within  normal limits  LIPASE, BLOOD  PREGNANCY, URINE    EKG None  Radiology No results found.  Procedures Procedures    Medications Ordered in ED Medications  sodium chloride 0.9 % bolus 1,000 mL (0 mLs Intravenous Stopped 10/14/21 1653)  ondansetron (ZOFRAN) injection 4 mg (4 mg Intravenous Given 10/14/21 1609)  loperamide (IMODIUM) capsule 4 mg (4 mg Oral Given 10/14/21 1609)  dicyclomine (BENTYL) capsule 10 mg (10 mg Oral Given 10/14/21 1609)    ED Course/ Medical Decision Making/ A&P                           Medical Decision Making Paula Gamble is a 26 y.o. female here with vomiting, diarrhea. Likely viral gastroenteritis. Daughter is sick with similar symptoms.  Plan to get CBC and CMP and lipase and UA and hydrate patient and reassess.  5:12 PM I reviewed patient's labs and urinalysis.  Patient's white blood cell count is  13.7.  Her chemistry is normal.  Urinalysis shows some ketones but no UTI.  Patient was given IV fluids and felt better.  Will discharge home with Zofran and Bentyl and Imodium.  Problems Addressed: Nausea vomiting and diarrhea: acute illness or injury  Amount and/or Complexity of Data Reviewed Labs: ordered. Decision-making details documented in ED Course.  Risk Prescription drug management.    Final Clinical Impression(s) / ED Diagnoses Final diagnoses:  None    Rx / DC Orders ED Discharge Orders     None         Charlynne Pander, MD 10/14/21 [phone]

## 2021-10-14 NOTE — ED Notes (Signed)
D/c instructions reviewed with patient using IPAD spanish interpreter. Pt verbalized understanding of d/c instructions, prescriptions and follow up care. Pt A&OX4 ambulatory at d/c with independent steady gait, NAD.

## 2021-10-14 NOTE — Discharge Instructions (Signed)
You likely have a stomach virus.  You are expected to have cramps for several days and have vomiting and diarrhea  Please take Zofran for nausea  Please stay hydrated  Take Imodium for diarrhea and Bentyl for cramps  Please read this at home for 2 days  See your doctor for follow-up  Return to ER if you have worse abdominal pain, vomiting, dehydration

## 2021-10-14 NOTE — ED Triage Notes (Signed)
Pt speaks spanish. Presents with complaint of abdominal pain , vomiting and diarrhea since this morning. Daughter is sick as well

## 2022-07-20 ENCOUNTER — Other Ambulatory Visit (HOSPITAL_COMMUNITY): Payer: Self-pay

## 2022-07-22 ENCOUNTER — Ambulatory Visit (INDEPENDENT_AMBULATORY_CARE_PROVIDER_SITE_OTHER): Payer: Self-pay | Admitting: Pharmacist

## 2022-07-22 ENCOUNTER — Other Ambulatory Visit: Payer: Self-pay

## 2022-07-22 DIAGNOSIS — Z113 Encounter for screening for infections with a predominantly sexual mode of transmission: Secondary | ICD-10-CM

## 2022-07-22 NOTE — Progress Notes (Signed)
07/22/2022  HPI: Paula Gamble is a 27 y.o. female who presents to the [place] clinic today for STI testing.  Patient Active Problem List   Diagnosis Date Noted   Liletta IUD (intrauterine device) placed 05/10/2020 05/10/2020   Gallstones 03/06/2020   Language barrier 10/31/2019   Obesity during pregnancy 10/31/2019    Patient's Medications  New Prescriptions   No medications on file  Previous Medications   DICLOFENAC SODIUM (VOLTAREN) 1 % GEL    Apply 4 g topically 4 (four) times daily.   DICYCLOMINE (BENTYL) 20 MG TABLET    Take 1 tablet (20 mg total) by mouth 2 (two) times daily as needed for spasms.   LOPERAMIDE (IMODIUM) 2 MG CAPSULE    Take 1 capsule (2 mg total) by mouth 4 (four) times daily as needed for diarrhea or loose stools.   METHOCARBAMOL (ROBAXIN) 500 MG TABLET    Take 1 tablet (500 mg total) by mouth 2 (two) times daily.   ONDANSETRON (ZOFRAN ODT) 4 MG DISINTEGRATING TABLET    Take 1 tablet (4 mg total) by mouth every 8 (eight) hours as needed for nausea or vomiting.   ONDANSETRON (ZOFRAN) 4 MG TABLET    Take 1 tablet (4 mg total) by mouth every 6 (six) hours.  Modified Medications   No medications on file  Discontinued Medications   No medications on file    Allergies: Allergies  Allergen Reactions   Latex Itching    Past Medical History: Past Medical History:  Diagnosis Date   Abnormal Pap smear of cervix 01/06/2017   LSIL - Needs repeat 04/2017   COVID-19 04/2019   History of COVID-19 10/31/2019   04/2019   Pyelonephritis     Social History: Social History   Socioeconomic History   Marital status: Married    Spouse name: Not on file   Number of children: Not on file   Years of education: Not on file   Highest education level: Not on file  Occupational History   Not on file  Tobacco Use   Smoking status: Former    Types: Cigarettes   Smokeless tobacco: Never  Vaping Use   Vaping Use: Former  Substance and Sexual Activity   Alcohol  use: Not Currently    Comment: social   Drug use: Not Currently    Frequency: 7.0 times per week    Types: Marijuana    Comment: last used before 09/19/19    Sexual activity: Yes    Birth control/protection: None, I.U.D.  Other Topics Concern   Not on file  Social History Narrative   Not on file   Social Determinants of Health   Financial Resource Strain: Not on file  Food Insecurity: No Food Insecurity (05/09/2020)   Hunger Vital Sign    Worried About Running Out of Food in the Last Year: Never true    Ran Out of Food in the Last Year: Never true  Transportation Needs: No Transportation Needs (05/09/2020)   PRAPARE - Hydrologist (Medical): No    Lack of Transportation (Non-Medical): No  Physical Activity: Not on file  Stress: Not on file  Social Connections: Not on file     Assessment: Paula Gamble presents today for STI testing after finding out that her husband had sex with a woman who is HIV+. Her husband has not been tested for HIV and she is unsure how long this relationship was going on. She reports oral, vaginal and  anal sex with her husband but she is no longer with him and does not plan to have further sexual encounters with him. She denies any other STI symptoms including genital pain, redness and discharge. She reports she sometimes gets fungal infection after her period and uses a OTC vaiginal miconizole capsule to treat. Discussed starting PrEP and she is interested in Truvada.  Counseled that Truvada is a one pill once daily regimen with or without food that can prevent HIV. Discussed the importance of taking the medication daily to provide protection and decreased adherence is associated with decreased efficacy. Also discussed how Truvada works to prevent HIV but not other STDs and encouraged the use of condoms. Counseled that Truvada is normally well tolerated, however some patients experience a "start up syndrome" with nausea, diarrhea,  dizziness, and fatigue but that those should resolve soon after starting treatment.  Advised that any nausea can be mitigated by taking it with food. Discussed that we will be periodically checking kidney function throughout treatment. Discussed how our PrEP process works here at the clinic including follow ups and lab monitoring every 3 months. She would [facility] [address].   Plan: - HIV, RPR, POC pregnancy test, urine/rectal/pharyngeal GC/CT swabs for cytology today - F/u results to see if treatment is needed - F/u HIVab to send prescription for Truvada - Appointment on 10/19/22 scheduled with amanda for PrEP follow up   Titus Dubin, PharmD PGY1 Pharmacy Resident 07/22/2022 2:13 PM

## 2022-07-22 NOTE — Progress Notes (Signed)
NEW REFERRAL TO [facility]  Alfonse Spruce, PharmD, CPP, BCIDP, [place] Clinical Pharmacist Practitioner Infectious Diseases [place]

## 2022-07-23 LAB — URINE CYTOLOGY ANCILLARY ONLY
Chlamydia: NEGATIVE
Comment: NEGATIVE
Comment: NORMAL
Neisseria Gonorrhea: NEGATIVE

## 2022-07-23 LAB — HEPATITIS B SURFACE ANTIBODY,QUALITATIVE: Hep B S Ab: NONREACTIVE

## 2022-07-23 LAB — CYTOLOGY, (ORAL, ANAL, URETHRAL) ANCILLARY ONLY
Chlamydia: NEGATIVE
Chlamydia: NEGATIVE
Comment: NEGATIVE
Comment: NORMAL
Comment: NEGATIVE
Comment: NORMAL
Neisseria Gonorrhea: NEGATIVE
Neisseria Gonorrhea: NEGATIVE

## 2022-07-23 LAB — HEPATITIS C ANTIBODY: Hepatitis C Ab: NONREACTIVE

## 2022-07-23 LAB — HIV ANTIBODY (ROUTINE TESTING W REFLEX): HIV 1&2 Ab, 4th Generation: NONREACTIVE

## 2022-07-23 LAB — RPR: RPR Ser Ql: NONREACTIVE

## 2022-07-23 LAB — HEPATITIS B SURFACE ANTIGEN: Hepatitis B Surface Ag: NONREACTIVE

## 2022-07-23 LAB — HEPATITIS A ANTIBODY, TOTAL: Hepatitis A AB,Total: REACTIVE — AB

## 2022-07-26 ENCOUNTER — Other Ambulatory Visit: Payer: Self-pay

## 2022-07-26 ENCOUNTER — Telehealth: Payer: Self-pay

## 2022-07-26 ENCOUNTER — Other Ambulatory Visit: Payer: Self-pay | Admitting: Pharmacist

## 2022-07-26 ENCOUNTER — Other Ambulatory Visit (HOSPITAL_COMMUNITY): Payer: Self-pay

## 2022-07-26 DIAGNOSIS — Z2981 Encounter for HIV pre-exposure prophylaxis: Secondary | ICD-10-CM

## 2022-07-26 DIAGNOSIS — Z113 Encounter for screening for infections with a predominantly sexual mode of transmission: Secondary | ICD-10-CM

## 2022-07-26 MED ORDER — EMTRICITABINE-TENOFOVIR DF 200-300 MG PO TABS: 1.0000 | ORAL_TABLET | Freq: Every day | ORAL | 0 refills | Status: DC

## 2022-07-26 MED ORDER — TRUVADA 200-300 MG PO TABS: 1.0000 | ORAL_TABLET | Freq: Every day | ORAL | 2 refills | Status: AC

## 2022-07-26 NOTE — Telephone Encounter (Signed)
[facility] Patient Advocate Encounter  Completed and sent [employer] Advancing Access application for Truvada for this patient who is uninsured.    Patient is approved 07/26/22 through 05/24/23.        Ileene Patrick, [facility] Patient [facility] Phone: [phone] Fax:  [phone]

## 2022-09-22 ENCOUNTER — Other Ambulatory Visit: Payer: Self-pay

## 2022-10-18 NOTE — Progress Notes (Unsigned)
10/18/2022  HPI: Paula Gamble is a 27 y.o. female who presents to the [facility] clinic today for STI testing.  Patient Active Problem List   Diagnosis Date Noted   Liletta IUD (intrauterine device) placed 05/10/2020 05/10/2020   Gallstones 03/06/2020   Language barrier 10/31/2019   Obesity during pregnancy 10/31/2019    Patient's Medications  New Prescriptions   No medications on file  Previous Medications   DICLOFENAC SODIUM (VOLTAREN) 1 % GEL    Apply 4 g topically 4 (four) times daily.   DICYCLOMINE (BENTYL) 20 MG TABLET    Take 1 tablet (20 mg total) by mouth 2 (two) times daily as needed for spasms.   LOPERAMIDE (IMODIUM) 2 MG CAPSULE    Take 1 capsule (2 mg total) by mouth 4 (four) times daily as needed for diarrhea or loose stools.   METHOCARBAMOL (ROBAXIN) 500 MG TABLET    Take 1 tablet (500 mg total) by mouth 2 (two) times daily.   ONDANSETRON (ZOFRAN ODT) 4 MG DISINTEGRATING TABLET    Take 1 tablet (4 mg total) by mouth every 8 (eight) hours as needed for nausea or vomiting.   ONDANSETRON (ZOFRAN) 4 MG TABLET    Take 1 tablet (4 mg total) by mouth every 6 (six) hours.   TRUVADA 200-300 MG TABLET    Take 1 tablet by mouth daily.  Modified Medications   No medications on file  Discontinued Medications   No medications on file    Allergies: Allergies  Allergen Reactions   Latex Itching    Past Medical History: Past Medical History:  Diagnosis Date   Abnormal Pap smear of cervix 01/06/2017   LSIL - Needs repeat 04/2017   COVID-19 04/2019   History of COVID-19 10/31/2019   04/2019   Pyelonephritis     Social History: Social History   Socioeconomic History   Marital status: Married    Spouse name: Not on file   Number of children: Not on file   Years of education: Not on file   Highest education level: Not on file  Occupational History   Not on file  Tobacco Use   Smoking status: Former    Types: Cigarettes   Smokeless tobacco: Never  Vaping Use    Vaping Use: Former  Substance and Sexual Activity   Alcohol use: Not Currently    Comment: social   Drug use: Not Currently    Frequency: 7.0 times per week    Types: Marijuana    Comment: last used before 09/19/19    Sexual activity: Yes    Birth control/protection: None, I.U.D.  Other Topics Concern   Not on file  Social History Narrative   Not on file   Social Determinants of Health   Financial Resource Strain: Not on file  Food Insecurity: No Food Insecurity (05/09/2020)   Hunger Vital Sign    Worried About Running Out of Food in the Last Year: Never true    Ran Out of Food in the Last Year: Never true  Transportation Needs: No Transportation Needs (05/09/2020)   PRAPARE - Administrator, Civil Service (Medical): No    Lack of Transportation (Non-Medical): No  Physical Activity: Not on file  Stress: Not on file  Social Connections: Not on file     Assessment: Paula Gamble presents today for STI testing after finding out that her husband had sex with a woman who is HIV+. Her husband has not been tested for HIV and  she is unsure how long this relationship was going on. She reports oral, vaginal and anal sex with her husband but she is no longer with him and does not plan to have further sexual encounters with him. She denies any other STI symptoms including genital pain, redness and discharge. She reports she sometimes gets fungal infection after her period and uses a OTC vaiginal miconizole capsule to treat. Discussed starting PrEP and she is interested in Truvada.  Counseled that Truvada is a one pill once daily regimen with or without food that can prevent HIV. Discussed the importance of taking the medication daily to provide protection and decreased adherence is associated with decreased efficacy. Also discussed how Truvada works to prevent HIV but not other STDs and encouraged the use of condoms. Counseled that Truvada is normally well tolerated, however some patients  experience a "start up syndrome" with nausea, diarrhea, dizziness, and fatigue but that those should resolve soon after starting treatment.  Advised that any nausea can be mitigated by taking it with food. Discussed that we will be periodically checking kidney function throughout treatment. Discussed how our PrEP process works here at the clinic including follow ups and lab monitoring every 3 months. [facility] [address]   Plan: - HIV, RPR, POC pregnancy test, urine/rectal/pharyngeal GC/CT swabs for cytology today - F/u results to see if treatment is needed - F/u HIVab to send prescription for Truvada - Appointment on 10/19/22 scheduled with amanda for PrEP follow up

## 2022-10-19 ENCOUNTER — Ambulatory Visit: Payer: Self-pay | Admitting: Pharmacist

## 2022-10-19 DIAGNOSIS — Z2981 Encounter for HIV pre-exposure prophylaxis: Secondary | ICD-10-CM

## 2022-10-19 DIAGNOSIS — Z113 Encounter for screening for infections with a predominantly sexual mode of transmission: Secondary | ICD-10-CM

## 2022-10-19 DIAGNOSIS — Z23 Encounter for immunization: Secondary | ICD-10-CM

## 2024-03-18 ENCOUNTER — Emergency Department (HOSPITAL_BASED_OUTPATIENT_CLINIC_OR_DEPARTMENT_OTHER)
Admission: EM | Admit: 2024-03-18 | Discharge: 2024-03-18 | Disposition: A | Discharge status: Home / Self Care | Payer: Self-pay | Attending: Emergency Medicine | Admitting: Emergency Medicine

## 2024-03-18 ENCOUNTER — Encounter (HOSPITAL_BASED_OUTPATIENT_CLINIC_OR_DEPARTMENT_OTHER): Payer: Self-pay

## 2024-03-18 ENCOUNTER — Emergency Department (HOSPITAL_BASED_OUTPATIENT_CLINIC_OR_DEPARTMENT_OTHER): Payer: Self-pay

## 2024-03-18 ENCOUNTER — Other Ambulatory Visit: Payer: Self-pay

## 2024-03-18 DIAGNOSIS — D72829 Elevated white blood cell count, unspecified: Secondary | ICD-10-CM | POA: Insufficient documentation

## 2024-03-18 DIAGNOSIS — Z91040 Latex allergy status: Secondary | ICD-10-CM | POA: Insufficient documentation

## 2024-03-18 DIAGNOSIS — N2889 Other specified disorders of kidney and ureter: Secondary | ICD-10-CM | POA: Insufficient documentation

## 2024-03-18 DIAGNOSIS — Z8616 Personal history of COVID-19: Secondary | ICD-10-CM | POA: Insufficient documentation

## 2024-03-18 LAB — COMPREHENSIVE METABOLIC PANEL WITH GFR
ALT: 10 U/L (ref 0–44)
AST: 18 U/L (ref 15–41)
Albumin: 4.4 g/dL (ref 3.5–5.0)
Alkaline Phosphatase: 74 U/L (ref 38–126)
Anion gap: 10 (ref 5–15)
BUN: 10 mg/dL (ref 6–20)
CO2: 27 mmol/L (ref 22–32)
Calcium: 9.1 mg/dL (ref 8.9–10.3)
Chloride: 105 mmol/L (ref 98–111)
Creatinine, Ser: 0.73 mg/dL (ref 0.44–1.00)
GFR, Estimated: >60 mL/min (ref >60)
Glucose, Bld: 104 mg/dL — ABNORMAL HIGH (ref 70–99)
Potassium: 3.8 mmol/L (ref 3.5–5.1)
Sodium: 142 mmol/L (ref 135–145)
Total Bilirubin: 0.3 mg/dL (ref 0.0–1.2)
Total Protein: 6.5 g/dL (ref 6.5–8.1)

## 2024-03-18 LAB — URINALYSIS, ROUTINE W REFLEX MICROSCOPIC
Bilirubin Urine: NEGATIVE
Glucose, UA: NEGATIVE mg/dL
Ketones, ur: NEGATIVE mg/dL
Nitrite: NEGATIVE
Protein, ur: 30 mg/dL — AB
Specific Gravity, Urine: 1.015 (ref 1.005–1.030)
pH: 6.5 (ref 5.0–8.0)

## 2024-03-18 LAB — URINALYSIS, MICROSCOPIC (REFLEX)

## 2024-03-18 LAB — CBC
HCT: 39.6 % (ref 36.0–46.0)
Hemoglobin: 13.1 g/dL (ref 12.0–15.0)
MCH: 31.5 pg (ref 26.0–34.0)
MCHC: 33.1 g/dL (ref 30.0–36.0)
MCV: 95.2 fL (ref 80.0–100.0)
Platelets: 191 K/uL (ref 150–400)
RBC: 4.16 MIL/uL (ref 3.87–5.11)
RDW: 12.4 % (ref 11.5–15.5)
WBC: 11.1 K/uL — ABNORMAL HIGH (ref 4.0–10.5)
nRBC: 0 % (ref 0.0–0.2)

## 2024-03-18 LAB — HCG, SERUM, QUALITATIVE: Preg, Serum: NEGATIVE

## 2024-03-18 LAB — LIPASE, BLOOD: Lipase: 29 U/L (ref 11–51)

## 2024-03-18 MED ORDER — SODIUM CHLORIDE 0.9 % IV BOLUS
1000.0000 mL | Freq: Once | INTRAVENOUS | Status: AC
  Administered 2024-03-18: 1000 mL via INTRAVENOUS

## 2024-03-18 MED ORDER — IOHEXOL 300 MG/ML  SOLN
100.0000 mL | Freq: Once | INTRAMUSCULAR | Status: AC | PRN
  Administered 2024-03-18: 100 mL via INTRAVENOUS

## 2024-03-18 MED ORDER — ONDANSETRON 4 MG PO TBDP: 4.0000 mg | ORAL_TABLET | Freq: Three times a day (TID) | ORAL | 0 refills | Status: AC | PRN

## 2024-03-18 MED ORDER — PROCHLORPERAZINE EDISYLATE 10 MG/2ML IJ SOLN
5.0000 mg | Freq: Once | INTRAMUSCULAR | Status: AC
  Administered 2024-03-18: 5 mg via INTRAVENOUS
  Filled 2024-03-18: qty 2

## 2024-03-18 MED ORDER — ONDANSETRON HCL 4 MG/2ML IJ SOLN
4.0000 mg | Freq: Once | INTRAMUSCULAR | Status: AC | PRN
  Administered 2024-03-18: 4 mg via INTRAVENOUS
  Filled 2024-03-18: qty 2

## 2024-03-18 MED ORDER — CEPHALEXIN 500 MG PO CAPS: 500.0000 mg | ORAL_CAPSULE | Freq: Three times a day (TID) | ORAL | 0 refills | Status: AC

## 2024-03-18 MED ORDER — CEPHALEXIN 250 MG PO CAPS
500.0000 mg | ORAL_CAPSULE | Freq: Once | ORAL | Status: AC
  Administered 2024-03-18: 500 mg via ORAL
  Filled 2024-03-18: qty 2

## 2024-03-18 MED ORDER — HYDROMORPHONE HCL 1 MG/ML IJ SOLN
0.5000 mg | Freq: Once | INTRAMUSCULAR | Status: AC
  Administered 2024-03-18: 0.5 mg via INTRAVENOUS
  Filled 2024-03-18: qty 1

## 2024-03-18 NOTE — ED Provider Notes (Signed)
 [facility] Provider Note  CSN: [id number] Arrival date & time: 03/18/24 9580  Chief Complaint(s) Flank Pain  HPI Paula Gamble is a 28 y.o. female    The history is provided by the patient.  Flank Pain This is a new problem. The problem occurs constantly. Associated symptoms include abdominal pain. Pertinent negatives include no chest pain, no headaches and no shortness of breath. The symptoms are aggravated by walking and bending. Nothing relieves the symptoms. She has tried nothing for the symptoms.    Past Medical History Past Medical History:  Diagnosis Date   Abnormal Pap smear of cervix 01/06/2017   LSIL - Needs repeat 04/2017   COVID-19 04/2019   History of COVID-19 10/31/2019   04/2019   Pyelonephritis    Patient Active Problem List   Diagnosis Date Noted   Liletta  IUD (intrauterine device) placed 05/10/2020 05/10/2020   Gallstones 03/06/2020   Language barrier 10/31/2019   Obesity during pregnancy 10/31/2019   Home Medication(s) Prior to Admission medications   Medication Sig Start Date End Date Taking? Authorizing Provider  cephALEXin  (KEFLEX ) 500 MG capsule Take 1 capsule (500 mg total) by mouth 3 (three) times daily for 14 days. 03/18/24 04/01/24 Yes Brynnlee Cumpian, Raynell Moder, MD  ondansetron  (ZOFRAN -ODT) 4 MG disintegrating tablet Take 1 tablet (4 mg total) by mouth every 8 (eight) hours as needed for up to 3 days for nausea or vomiting. 03/18/24 03/21/24 Yes Melania Kirks, Raynell Moder, MD  diclofenac  Sodium (VOLTAREN ) 1 % GEL Apply 4 g topically 4 (four) times daily. 08/22/21   Neldon Hamp RAMAN, PA  dicyclomine  (BENTYL ) 20 MG tablet Take 1 tablet (20 mg total) by mouth 2 (two) times daily as needed for spasms. 10/14/21   Patt Alm Macho, MD  loperamide  (IMODIUM ) 2 MG capsule Take 1 capsule (2 mg total) by mouth 4 (four) times daily as needed for diarrhea or loose stools. 10/14/21   Patt Alm Macho, MD  methocarbamol   (ROBAXIN ) 500 MG tablet Take 1 tablet (500 mg total) by mouth 2 (two) times daily. 08/22/21   Neldon Hamp RAMAN, PA  ondansetron  (ZOFRAN ) 4 MG tablet Take 1 tablet (4 mg total) by mouth every 6 (six) hours. 10/14/21   Patt Alm Macho, MD  TRUVADA  200-300 MG tablet Take 1 tablet by mouth daily. 07/26/22   Waddell Alan PARAS, RPH-CPP                                                                                                                                    Allergies Latex  Review of Systems Review of Systems  Constitutional:  Negative for fever.  Respiratory:  Negative for shortness of breath.   Cardiovascular:  Negative for chest pain.  Gastrointestinal:  Positive for abdominal pain, nausea and vomiting.  Genitourinary:  Positive for dysuria and flank pain. Negative for pelvic pain, vaginal bleeding and vaginal discharge.  Neurological:  Negative for headaches.   As noted in HPI  Physical Exam Vital Signs  I have reviewed the triage vital signs BP (!) 101/58   Pulse 67   Temp 98.5 F (36.9 C) (Oral)   Resp 16   Ht 5' 5 (1.651 m)   Wt 83 kg   SpO2 98%   BMI 30.45 kg/m   Physical Exam Vitals reviewed.  Constitutional:      General: She is not in acute distress.    Appearance: She is well-developed. She is obese. She is not diaphoretic.  HENT:     Head: Normocephalic and atraumatic.     Right Ear: External ear normal.     Left Ear: External ear normal.     Nose: Nose normal.  Eyes:     General: No scleral icterus.    Conjunctiva/sclera: Conjunctivae normal.  Neck:     Trachea: Phonation normal.  Cardiovascular:     Rate and Rhythm: Normal rate and regular rhythm.  Pulmonary:     Effort: Pulmonary effort is normal. No respiratory distress.     Breath sounds: No stridor.  Abdominal:     General: There is no distension.     Tenderness: There is abdominal tenderness in the right upper quadrant, right lower quadrant and suprapubic area. There is right CVA tenderness.   Musculoskeletal:        General: Normal range of motion.     Cervical back: Normal range of motion.  Neurological:     Mental Status: She is alert and oriented to person, place, and time.  Psychiatric:        Behavior: Behavior normal.     ED Results and Treatments Labs (all labs ordered are listed, but only abnormal results are displayed) Labs Reviewed  COMPREHENSIVE METABOLIC PANEL WITH GFR - Abnormal; Notable for the following components:      Result Value   Glucose, Bld 104 (*)    All other components within normal limits  CBC - Abnormal; Notable for the following components:   WBC 11.1 (*)    All other components within normal limits  URINALYSIS, ROUTINE W REFLEX MICROSCOPIC - Abnormal; Notable for the following components:   Hgb urine dipstick SMALL (*)    Protein, ur 30 (*)    Leukocytes,Ua MODERATE (*)    All other components within normal limits  URINALYSIS, MICROSCOPIC (REFLEX) - Abnormal; Notable for the following components:   Bacteria, UA FEW (*)    All other components within normal limits  LIPASE, BLOOD  HCG, SERUM, QUALITATIVE                                                                                                                         EKG  EKG Interpretation Date/Time:    Ventricular Rate:    PR Interval:    QRS Duration:    QT Interval:    QTC Calculation:   R Axis:      Text Interpretation:  Radiology CT ABDOMEN PELVIS W CONTRAST Result Date: 03/18/2024 EXAM: CT ABDOMEN AND PELVIS WITH CONTRAST 03/18/2024 06:42:30 AM TECHNIQUE: CT of the abdomen and pelvis was performed with the administration of 100 mL of iohexol (OMNIPAQUE) 300 MG/ML solution. Multiplanar reformatted images are provided for review. Automated exposure control, iterative reconstruction, and/or weight-based adjustment of the mA/kV was utilized to reduce the radiation dose to as low as reasonably achievable. COMPARISON: 04/20/2014 CLINICAL HISTORY: RLQ abdominal  pain. Table formatting from the original note was not included.; Images from the original note were not included.; Pt from home via POV with husband. Pt reports R side flank pain starting 1700. Pt report /V x2 yesterday but none since. Pain is 10/10 and intermittent. FINDINGS: LOWER CHEST: No acute abnormality. LIVER: The liver is unremarkable. GALLBLADDER AND BILE DUCTS: Gallbladder is unremarkable. No biliary ductal dilatation. SPLEEN: The spleen is within normal limits in size and appearance. PANCREAS: Pancreas is normal in size and contour without focal lesion or ductal dilatation. ADRENAL GLANDS: Normal size and morphology bilaterally. No nodule, thickening, or hemorrhage. No periadrenal stranding. KIDNEYS, URETERS AND BLADDER: Asymmetric proximal right hydroureter with urothelial enhancement. No significant hydronephrosis. No signs of urinary tract calculi. Partially decompressed urinary bladder with urothelial enhancement and mild wall thickening. No perinephric or periureteral stranding. GI AND BOWEL: Stomach demonstrates no acute abnormality. The appendix is visualized and normal in caliber, without wall thickening, periappendiceal inflammation, or fluid. There is no bowel obstruction. PERITONEUM AND RETROPERITONEUM: No ascites. No free air. VASCULATURE: Aorta is normal in caliber. LYMPH NODES: No lymphadenopathy. REPRODUCTIVE ORGANS: Uterus appears normal. No adnexal mass. BONES AND SOFT TISSUES: Small fat-containing umbilical hernia. No acute osseous abnormality. No focal soft tissue abnormality. IMPRESSION: 1. Asymmetric proximal right hydroureter with urothelial enhancement without significant hydronephrosis or urinary tract calculi, suggestive of ureteritis or ascending urinary tract infection. 2. Partially decompressed urinary bladder with urothelial enhancement and mild wall thickening, suggestive of cystitis. Electronically signed by: Waddell Calk MD 03/18/2024 06:49 AM EDT RP Workstation:  [id number]    Medications Ordered in ED Medications  cephALEXin  (KEFLEX ) capsule 500 mg (has no administration in time range)  ondansetron  (ZOFRAN ) injection 4 mg (4 mg Intravenous Given 03/18/24 0512)  sodium chloride  0.9 % bolus 1,000 mL (0 mLs Intravenous Stopped 03/18/24 0645)  prochlorperazine  (COMPAZINE ) injection 5 mg (5 mg Intravenous Given 03/18/24 0515)  HYDROmorphone  (DILAUDID ) injection 0.5 mg (0.5 mg Intravenous Given 03/18/24 0516)  iohexol (OMNIPAQUE) 300 MG/ML solution 100 mL (100 mLs Intravenous Contrast Given 03/18/24 9356)   Procedures Procedures  (including critical care time) Medical Decision Making / ED Course   Medical Decision Making Amount and/or Complexity of Data Reviewed Labs: ordered. Decision-making details documented in ED Course. Radiology: ordered.  Risk Prescription drug management. Parenteral controlled substances. Decision regarding hospitalization.    Right sided abdomen and flank pain differential diagnosis considered.  Workup below.  CBC with mild leukocytosis.  No anemia.  CMP without significant electrolyte derangements or renal sufficiency.  No evidence of bili obstruction or pancreatitis.  UA questionable for urinary tract infection.  CT scan obtained to rule out appendicitis which was negative for this but did reveal evidence of ascending urinary tract infection. Will treat as such.     Final Clinical Impression(s) / ED Diagnoses Final diagnoses:  Ureteritis due to infection   The patient appears reasonably screened and/or stabilized for discharge and I doubt any other medical condition or other [facility] requiring further screening, evaluation, or treatment in the ED at this  time. I have discussed the findings, Dx and Tx plan with the patient/family who expressed understanding and agree(s) with the plan. Discharge instructions discussed at length. The patient/family was given strict return precautions who verbalized understanding of the  instructions. No further questions at time of discharge.  Disposition: Discharge  Condition: Good  ED Discharge Orders          Ordered    cephALEXin  (KEFLEX ) 500 MG capsule  3 times daily        03/18/24 0718    ondansetron  (ZOFRAN -ODT) 4 MG disintegrating tablet  Every 8 hours PRN        03/18/24 9281             Follow Up: Nicholaus Burnard HERO, MD [address] [place] [address] [phone]  Call  to schedule an appointment for close follow up   This chart was dictated using voice recognition software.  Despite best efforts to proofread,  errors can occur which can change the documentation meaning.    Trine Raynell Moder, MD 03/18/24 [phone]

## 2024-03-18 NOTE — ED Triage Notes (Signed)
 Pt from home via POV with husband.  Pt reports R side flank pain starting 1700.  Pt report /V x2 yesterday but none since.  Pain is 10/10 and intermittent.
